# Patient Record
Sex: Female | Born: 1990 | Race: White | Hispanic: No | Smoking: Current every day smoker
Health system: Southern US, Community
[De-identification: ages and names within clinical notes are randomized; demographics above are authoritative.]

## PROBLEM LIST (undated history)

## (undated) ENCOUNTER — Inpatient Hospital Stay (HOSPITAL_COMMUNITY): Payer: Self-pay

## (undated) DIAGNOSIS — F319 Bipolar disorder, unspecified: Secondary | ICD-10-CM

## (undated) DIAGNOSIS — K5900 Constipation, unspecified: Secondary | ICD-10-CM

## (undated) DIAGNOSIS — N814 Uterovaginal prolapse, unspecified: Secondary | ICD-10-CM

## (undated) DIAGNOSIS — G8929 Other chronic pain: Secondary | ICD-10-CM

## (undated) DIAGNOSIS — M549 Dorsalgia, unspecified: Secondary | ICD-10-CM

## (undated) DIAGNOSIS — J189 Pneumonia, unspecified organism: Secondary | ICD-10-CM

## (undated) HISTORY — PX: APPENDECTOMY: SHX54

## (undated) HISTORY — PX: TUBAL LIGATION: SHX77

## (undated) HISTORY — PX: ABDOMINAL HYSTERECTOMY: SHX81

## (undated) HISTORY — PX: WISDOM TOOTH EXTRACTION: SHX21

---
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
## 2009-03-18 ENCOUNTER — Emergency Department (HOSPITAL_COMMUNITY): Admission: EM | Admit: 2009-03-18 | Discharge: 2009-03-18 | Payer: Self-pay | Admitting: Emergency Medicine

## 2009-04-07 ENCOUNTER — Emergency Department (HOSPITAL_COMMUNITY): Admission: EM | Admit: 2009-04-07 | Discharge: 2009-04-07 | Payer: Self-pay | Admitting: Emergency Medicine

## 2009-04-29 ENCOUNTER — Emergency Department: Payer: Self-pay | Admitting: Emergency Medicine

## 2009-08-04 ENCOUNTER — Emergency Department (HOSPITAL_COMMUNITY): Admission: EM | Admit: 2009-08-04 | Discharge: 2009-08-04 | Payer: Self-pay | Admitting: Emergency Medicine

## 2009-08-06 ENCOUNTER — Emergency Department (HOSPITAL_COMMUNITY): Admission: EM | Admit: 2009-08-06 | Discharge: 2009-08-06 | Payer: Self-pay | Admitting: Emergency Medicine

## 2009-08-20 ENCOUNTER — Emergency Department (HOSPITAL_COMMUNITY): Admission: EM | Admit: 2009-08-20 | Discharge: 2009-08-20 | Payer: Self-pay | Admitting: Emergency Medicine

## 2009-08-23 ENCOUNTER — Emergency Department (HOSPITAL_COMMUNITY): Admission: EM | Admit: 2009-08-23 | Discharge: 2009-08-24 | Payer: Self-pay | Admitting: Emergency Medicine

## 2009-08-29 ENCOUNTER — Ambulatory Visit: Payer: Self-pay | Admitting: Physician Assistant

## 2009-08-29 ENCOUNTER — Inpatient Hospital Stay (HOSPITAL_COMMUNITY): Admission: AD | Admit: 2009-08-29 | Discharge: 2009-08-29 | Payer: Self-pay | Admitting: Family Medicine

## 2009-08-29 ENCOUNTER — Encounter: Admission: RE | Admit: 2009-08-29 | Discharge: 2009-09-15 | Payer: Self-pay | Admitting: Orthopedic Surgery

## 2009-09-04 ENCOUNTER — Emergency Department (HOSPITAL_COMMUNITY): Admission: EM | Admit: 2009-09-04 | Discharge: 2009-09-04 | Payer: Self-pay | Admitting: Emergency Medicine

## 2009-11-23 ENCOUNTER — Emergency Department (HOSPITAL_COMMUNITY): Admission: EM | Admit: 2009-11-23 | Discharge: 2009-11-24 | Payer: Self-pay | Admitting: Emergency Medicine

## 2009-11-28 ENCOUNTER — Emergency Department: Payer: Self-pay | Admitting: Emergency Medicine

## 2009-12-10 ENCOUNTER — Emergency Department (HOSPITAL_COMMUNITY): Admission: EM | Admit: 2009-12-10 | Discharge: 2009-12-10 | Payer: Self-pay | Admitting: Family Medicine

## 2009-12-17 ENCOUNTER — Emergency Department (HOSPITAL_COMMUNITY): Admission: EM | Admit: 2009-12-17 | Discharge: 2009-12-17 | Payer: Self-pay | Admitting: Family Medicine

## 2010-01-24 ENCOUNTER — Emergency Department: Payer: Self-pay | Admitting: Emergency Medicine

## 2010-02-06 ENCOUNTER — Emergency Department: Payer: Self-pay | Admitting: Emergency Medicine

## 2010-04-18 ENCOUNTER — Emergency Department (HOSPITAL_COMMUNITY): Admission: EM | Admit: 2010-04-18 | Discharge: 2010-04-18 | Payer: Self-pay | Admitting: Emergency Medicine

## 2010-09-10 ENCOUNTER — Inpatient Hospital Stay (HOSPITAL_COMMUNITY): Payer: Medicaid Other

## 2010-09-10 ENCOUNTER — Inpatient Hospital Stay (HOSPITAL_COMMUNITY)
Admission: AD | Admit: 2010-09-10 | Discharge: 2010-09-10 | Disposition: A | Discharge status: Home / Self Care | Payer: Medicaid Other | Source: Ambulatory Visit | Attending: Obstetrics and Gynecology | Admitting: Obstetrics and Gynecology

## 2010-09-10 DIAGNOSIS — O210 Mild hyperemesis gravidarum: Secondary | ICD-10-CM | POA: Insufficient documentation

## 2010-09-10 DIAGNOSIS — R109 Unspecified abdominal pain: Secondary | ICD-10-CM

## 2010-09-10 LAB — URINALYSIS, ROUTINE W REFLEX MICROSCOPIC
Bilirubin Urine: NEGATIVE
Hgb urine dipstick: NEGATIVE
Protein, ur: NEGATIVE mg/dL
Urobilinogen, UA: 0.2 mg/dL (ref 0.0–1.0)
pH: 6.5 (ref 5.0–8.0)

## 2010-09-10 LAB — CBC
Hemoglobin: 13.3 g/dL (ref 12.0–15.0)
MCHC: 34 g/dL (ref 30.0–36.0)
Platelets: 195 10*3/uL (ref 150–400)
RDW: 12.4 % (ref 11.5–15.5)

## 2010-09-10 LAB — WET PREP, GENITAL
Clue Cells Wet Prep HPF POC: NONE SEEN
Trich, Wet Prep: NONE SEEN

## 2010-09-10 LAB — HCG, QUANTITATIVE, PREGNANCY: hCG, Beta Chain, Quant, S: 68315 m[IU]/mL — ABNORMAL HIGH (ref <5)

## 2010-09-10 LAB — ABO/RH: ABO/RH(D): O POS

## 2010-10-11 LAB — URINALYSIS, ROUTINE W REFLEX MICROSCOPIC
Bilirubin Urine: NEGATIVE
Protein, ur: NEGATIVE mg/dL

## 2010-10-11 LAB — WET PREP, GENITAL: Yeast Wet Prep HPF POC: NONE SEEN

## 2010-10-11 LAB — POCT I-STAT, CHEM 8
Glucose, Bld: 110 mg/dL — ABNORMAL HIGH (ref 70–99)
Sodium: 141 mEq/L (ref 135–145)
TCO2: 26 mmol/L (ref 0–100)

## 2010-10-11 LAB — POCT PREGNANCY, URINE: Preg Test, Ur: NEGATIVE

## 2010-10-11 LAB — GC/CHLAMYDIA PROBE AMP, GENITAL: GC Probe Amp, Genital: NEGATIVE

## 2010-10-14 LAB — COMPREHENSIVE METABOLIC PANEL
CO2: 26 mEq/L (ref 19–32)
Chloride: 106 mEq/L (ref 96–112)
GFR calc Af Amer: >60 mL/min (ref >60)
GFR calc non Af Amer: >60 mL/min (ref >60)
Glucose, Bld: 91 mg/dL (ref 70–99)
Sodium: 139 mEq/L (ref 135–145)
Total Bilirubin: 0.4 mg/dL (ref 0.3–1.2)
Total Protein: 6.1 g/dL (ref 6.0–8.3)

## 2010-10-14 LAB — DIFFERENTIAL
Lymphocytes Relative: 25 % (ref 12–46)
Lymphs Abs: 1.8 10*3/uL (ref 0.7–4.0)
Monocytes Absolute: 0.6 10*3/uL (ref 0.1–1.0)
Neutro Abs: 4.8 10*3/uL (ref 1.7–7.7)
Neutrophils Relative %: 65 % (ref 43–77)

## 2010-10-14 LAB — CBC: Hemoglobin: 12.7 g/dL (ref 12.0–15.0)

## 2010-10-14 LAB — HERPES SIMPLEX VIRUS CULTURE

## 2010-10-14 LAB — LIPASE, BLOOD: Lipase: 25 U/L (ref 11–59)

## 2010-10-14 LAB — D-DIMER, QUANTITATIVE: D-Dimer, Quant: 0.23 ug/mL-FEU (ref 0.00–0.48)

## 2010-10-15 LAB — POCT URINALYSIS DIP (DEVICE)
Bilirubin Urine: NEGATIVE
Glucose, UA: NEGATIVE mg/dL
Hgb urine dipstick: NEGATIVE
Ketones, ur: NEGATIVE mg/dL
Specific Gravity, Urine: 1.025 (ref 1.005–1.030)

## 2010-10-17 LAB — URINALYSIS, ROUTINE W REFLEX MICROSCOPIC
Glucose, UA: NEGATIVE mg/dL
Hgb urine dipstick: NEGATIVE
Ketones, ur: NEGATIVE mg/dL
Protein, ur: NEGATIVE mg/dL
pH: 6.5 (ref 5.0–8.0)

## 2010-10-17 LAB — DIFFERENTIAL
Eosinophils Absolute: 0.4 10*3/uL (ref 0.0–0.7)
Eosinophils Relative: 8 % — ABNORMAL HIGH (ref 0–5)
Lymphs Abs: 2 10*3/uL (ref 0.7–4.0)
Monocytes Relative: 9 % (ref 3–12)

## 2010-10-17 LAB — CBC
HCT: 37.6 % (ref 36.0–46.0)
Hemoglobin: 13 g/dL (ref 12.0–15.0)
MCV: 88.1 fL (ref 78.0–100.0)
RBC: 4.27 MIL/uL (ref 3.87–5.11)
WBC: 5.6 10*3/uL (ref 4.0–10.5)

## 2010-10-17 LAB — PREGNANCY, URINE: Preg Test, Ur: NEGATIVE

## 2010-10-18 LAB — URINE CULTURE: Colony Count: 5000

## 2010-10-18 LAB — URINALYSIS, ROUTINE W REFLEX MICROSCOPIC
Bilirubin Urine: NEGATIVE
Glucose, UA: NEGATIVE mg/dL
Ketones, ur: NEGATIVE mg/dL
Protein, ur: 30 mg/dL — AB

## 2010-10-18 LAB — WET PREP, GENITAL
Trich, Wet Prep: NONE SEEN
Yeast Wet Prep HPF POC: NONE SEEN

## 2010-10-18 LAB — URINE MICROSCOPIC-ADD ON

## 2010-10-18 LAB — GC/CHLAMYDIA PROBE AMP, GENITAL: Chlamydia, DNA Probe: NEGATIVE

## 2010-11-03 LAB — COMPREHENSIVE METABOLIC PANEL
ALT: 40 U/L — ABNORMAL HIGH (ref 0–35)
Alkaline Phosphatase: 72 U/L (ref 39–117)
BUN: 8 mg/dL (ref 6–23)
Chloride: 108 mEq/L (ref 96–112)
Glucose, Bld: 109 mg/dL — ABNORMAL HIGH (ref 70–99)
Potassium: 3.8 mEq/L (ref 3.5–5.1)
Sodium: 139 mEq/L (ref 135–145)
Total Bilirubin: 0.5 mg/dL (ref 0.3–1.2)
Total Protein: 6.5 g/dL (ref 6.0–8.3)

## 2010-11-03 LAB — URINALYSIS, ROUTINE W REFLEX MICROSCOPIC
Bilirubin Urine: NEGATIVE
Bilirubin Urine: NEGATIVE
Glucose, UA: NEGATIVE mg/dL
Ketones, ur: NEGATIVE mg/dL
Nitrite: NEGATIVE
Protein, ur: NEGATIVE mg/dL
Protein, ur: 30 mg/dL — AB
Specific Gravity, Urine: 1.018 (ref 1.005–1.030)
Urobilinogen, UA: 0.2 mg/dL (ref 0.0–1.0)
Urobilinogen, UA: 0.2 mg/dL (ref 0.0–1.0)

## 2010-11-03 LAB — DIFFERENTIAL
Basophils Absolute: 0 10*3/uL (ref 0.0–0.1)
Basophils Relative: 1 % (ref 0–1)
Eosinophils Absolute: 0.4 10*3/uL (ref 0.0–0.7)
Monocytes Absolute: 0.5 10*3/uL (ref 0.1–1.0)
Monocytes Relative: 7 % (ref 3–12)
Neutro Abs: 5 10*3/uL (ref 1.7–7.7)
Neutrophils Relative %: 68 % (ref 43–77)

## 2010-11-03 LAB — CBC
HCT: 37.7 % (ref 36.0–46.0)
Hemoglobin: 12.7 g/dL (ref 12.0–15.0)
RBC: 4.31 MIL/uL (ref 3.87–5.11)
WBC: 7.4 10*3/uL (ref 4.0–10.5)

## 2010-11-03 LAB — URINE MICROSCOPIC-ADD ON

## 2010-11-21 ENCOUNTER — Emergency Department (HOSPITAL_COMMUNITY): Payer: Medicaid Other

## 2010-11-21 ENCOUNTER — Emergency Department (HOSPITAL_COMMUNITY)
Admission: EM | Admit: 2010-11-21 | Discharge: 2010-11-21 | Disposition: A | Discharge status: Home / Self Care | Payer: Medicaid Other | Attending: Emergency Medicine | Admitting: Emergency Medicine

## 2010-11-21 DIAGNOSIS — F319 Bipolar disorder, unspecified: Secondary | ICD-10-CM | POA: Insufficient documentation

## 2010-11-21 DIAGNOSIS — N949 Unspecified condition associated with female genital organs and menstrual cycle: Secondary | ICD-10-CM | POA: Insufficient documentation

## 2010-11-21 DIAGNOSIS — A499 Bacterial infection, unspecified: Secondary | ICD-10-CM | POA: Insufficient documentation

## 2010-11-21 DIAGNOSIS — N760 Acute vaginitis: Secondary | ICD-10-CM | POA: Insufficient documentation

## 2010-11-21 DIAGNOSIS — O2390 Unspecified genitourinary tract infection in pregnancy, unspecified trimester: Secondary | ICD-10-CM | POA: Insufficient documentation

## 2010-11-21 DIAGNOSIS — R109 Unspecified abdominal pain: Secondary | ICD-10-CM | POA: Insufficient documentation

## 2010-11-21 DIAGNOSIS — B9689 Other specified bacterial agents as the cause of diseases classified elsewhere: Secondary | ICD-10-CM | POA: Insufficient documentation

## 2010-11-21 LAB — URINE MICROSCOPIC-ADD ON

## 2010-11-21 LAB — POCT PREGNANCY, URINE: Preg Test, Ur: POSITIVE

## 2010-11-21 LAB — URINALYSIS, ROUTINE W REFLEX MICROSCOPIC
Glucose, UA: NEGATIVE mg/dL
Nitrite: NEGATIVE
Specific Gravity, Urine: 1.02 (ref 1.005–1.030)
pH: 7.5 (ref 5.0–8.0)

## 2010-11-21 LAB — GC/CHLAMYDIA PROBE AMP, GENITAL
Chlamydia, DNA Probe: NEGATIVE
GC Probe Amp, Genital: NEGATIVE

## 2010-11-21 LAB — WET PREP, GENITAL: Trich, Wet Prep: NONE SEEN

## 2010-11-22 LAB — URINE CULTURE: Culture  Setup Time: 201204250939

## 2010-11-27 ENCOUNTER — Inpatient Hospital Stay (HOSPITAL_COMMUNITY)
Admission: AD | Admit: 2010-11-27 | Discharge: 2010-11-27 | Disposition: A | Discharge status: Home / Self Care | Payer: Medicaid Other | Source: Ambulatory Visit | Attending: Obstetrics and Gynecology | Admitting: Obstetrics and Gynecology

## 2010-11-27 ENCOUNTER — Inpatient Hospital Stay (HOSPITAL_COMMUNITY): Payer: Medicaid Other

## 2010-11-27 DIAGNOSIS — R109 Unspecified abdominal pain: Secondary | ICD-10-CM | POA: Insufficient documentation

## 2010-11-27 DIAGNOSIS — O99891 Other specified diseases and conditions complicating pregnancy: Secondary | ICD-10-CM | POA: Insufficient documentation
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
## 2011-05-02 ENCOUNTER — Emergency Department (HOSPITAL_COMMUNITY)
Admission: EM | Admit: 2011-05-02 | Discharge: 2011-05-02 | Disposition: A | Discharge status: Home / Self Care | Payer: Medicaid Other | Attending: Emergency Medicine | Admitting: Emergency Medicine

## 2011-05-02 ENCOUNTER — Emergency Department (HOSPITAL_COMMUNITY): Payer: Medicaid Other

## 2011-05-02 DIAGNOSIS — B9789 Other viral agents as the cause of diseases classified elsewhere: Secondary | ICD-10-CM | POA: Insufficient documentation

## 2011-05-02 DIAGNOSIS — F319 Bipolar disorder, unspecified: Secondary | ICD-10-CM | POA: Insufficient documentation

## 2011-05-02 DIAGNOSIS — IMO0001 Reserved for inherently not codable concepts without codable children: Secondary | ICD-10-CM | POA: Insufficient documentation

## 2011-05-02 DIAGNOSIS — R509 Fever, unspecified: Secondary | ICD-10-CM | POA: Insufficient documentation

## 2011-05-02 LAB — URINALYSIS, ROUTINE W REFLEX MICROSCOPIC
Bilirubin Urine: NEGATIVE
Nitrite: NEGATIVE
Specific Gravity, Urine: 1.014 (ref 1.005–1.030)
pH: 7.5 (ref 5.0–8.0)

## 2011-05-02 LAB — POCT PREGNANCY, URINE: Preg Test, Ur: NEGATIVE

## 2011-05-02 LAB — URINE MICROSCOPIC-ADD ON

## 2011-05-03 ENCOUNTER — Emergency Department (HOSPITAL_COMMUNITY)
Admission: EM | Admit: 2011-05-03 | Discharge: 2011-05-04 | Disposition: A | Discharge status: Home / Self Care | Payer: Medicaid Other | Attending: Emergency Medicine | Admitting: Emergency Medicine

## 2011-05-03 DIAGNOSIS — F319 Bipolar disorder, unspecified: Secondary | ICD-10-CM | POA: Insufficient documentation

## 2011-05-03 DIAGNOSIS — A779 Spotted fever, unspecified: Secondary | ICD-10-CM | POA: Insufficient documentation

## 2011-05-03 DIAGNOSIS — R21 Rash and other nonspecific skin eruption: Secondary | ICD-10-CM | POA: Insufficient documentation

## 2011-09-16 IMAGING — CR DG CHEST 2V
2 series · 2 of 2 positions shown · non-contrast
Comparison: 03/18/2009

CLINICAL DATA: Coughing up blood, rib pain

CHEST - 2 VIEW

[w chest pa]
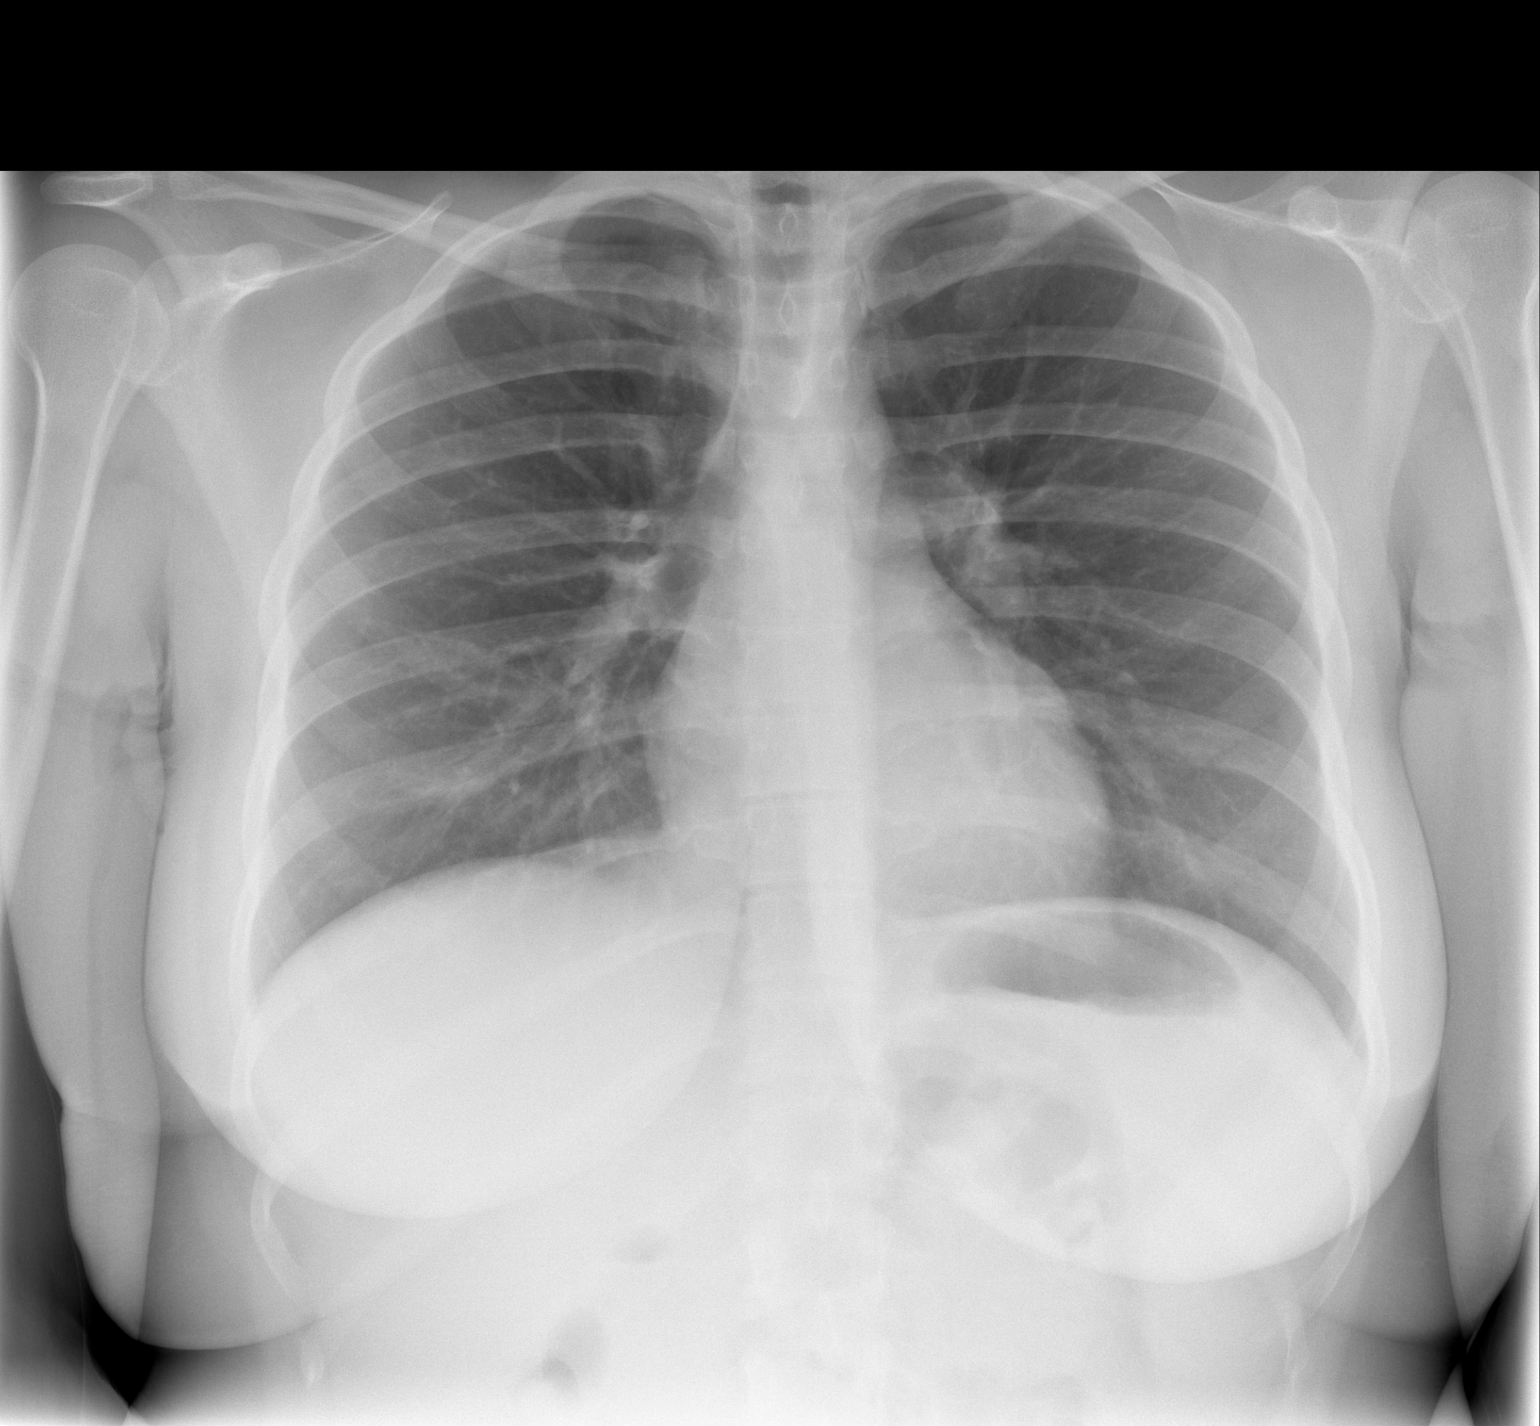

[w chest lat]
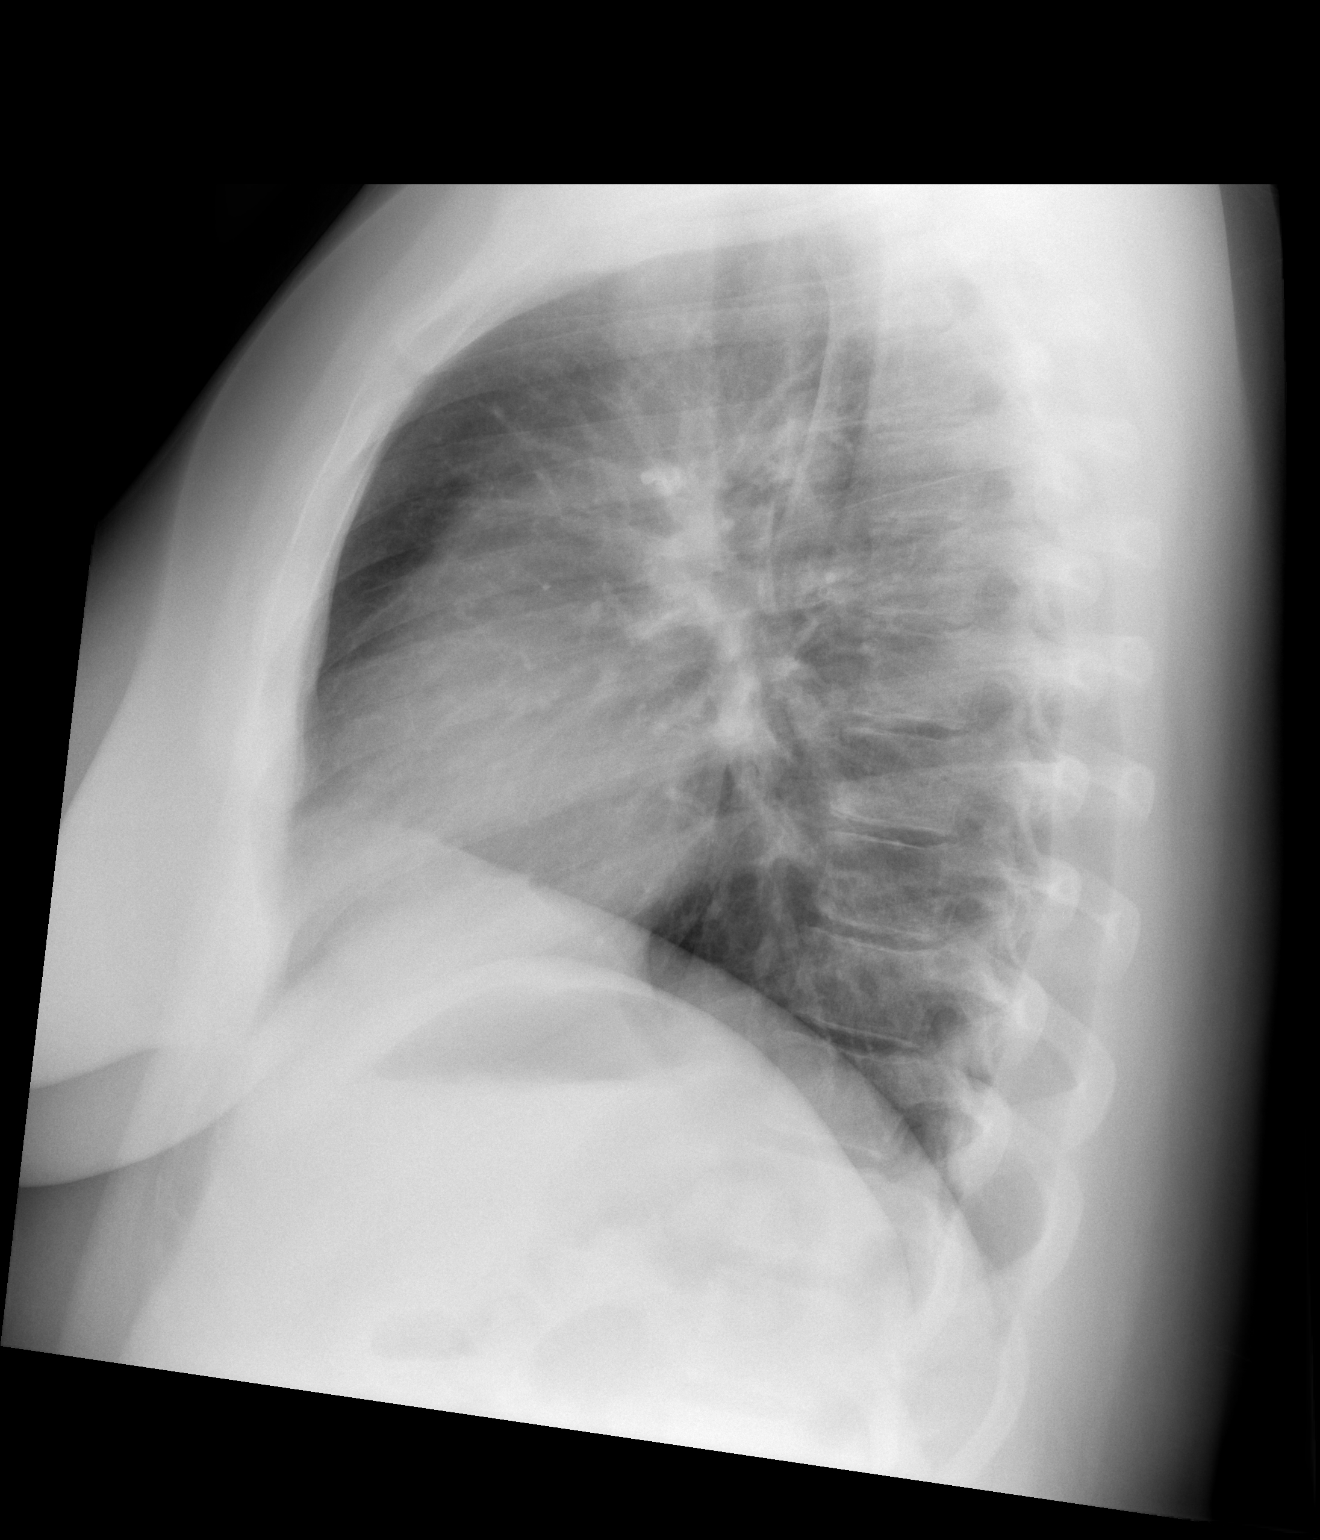

[2 of 2 positions shown; findings below may reference images not displayed]

FINDINGS: The cardiac and mediastinal silhouette appear
unremarkable.  The lung fields are clear.  There is no bony
abnormality.  There is no change from priors.
IMPRESSION: No active cardiopulmonary disease.

## 2012-01-02 ENCOUNTER — Emergency Department (HOSPITAL_COMMUNITY)
Admission: EM | Admit: 2012-01-02 | Discharge: 2012-01-03 | Disposition: A | Discharge status: Home / Self Care | Payer: Medicaid Other | Attending: Emergency Medicine | Admitting: Emergency Medicine

## 2012-01-02 ENCOUNTER — Encounter (HOSPITAL_COMMUNITY): Payer: Self-pay | Admitting: Emergency Medicine

## 2012-01-02 DIAGNOSIS — Y92410 Unspecified street and highway as the place of occurrence of the external cause: Secondary | ICD-10-CM | POA: Insufficient documentation

## 2012-01-02 DIAGNOSIS — M25519 Pain in unspecified shoulder: Secondary | ICD-10-CM | POA: Insufficient documentation

## 2012-01-02 DIAGNOSIS — G8929 Other chronic pain: Secondary | ICD-10-CM | POA: Insufficient documentation

## 2012-01-02 DIAGNOSIS — T148XXA Other injury of unspecified body region, initial encounter: Secondary | ICD-10-CM

## 2012-01-02 DIAGNOSIS — F319 Bipolar disorder, unspecified: Secondary | ICD-10-CM | POA: Insufficient documentation

## 2012-01-02 DIAGNOSIS — M25529 Pain in unspecified elbow: Secondary | ICD-10-CM | POA: Insufficient documentation

## 2012-01-02 DIAGNOSIS — E119 Type 2 diabetes mellitus without complications: Secondary | ICD-10-CM | POA: Insufficient documentation

## 2012-01-02 HISTORY — DX: Dorsalgia, unspecified: M54.9

## 2012-01-02 HISTORY — DX: Other chronic pain: G89.29

## 2012-01-02 HISTORY — DX: Bipolar disorder, unspecified: F31.9
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
## 2012-01-03 ENCOUNTER — Emergency Department (HOSPITAL_COMMUNITY): Payer: No Typology Code available for payment source

## 2012-01-03 MED ORDER — OXYCODONE-ACETAMINOPHEN 5-325 MG PO TABS
1.0000 | ORAL_TABLET | Freq: Once | ORAL | Status: AC
  Administered 2012-01-03: 1 via ORAL
  Filled 2012-01-03: qty 1

## 2012-01-03 MED ORDER — IBUPROFEN 200 MG PO TABS
400.0000 mg | ORAL_TABLET | Freq: Once | ORAL | Status: AC
  Administered 2012-01-03: 400 mg via ORAL
  Filled 2012-01-03: qty 2
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
## 2012-01-03 NOTE — ED Notes (Signed)
Patient transported to X-ray, will adminster meds on return

## 2012-01-03 NOTE — Discharge Instructions (Signed)
Take tylenol or ibuprofen as needed for pain. Muscle Strain A muscle strain (pulled muscle) happens when a muscle is over-stretched. Recovery usually takes 5 to 6 weeks.  HOME CARE   Put ice on the injured area.   Put ice in a plastic bag.   Place a towel between your skin and the bag.   Leave the ice on for 15 to 20 minutes at a time, every hour for the first 2 days.   Do not use the muscle for several days or until your doctor says you can. Do not use the muscle if you have pain.   Wrap the injured area with an elastic bandage for comfort. Do not put it on too tightly.   Only take medicine as told by your doctor.   Warm up before exercise. This helps prevent muscle strains.  GET HELP RIGHT AWAY IF:  There is increased pain or puffiness (swelling) in the affected area. MAKE SURE YOU:   Understand these instructions.   Will watch your condition.   Will get help right away if you are not doing well or get worse.  Document Released: 04/23/2008 Document Revised: 07/04/2011 Document Reviewed: 04/23/2008 [facility] Patient Information 2012 [place], [place].  RESOURCE GUIDE  Chronic Pain Problems: Contact [facility]  [phone] Patients need to be referred by their primary care doctor.  Insufficient Money for Medicine: Contact United Way:  call "211" or [facility] 607-182-4170.  No Primary Care Doctor: - Call Health Connect  [phone] - can help you locate a primary care doctor that  accepts your insurance, provides certain services, etc. - Physician Referral [phone]  Agencies that provide inexpensive medical care: - [facility]  [phone] - [facility]  [phone] - [facility]  [phone] [facility]  [phone] - Planned Parenthood  [phone] [facility]  [phone]  Medicaid-accepting [facility] Providers: - [facility]- [address]  [phone], Mon-Fri 9am-7pm, Sat 9am-1pm - [facility]- [address] [place], Suite [place]  [phone] - [facility]- [address] [place]  [phone] [facility]- [address]  [phone] - Renaye Rakers- [address] [place], [address], [phone]  Only accepts [place] patients after they have their name  applied to their card  Self Pay (no insurance) in [place]: - Sickle Cell Patients: Dr Willey Blade, [facility]  [address] [place], [phone] - [facility]- [address] [place]  [phone]       [facility] [place]- [address]       -     [facility]- see information above (Speak to [employer] if you do not have insurance)       -  [facility]- [address] [place], [phone]       -  [facility]- [address],  [phone]       -  [facility]- [address] [phone]       -  Dr Julio Sicks-  [address], [address], [place], [phone]       -  [facility]- [address] [phone]       -  [facility]- [address],  [phone], also [address], [phone]       -    [facility]- [address] [place], [phone], 1st & 3rd Saturday   every month, 10am-1pm  1) Find a Doctor and Pay Out of Pocket Although you won't have to find out who is covered by your insurance plan, it is a good idea to ask around and get recommendations. You will then need to call the office and see if the doctor you have chosen will accept you as a new patient and what types of options they offer for patients who are self-pay. Some doctors offer discounts or will set up payment plans for their patients who do not have insurance, but you will need to ask so you aren't surprised when you get to your appointment.  2) Contact Your Local Health Department Not all health departments have  doctors that can see patients for sick visits, but many do, so it is worth a call to see if yours does. If you don't know where your local health department is, you can check in your phone book. The CDC also has a tool to help you locate your state's health department, and many state websites also have listings of all of their local health departments.  3) Find a Walk-in Clinic If your illness is not likely to be very severe or complicated, you may want to try a walk in clinic. These are popping up all over the country in pharmacies, drugstores, and shopping centers. They're usually staffed by nurse practitioners or physician assistants that have been trained to treat common illnesses and complaints. They're usually fairly quick and inexpensive. However, if you have serious medical issues or chronic medical problems, these are probably not your best option  STD Testing - [facility] Department of [facility], STD Clinic, [address] [place], phone [phone] or [phone].  Monday - Friday, call for an appointment. [facility] Department of [employer], STD Clinic, [place] [address], [place], phone [phone] or [phone].  Monday - Friday, call for an appointment.  Abuse/Neglect: [facility] Child Abuse Hotline (312)461-5058 [facility] Child Abuse Hotline 435-044-6143 (After Hours)  Emergency Shelter:  [facility] 928-176-7859  Maternity Homes: - [facility] at [facility] [place] [facility] 437-038-9279 - [facility] [phone]  MRSA Hotline #:   914-842-5064  [facility] Resources  [facility] [place]  United Way [facility] Dept. [address]                 [address]         [address] [place] [address]  [place]                                               Cristobal Goldmann Phone:  [phone]                                  Phone:   [phone]  Phone:  [phone]  [facility], [phone] - [phone]       -     [facility] in [place], [address]                                  [phone], [facility] Child Abuse Hotline 830-663-0123 or 223-129-8076 (After Hours)   Behavioral Health Services  Substance Abuse Resources: - Alcohol and Drug Services  2251121377 - [facility] [phone] - [facility] [place] [phone] Floydene Flock [phone] - Residential & Outpatient Substance Abuse Program  [phone]  Psychological Services: [facility]  [phone] [facility] Services  [phone] - [facility], [phone] [place]. [address], [place], ACCESS LINE: 765-041-3383 or 813-675-6155, [email or web address]  Dental Assistance  If unable to pay or uninsured, contact:  [facility] or [facility]. to become qualified for the adult dental clinic.  Patients with Medicaid: [facility] [phone] [address], [phone] [address], [phone]  If unable to pay, or uninsured, contact [facility] [phone]) or [facility] Department [phone] in [place], [phone] in [facility]) to become qualified for the adult dental clinic  Other Low-Cost Community Dental Services: - [facility]- [address] [place], [place], [place], [address], [phone], Ext. [phone], 2nd and 4th Thursday of the month at 6:30am.  10 clients each day by appointment, can sometimes see walk-in patients if someone does not show for an appointment. [facility]- [address] Ether Griffins [place], [place], [address], [phone] - [facility]- [address], [place], [place], [address], [phone] - [facility]- [phone] [facility]  Department- [phone] [facility] Department- [phone]

## 2012-01-08 ENCOUNTER — Emergency Department (HOSPITAL_COMMUNITY)
Admission: EM | Admit: 2012-01-08 | Discharge: 2012-01-08 | Disposition: A | Discharge status: Home / Self Care | Payer: Self-pay | Attending: Emergency Medicine | Admitting: Emergency Medicine

## 2012-01-08 ENCOUNTER — Encounter (HOSPITAL_COMMUNITY): Payer: Self-pay | Admitting: Emergency Medicine

## 2012-01-08 DIAGNOSIS — M549 Dorsalgia, unspecified: Secondary | ICD-10-CM | POA: Insufficient documentation

## 2012-01-08 DIAGNOSIS — F319 Bipolar disorder, unspecified: Secondary | ICD-10-CM | POA: Insufficient documentation

## 2012-01-08 DIAGNOSIS — M79606 Pain in leg, unspecified: Secondary | ICD-10-CM

## 2012-01-08 DIAGNOSIS — M79609 Pain in unspecified limb: Secondary | ICD-10-CM | POA: Insufficient documentation

## 2012-01-08 DIAGNOSIS — F17200 Nicotine dependence, unspecified, uncomplicated: Secondary | ICD-10-CM | POA: Insufficient documentation

## 2012-01-08 DIAGNOSIS — E119 Type 2 diabetes mellitus without complications: Secondary | ICD-10-CM | POA: Insufficient documentation

## 2012-01-08 DIAGNOSIS — G8929 Other chronic pain: Secondary | ICD-10-CM | POA: Insufficient documentation

## 2012-01-08 LAB — POCT I-STAT, CHEM 8
Calcium, Ion: 1.24 mmol/L (ref 1.12–1.32)
Chloride: 103 mEq/L (ref 96–112)
HCT: 43 % (ref 36.0–46.0)
TCO2: 24 mmol/L (ref 0–100)

## 2012-01-08 MED ORDER — HYDROCODONE-ACETAMINOPHEN 5-325 MG PO TABS
1.0000 | ORAL_TABLET | Freq: Once | ORAL | Status: AC
  Administered 2012-01-08: 1 via ORAL
  Filled 2012-01-08: qty 1

## 2012-01-08 MED ORDER — HYDROCODONE-ACETAMINOPHEN 5-325 MG PO TABS: 1.0000 | ORAL_TABLET | Freq: Four times a day (QID) | ORAL | Status: DC | PRN

## 2012-01-08 NOTE — ED Provider Notes (Signed)
History     CSN: [id number]  Arrival date & time 01/08/12  0250   First MD Initiated Contact with Patient 01/08/12 [phone]      Chief Complaint  Patient presents with  . Leg Pain    HPI  History provided by the patient. Patient is a 21 year old female with history of bipolar disorder and chronic back pains who presents with complaints of bilateral lower extremity pains that began last evening. Pain came on acutely and has gradually worsened. Pain is described as an aching throbbing pain that begins above the knees and travels down to the feet. Patient denies any numbness or tingling sensations. She denies any weakness in legs. Patient denies having similar symptoms previously. She denies any aggravating or alleviating factors. Pain is not better at rest and not made worse with any movements or walking. Patient denies any skin changes or rash. Patient denies any fever, chills, sweats.   Past Medical History  Diagnosis Date  . Diabetes mellitus   . Bipolar 1 disorder   . Back pain, chronic     Past Surgical History  Procedure Date  . Appendectomy     No family history on file.  History  Substance Use Topics  . Smoking status: Current Everyday Smoker  . Smokeless tobacco: Not on file  . Alcohol Use: No    OB History    Grav Para Term Preterm Abortions TAB SAB Ect Mult Living                  Review of Systems  Constitutional: Negative for fever and chills.  Gastrointestinal: Negative for nausea, vomiting and abdominal pain.  Musculoskeletal: Negative for back pain.  Neurological: Negative for weakness and numbness.    Allergies  Review of patient's allergies indicates no known allergies.  Home Medications   Current Outpatient Rx  Name Route Sig Dispense Refill  . CITALOPRAM HYDROBROMIDE 40 MG PO TABS Oral Take 40 mg by mouth at bedtime.    . TRAZODONE HCL 100 MG PO TABS Oral Take 50-100 mg by mouth 2 (two) times daily as needed. For insomnia      BP 112/64   Pulse 77  Temp 98.4 F (36.9 C) (Oral)  Resp 17  Ht 5\' 3"  (1.6 m)  Wt 200 lb (90.719 kg)  BMI 35.43 kg/m2  SpO2 98%  LMP 12/13/2011  Physical Exam  Nursing note and vitals reviewed. Constitutional: She is oriented to person, place, and time. She appears well-developed and well-nourished. No distress.  HENT:  Head: Normocephalic.  Cardiovascular: Normal rate and regular rhythm.   Pulmonary/Chest: Effort normal and breath sounds normal. No respiratory distress. She has no wheezes. She has no rales.  Musculoskeletal: Normal range of motion. She exhibits no edema.       Mild tenderness along bilateral lower extremities and dorsal feet. No swelling present. Normal sensations and dorsal pedal pulses bilaterally.  Neurological: She is alert and oriented to person, place, and time.  Skin: Skin is warm and dry.  Psychiatric: She has a normal mood and affect. Her behavior is normal.    ED Course  Procedures  Results for orders placed during the hospital encounter of 01/08/12  POCT I-STAT, CHEM 8      Component Value Range   Sodium 139  135 - 145 mEq/L   Potassium 4.0  3.5 - 5.1 mEq/L   Chloride 103  96 - 112 mEq/L   BUN 11  6 - 23 mg/dL   Creatinine,  Ser 0.90  0.50 - 1.10 mg/dL   Glucose, Bld 161 (*) 70 - 99 mg/dL   Calcium, Ion 0.96  0.45 - 1.32 mmol/L   TCO2 24  0 - 100 mmol/L   Hemoglobin 14.6  12.0 - 15.0 g/dL   HCT 40.9  81.1 - 91.4 %       1. Lower extremity pain       MDM  Patient seen and evaluated. Patient no acute distress.        Angus Seller, [place] 01/09/12 [phone]

## 2012-01-08 NOTE — ED Notes (Signed)
Patient is AOx4 and comfortable with her discharge instructions. 

## 2012-01-08 NOTE — Discharge Instructions (Signed)
You were seen and evaluated for your complaints lower leg pain. Your blood tests do not show any concerns of your electrolytes. At this time your providers feel you may return home and rest or legs. Please followup with the primary care provider for continued evaluation and treatment.   Pain of Unknown Etiology (Pain Without a Known Cause) You have come to your caregiver because of pain. Pain can occur in any part of the body. Often there is not a definite cause. If your laboratory (blood or urine) work was normal and x-rays or other studies were normal, your caregiver may treat you without knowing the cause of the pain. An example of this is the headache. Most headaches are diagnosed by taking a history. This means your caregiver asks you questions about your headaches. Your caregiver determines a treatment based on your answers. Usually testing done for headaches is normal. Often testing is not done unless there is no response to medications. Regardless of where your pain is located today, you can be given medications to make you comfortable. If no physical cause of pain can be found, most cases of pain will gradually leave as suddenly as they came.  If you have a painful condition and no reason can be found for the pain, It is importantthat you follow up with your caregiver. If the pain becomes worse or does not go away, it may be necessary to repeat tests and look further for a possible cause.  Only take over-the-counter or prescription medicines for pain, discomfort, or fever as directed by your caregiver.   For the protection of your privacy, test results can not be given over the phone. Make sure you receive the results of your test. Ask as to how these results are to be obtained if you have not been informed. It is your responsibility to obtain your test results.   You may continue all activities unless the activities cause more pain. When the pain lessens, it is important to gradually resume  normal activities. Resume activities by beginning slowly and gradually increasing the intensity and duration of the activities or exercise. During periods of severe pain, bed-rest may be helpful. Lay or sit in any position that is comfortable.   Ice used for acute (sudden) conditions may be effective. Use a large plastic bag filled with ice and wrapped in a towel. This may provide pain relief.   See your caregiver for continued problems. They can help or refer you for exercises or physical therapy if necessary.  If you were given medications for your condition, do not drive, operate machinery or power tools, or sign legal documents for 24 hours. Do not drink alcohol, take sleeping pills, or take other medications that may interfere with treatment. See your caregiver immediately if you have pain that is becoming worse and not relieved by medications. Document Released: 04/09/2001 Document Revised: 07/04/2011 Document Reviewed: 07/15/2005 [facility] Patient Information 2012 [place], [place].    RESOURCE GUIDE  Chronic Pain Problems: Contact [facility]  [phone] Patients need to be referred by their primary care doctor.  Insufficient Money for Medicine: Contact United Way:  call "211" or [facility] (559)[phone].  No Primary Care Doctor: - Call Health Connect  [phone] - can help you locate a primary care doctor that  accepts your insurance, provides certain services, etc. - Physician Referral [phone]  Agencies that provide inexpensive medical care: - [facility]  [phone] - [facility]  [phone] - [facility]  [phone] - Women's Clinic  [phone] - Planned Parenthood  [phone] - [facility]  [phone]  Medicaid-accepting [facility] Providers: - [facility]- [address]  [phone], Mon-Fri 9am-7pm, Sat 9am-1pm - [facility]- [address] [place], Suite [place]  [phone] - [facility]- [address], Suite [place]  [phone] [facility]- [address]  [phone] - Renaye Rakers- [address] [place], [address], [phone]  Only accepts [place] patients after they have their name  applied to their card  Self Pay (no insurance) in [place]: - Sickle Cell Patients: Dr Willey Blade, [facility]  [address] [place], [phone] - [facility]- [address] [place]  [phone]       [facility] [place]- [address]       -     [facility]- see information above (Speak to [employer] if you do not have insurance)       -  [facility]- [address] [place], [phone]       -  [facility]- [address] [place],  [phone]       -  [facility]- [address], [phone]       -  Dr Julio Sicks-  [address], [place], [phone]       -  [facility]- [address] [phone]       -  [facility]- [address] [phone], also [address] [phone]       -    [facility]- [address] [place], [phone], 1st & 3rd Saturday   every month, 10am-1pm  1) Find a Doctor and Pay Out of Pocket Although you won't have to find out who is covered by your insurance plan, it is a good idea to ask around and get recommendations. You will then need to call the office and see if the doctor you have chosen will accept you as a new patient and what types of options they offer for patients who are self-pay. Some doctors offer discounts or will set up payment plans for their patients who do not have insurance, but you will need to ask so you aren't surprised when you get to your appointment.  2) Contact Your Local Health Department Not all health departments have doctors that can see patients for sick visits, but many do, so it is worth a  call to see if yours does. If you don't know where your local health department is, you can check in your phone book. The CDC also has a tool to help you locate your state's health department, and many state websites also have listings of all of their local health departments.  3) Find a Walk-in Clinic If your illness is not likely to be very severe or complicated, you may want to try a walk in clinic. These are popping up all over the country in pharmacies, drugstores, and shopping centers. They're usually staffed by nurse practitioners or physician assistants that have been trained to treat common illnesses and complaints. They're usually fairly quick and inexpensive. However, if you have serious medical issues or chronic medical problems, these are probably not your best option  STD Testing - [facility] Department of [facility], STD Clinic, [address] [place], phone [phone] or [phone].  Monday - Friday, call for an appointment. [facility] Department of [employer], STD Clinic, [place] [address], [place], phone [phone] or [phone].  Monday - Friday, call for an appointment.  Abuse/Neglect: [facility] Child Abuse Hotline (743) 426-2845 [facility] Child Abuse Hotline 416-836-6212 (After Hours)  Emergency Shelter:  [facility] 6500539188  Maternity Homes: - [facility] [place] [facility] [phone] - [facility] [phone]  MRSA Hotline #:   8301655322  [facility] Resources  [facility] [place]  [employer] [facility] Dept. [address]                 [address]         [address] [place] [address]  Blondell Reveal Phone:  [phone]                                  Phone:  [phone]                   Phone:  [phone]  [facility] Health,  [phone] - [phone]       -     [facility] in [place], [address],                                  412-193-0368, [facility] Child Abuse Hotline 614-186-2972 or 317-870-9975 (After Hours)   Behavioral Health Services  Substance Abuse Resources: - Alcohol and Drug Services  9042385209 - [facility] 701-329-2708 - [facility] [place] [phone] Floydene Flock (606)[phone] - Residential & Outpatient Substance Abuse Program  [phone]  Psychological Services: [facility]  [phone] Services  [phone] - [facility], [phone] [place]. [address], [place], ACCESS LINE: 973-576-0662 or [phone], [email or web address]  Dental Assistance  If unable to pay or uninsured, contact:  [facility] or [facility] to become qualified for the adult dental clinic.  Patients with Medicaid: [facility] [phone] [address], [phone] [address] [phone]  If unable to pay, or uninsured, contact [facility] [phone]) or [facility] Department [phone] in [place], [phone] in [facility]) to become qualified for the adult dental clinic  Other Low-Cost Community Dental Services: - [facility]- [address] [place], [place], [place], [address], [phone], Ext. [phone], 2nd and 4th Thursday of the month at 6:30am.  10 clients each day by appointment, can sometimes see walk-in patients if someone does not show for an appointment. [facility]- [address] Ether Griffins [place], [place], [address], [phone] - [facility]-  [address] [place], [place], [address], [phone] [facility] Health Department- [phone] [facility] Health Department- [phone] [facility] Department- [phone]

## 2012-01-08 NOTE — ED Notes (Signed)
PT. REPORTS BILATERAL ,LOWER LEG PAIN ONSET THIS EVENING , DENIES INJURY , AMBULATORY.

## 2012-01-10 NOTE — ED Provider Notes (Signed)
Medical screening examination/treatment/procedure(s) were performed by non-physician practitioner and as supervising physician I was immediately available for consultation/collaboration.  Olivia Mackie, MD 01/10/12 [phone]

## 2012-01-16 ENCOUNTER — Encounter (HOSPITAL_COMMUNITY): Payer: Self-pay | Admitting: Emergency Medicine

## 2012-01-16 DIAGNOSIS — A499 Bacterial infection, unspecified: Secondary | ICD-10-CM | POA: Insufficient documentation

## 2012-01-16 DIAGNOSIS — N949 Unspecified condition associated with female genital organs and menstrual cycle: Secondary | ICD-10-CM | POA: Insufficient documentation

## 2012-01-16 DIAGNOSIS — N760 Acute vaginitis: Secondary | ICD-10-CM | POA: Insufficient documentation

## 2012-01-16 DIAGNOSIS — F319 Bipolar disorder, unspecified: Secondary | ICD-10-CM | POA: Insufficient documentation

## 2012-01-16 DIAGNOSIS — R109 Unspecified abdominal pain: Secondary | ICD-10-CM | POA: Insufficient documentation

## 2012-01-16 DIAGNOSIS — E119 Type 2 diabetes mellitus without complications: Secondary | ICD-10-CM | POA: Insufficient documentation

## 2012-01-16 DIAGNOSIS — G8929 Other chronic pain: Secondary | ICD-10-CM | POA: Insufficient documentation

## 2012-01-16 DIAGNOSIS — N946 Dysmenorrhea, unspecified: Secondary | ICD-10-CM | POA: Insufficient documentation

## 2012-01-16 DIAGNOSIS — B9689 Other specified bacterial agents as the cause of diseases classified elsewhere: Secondary | ICD-10-CM | POA: Insufficient documentation

## 2012-01-16 DIAGNOSIS — M549 Dorsalgia, unspecified: Secondary | ICD-10-CM | POA: Insufficient documentation

## 2012-01-16 NOTE — ED Notes (Signed)
PT. REPORTS  VAGINAL BLEEDING AND LOW ABDOMINAL CRAMPING ONSET LAST NIGHT , STATES POSITIVE HOME PREGNANCY TEST LAST WEEK , UNSURE OF LMP " SOMETIME IN April" , G4P2. DENIES FEVER , VOMITING OR DIARRHEA.

## 2012-01-17 ENCOUNTER — Emergency Department (HOSPITAL_COMMUNITY)
Admission: EM | Admit: 2012-01-17 | Discharge: 2012-01-17 | Disposition: A | Discharge status: Home / Self Care | Payer: Self-pay | Attending: Emergency Medicine | Admitting: Emergency Medicine

## 2012-01-17 ENCOUNTER — Emergency Department (HOSPITAL_COMMUNITY): Payer: Self-pay

## 2012-01-17 DIAGNOSIS — N946 Dysmenorrhea, unspecified: Secondary | ICD-10-CM

## 2012-01-17 DIAGNOSIS — N760 Acute vaginitis: Secondary | ICD-10-CM

## 2012-01-17 LAB — DIFFERENTIAL
Basophils Absolute: 0.1 10*3/uL (ref 0.0–0.1)
Basophils Relative: 1 % (ref 0–1)
Neutro Abs: 5 10*3/uL (ref 1.7–7.7)
Neutrophils Relative %: 62 % (ref 43–77)

## 2012-01-17 LAB — BASIC METABOLIC PANEL
Chloride: 105 mEq/L (ref 96–112)
Creatinine, Ser: 0.76 mg/dL (ref 0.50–1.10)
GFR calc Af Amer: >90 mL/min (ref >90)
GFR calc non Af Amer: >90 mL/min (ref >90)
Potassium: 3.9 mEq/L (ref 3.5–5.1)

## 2012-01-17 LAB — CBC
MCHC: 34.3 g/dL (ref 30.0–36.0)
Platelets: 235 10*3/uL (ref 150–400)
RDW: 13 % (ref 11.5–15.5)

## 2012-01-17 LAB — URINALYSIS, ROUTINE W REFLEX MICROSCOPIC
Ketones, ur: NEGATIVE mg/dL
Nitrite: NEGATIVE
Protein, ur: NEGATIVE mg/dL
Urobilinogen, UA: 0.2 mg/dL (ref 0.0–1.0)
pH: 6 (ref 5.0–8.0)

## 2012-01-17 LAB — POCT PREGNANCY, URINE: Preg Test, Ur: NEGATIVE

## 2012-01-17 LAB — WET PREP, GENITAL: Yeast Wet Prep HPF POC: NONE SEEN

## 2012-01-17 MED ORDER — NAPROXEN 250 MG PO TABS: 250.0000 mg | ORAL_TABLET | Freq: Two times a day (BID) | ORAL | Status: DC

## 2012-01-17 MED ORDER — KETOROLAC TROMETHAMINE 60 MG/2ML IM SOLN
60.0000 mg | Freq: Once | INTRAMUSCULAR | Status: AC
  Administered 2012-01-17: 60 mg via INTRAMUSCULAR
  Filled 2012-01-17: qty 2

## 2012-01-17 MED ORDER — OXYCODONE-ACETAMINOPHEN 5-325 MG PO TABS
1.0000 | ORAL_TABLET | Freq: Once | ORAL | Status: AC
  Administered 2012-01-17: 1 via ORAL
  Filled 2012-01-17: qty 1

## 2012-01-17 MED ORDER — HYDROCODONE-ACETAMINOPHEN 5-325 MG PO TABS: ORAL_TABLET | ORAL | Status: AC

## 2012-01-17 MED ORDER — METRONIDAZOLE 500 MG PO TABS: 500.0000 mg | ORAL_TABLET | Freq: Two times a day (BID) | ORAL | Status: AC

## 2012-01-17 NOTE — ED Notes (Signed)
Discharge instructions given. Patient voiced understanding.

## 2012-01-17 NOTE — Discharge Instructions (Signed)
RESOURCE GUIDE  Chronic Pain Problems: Contact [facility]  [phone] Patients need to be referred by their primary care doctor.  Insufficient Money for Medicine: Contact United Way:  call "211" or [facility] [phone].  No Primary Care Doctor: - Call [facility]  [phone] - can help you locate a primary care doctor that  accepts your insurance, provides certain services, etc. - Physician Referral Service- 1-800-533-3463  Agencies that provide inexpensive medical care: - [facility]  [phone] - [facility]  [phone] - [facility]  [phone] - [facility]  [phone] - Planned Parenthood  [phone] - [facility]  [phone]  Medicaid-accepting [place] Providers: - [facility]- [address]  [phone], Mon-Fri 9am-7pm, Sat 9am-1pm - [facility]- [address], [address]  [phone] - [facility]- [address], [address]  [phone] - [facility]- [address] [place] [address]  [phone] - Veita Bland- [address], [phone]  Only accepts [place] Access Medicaid patients after they have their name  applied to their card  Self Pay (no insurance) in [place]: - Sickle Cell Patients: Dr Eric Dean, [facility]  [address], [phone] - [facility]- [address]  [phone]       -     [facility] [place]- [address]       -     [facility]- see information above (Speak to Pam H if you do not have insurance)       -  [facility]- [address], [phone]       -  [facility] [place]- [address],  [phone]       -  [facility]- [address] [place] [address], [phone]       -  Dr Osei-Bonsu-  [address], [place], [phone]       -  [facility]- [address], [phone]       -  [facility]- [address] [place] [address], [phone], also [address], [phone]       -    [facility]- [address], [phone], 1st & 3rd Saturday   every month, 10am-1pm  1) Find a Doctor and Pay Out of Pocket Although you won't have to find out who is covered by your insurance plan, it is a good idea to ask around and get recommendations. You will then need to call the office and see if the doctor you have chosen will accept you as a new patient and what types of options they offer for patients who are self-pay. Some doctors offer discounts or will set up payment plans for their patients who do not have insurance, but you will need to ask so you aren't surprised when you get to your appointment.  2) Contact Your Local Health Department Not all health departments have doctors that can see patients for sick visits, but many do, so it is worth a call to see if yours does. If you don't know where your local health department is, you can check in your phone book. The CDC also has a tool to help you locate your state's health department, and many state websites also have   listings of all of their local health departments.  3) Find a Walk-in Clinic If your illness is not likely to be very severe or complicated, you may want to try a walk in clinic. These are popping up all over the country in pharmacies, drugstores, and shopping centers. They're usually staffed by nurse practitioners or physician assistants that have been trained to treat common illnesses and complaints. They're usually fairly quick and inexpensive. However, if you have serious medical issues or chronic medical problems, these are probably not your best option  STD Testing - [place] [facility] [place], [facility], [address], [place], phone [phone] or [phone].  Monday - Friday, call for an appointment. - [place]  [facility] [place], [facility] Clinic, [address], [place], phone [phone] or [phone].  Monday - Friday, call for an appointment.  Abuse/Neglect: - [place] Child Abuse Hotline (336) 641-3795 - [place] Child Abuse Hotline 800-378-5315 (After Hours)  Emergency Shelter:  [facility] (336) 271-5985  Maternity Homes: - [facility] (336) 275-9566 - [facility] (704) 372-4663  MRSA Hotline #:   832-7006  [place] Resources  [facility] [place]  [employer] [place] [facility] [address]                 [address]         [address]  [place]                                               [place]                              [place] Phone:  [phone]                                  Phone:  [phone]                   Phone:  [phone]  [place] [facility], [phone] - [place] Services - [facility]- 1-888-581-9988       -     [facility] in [place], [address],                                  336-349-4454, Insurance  [place] Child Abuse Hotline (336) 342-1394 or (336) 342-3537 (After Hours)   Behavioral Health Services  Substance Abuse Resources: - [facility]  336-882-2125 - [facility] 336-784-9470 - [facility] 336-285-9073 - [facility] 336-845-3988 - Residential & Outpatient Substance Abuse Program  800-659-3381  Psychological Services: -  Health  [phone] - [facility]  [phone] - [place] [facility], [address], [place], ACCESS LINE: 1-800-853-5163 or 336-641-4981, Http://www.[facility].com/services/adult.htm  Dental Assistance  If unable to pay or uninsured, contact:  [facility] or [place] [facility] to become qualified for the adult dental  clinic.  Patients with Medicaid:  [facility] [address], [phone], [phone]  If unable   to pay, or uninsured, contact [facility] [phone]) or [facility] Department [phone] in [place], [phone] in [facility]) to become qualified for the adult dental clinic  Other Low-Cost Community Dental Services: - [facility]- [address] [place], [place], [place], [address], [phone], [phone], 2nd and 4th Thursday of the month at 6:30am.  10 clients each day by appointment, can sometimes see walk-in patients if someone does not show for an appointment. [facility]- [address] [place], [place], [address], [phone] - [facility]- [address], [place], [place], [address], [phone] [facility]- [phone] [facility]- [phone] [facility] [phone]     Take the prescriptions as directed.  Apply moist heat to the area(s) of discomfort, for 15 minutes at a time, several times per day for the next few days.  Do not fall asleep on a heating pack.  Call your regular OB/GYN doctor today to schedule a follow up appointment within the next week.  Return to the Emergency Department immediately if worsening.

## 2012-01-17 NOTE — ED Provider Notes (Signed)
History     CSN: [id number]  Arrival date & time 01/16/12  2349   First MD Initiated Contact with Patient 01/17/12 0208      Chief Complaint  Patient presents with  . Vaginal Bleeding    HPI Pt was seen at 0255.  Per pt, c/o gradual onset and persistence of constant vaginal bleeding for the past 4 days.  Has been associated with low abd/pelvic "cramping" pain.  Pt states she is unsure if these symptoms are her usual menses, is requesting a pregnancy test.  Denies back/flank pain, no N/V/D, no fevers, no dysuria, no vaginal discharge.    Past Medical History  Diagnosis Date  . Diabetes mellitus   . Bipolar 1 disorder   . Back pain, chronic     Past Surgical History  Procedure Date  . Appendectomy     History  Substance Use Topics  . Smoking status: Current Everyday Smoker  . Smokeless tobacco: Not on file  . Alcohol Use: No    Review of Systems ROS: Statement: All systems negative except as marked or noted in the HPI; Constitutional: Negative for fever and chills. ; ; Eyes: Negative for eye pain, redness and discharge. ; ; ENMT: Negative for ear pain, hoarseness, nasal congestion, sinus pressure and sore throat. ; ; Cardiovascular: Negative for chest pain, palpitations, diaphoresis, dyspnea and peripheral edema. ; ; Respiratory: Negative for cough, wheezing and stridor. ; ; Gastrointestinal: Negative for nausea, vomiting, diarrhea, abdominal pain, blood in stool, hematemesis, jaundice and rectal bleeding. . ; ; Genitourinary: Negative for dysuria, flank pain and hematuria.;  GYN:  +menses/vaginal bleeding, pelvic pain.  No vaginal discharge, no vulvar pain.;; Musculoskeletal: Negative for back pain and neck pain. Negative for swelling and trauma.; ; Skin: Negative for pruritus, rash, abrasions, blisters, bruising and skin lesion.; ; Neuro: Negative for headache, lightheadedness and neck stiffness. Negative for weakness, altered level of consciousness , altered mental status,  extremity weakness, paresthesias, involuntary movement, seizure and syncope.     Allergies  Review of patient's allergies indicates no known allergies.  Home Medications   Current Outpatient Rx  Name Route Sig Dispense Refill  . CITALOPRAM HYDROBROMIDE 40 MG PO TABS Oral Take 40 mg by mouth at bedtime.      BP 118/79  Pulse 83  Temp 98.4 F (36.9 C) (Oral)  Resp 18  SpO2 97%  LMP 12/13/2011  Physical Exam 0300: Physical examination:  Nursing notes reviewed; Vital signs and O2 SAT reviewed;  Constitutional: Well developed, Well nourished, Well hydrated, In no acute distress; Head:  Normocephalic, atraumatic; Eyes: EOMI, PERRL, No scleral icterus; ENMT: Mouth and pharynx normal, Mucous membranes moist; Neck: Supple, Full range of motion, No lymphadenopathy; Cardiovascular: Regular rate and rhythm, No murmur, rub, or gallop; Respiratory: Breath sounds clear & equal bilaterally, No rales, rhonchi, wheezes.  Speaking full sentences with ease, Normal respiratory effort/excursion; Chest: Nontender, Movement normal; Abdomen: Soft, Nontender, Nondistended, Normal bowel sounds; Genitourinary: No CVA tenderness; Pelvic exam performed with permission of pt and female ED tech assist during exam.  External genitalia w/o lesions. Vaginal vault without discharge, small amount of dark blood in vault.  Cervix w/o lesions, not friable, GC/chlam and wet prep obtained and sent to lab.  Bimanual exam w/o CMT, uterine or adnexal tenderness.; Extremities: Pulses normal, No tenderness, No edema, No calf edema or asymmetry.; Neuro: AA&Ox3, Major CN grossly intact.  Speech clear. No gross focal motor or sensory deficits in extremities.; Skin: Color normal, Warm, Dry.  ED Course  Procedures   MDM  MDM Reviewed: nursing note and vitals Interpretation: labs and ultrasound   Results for orders placed during the hospital encounter of 01/17/12  URINALYSIS, ROUTINE W REFLEX MICROSCOPIC      Component Value Range    Color, Urine YELLOW  YELLOW   APPearance CLEAR  CLEAR   Specific Gravity, Urine 1.015  1.005 - 1.030   pH 6.0  5.0 - 8.0   Glucose, UA NEGATIVE  NEGATIVE mg/dL   Hgb urine dipstick LARGE (*) NEGATIVE   Bilirubin Urine NEGATIVE  NEGATIVE   Ketones, ur NEGATIVE  NEGATIVE mg/dL   Protein, ur NEGATIVE  NEGATIVE mg/dL   Urobilinogen, UA 0.2  0.0 - 1.0 mg/dL   Nitrite NEGATIVE  NEGATIVE   Leukocytes, UA TRACE (*) NEGATIVE  CBC      Component Value Range   WBC 8.1  4.0 - 10.5 K/uL   RBC 4.63  3.87 - 5.11 MIL/uL   Hemoglobin 13.7  12.0 - 15.0 g/dL   HCT 16.1  09.6 - 04.5 %   MCV 86.4  78.0 - 100.0 fL   MCH 29.6  26.0 - 34.0 pg   MCHC 34.3  30.0 - 36.0 g/dL   RDW 40.9  81.1 - 91.4 %   Platelets 235  150 - 400 K/uL  DIFFERENTIAL      Component Value Range   Neutrophils Relative 62  43 - 77 %   Neutro Abs 5.0  1.7 - 7.7 K/uL   Lymphocytes Relative 26  12 - 46 %   Lymphs Abs 2.1  0.7 - 4.0 K/uL   Monocytes Relative 7  3 - 12 %   Monocytes Absolute 0.6  0.1 - 1.0 K/uL   Eosinophils Relative 4  0 - 5 %   Eosinophils Absolute 0.3  0.0 - 0.7 K/uL   Basophils Relative 1  0 - 1 %   Basophils Absolute 0.1  0.0 - 0.1 K/uL  BASIC METABOLIC PANEL      Component Value Range   Sodium 140  135 - 145 mEq/L   Potassium 3.9  3.5 - 5.1 mEq/L   Chloride 105  96 - 112 mEq/L   CO2 21  19 - 32 mEq/L   Glucose, Bld 96  70 - 99 mg/dL   BUN 10  6 - 23 mg/dL   Creatinine, Ser 7.82  0.50 - 1.10 mg/dL   Calcium 9.2  8.4 - 95.6 mg/dL   GFR calc non Af Amer >90  >90 mL/min   GFR calc Af Amer >90  >90 mL/min  POCT PREGNANCY, URINE      Component Value Range   Preg Test, Ur NEGATIVE  NEGATIVE  URINE MICROSCOPIC-ADD ON      Component Value Range   Squamous Epithelial / LPF RARE  RARE   WBC, UA 0-2  <3 WBC/hpf   RBC / HPF 7-10  <3 RBC/hpf   Bacteria, UA RARE  RARE  WET PREP, GENITAL      Component Value Range   Yeast Wet Prep HPF POC NONE SEEN  NONE SEEN   Trich, Wet Prep NONE SEEN  NONE SEEN    Clue Cells Wet Prep HPF POC FEW (*) NONE SEEN   WBC, Wet Prep HPF POC TOO NUMEROUS TO COUNT (*) NONE SEEN    US Transvaginal Non-ob 01/17/2012  *RADIOLOGY REPORT*  Clinical Data:  Pelvic pain.  TRANSABDOMINAL AND TRANSVAGINAL ULTRASOUND OF PELVIS DOPPLER ULTRASOUND OF OVARIES  Technique:  Both transabdominal and transvaginal ultrasound examinations of the pelvis were performed. Transabdominal technique was performed for global imaging of the pelvis including uterus, ovaries, adnexal regions, and pelvic cul-de-sac.  It was necessary to proceed with endovaginal exam following the transabdominal exam to visualize the uterus and right ovary in greater detail.  Color and duplex Doppler ultrasound was utilized to evaluate blood flow to the ovaries.  Comparison:  None.  Findings:  Uterus:  Normal in size and appearance; measures 7.3 x 4.1 x 4.4 cm.  Endometrium:  Normal in thickness and appearance; measures 0.5 cm in thickness.  Right ovary: Normal appearance/no adnexal mass; measures 2.4 x 2.0 x 2.4 cm.  Left ovary:   Normal appearance/no adnexal mass; measures 3.2 x 2.0 x 1.9 cm.  Pulsed Doppler evaluation demonstrates normal low-resistance arterial and venous waveforms in both ovaries.  No free fluid is seen within the pelvic cul-de-sac.  IMPRESSION: Normal exam.  No evidence of pelvic mass or other significant abnormality.  No sonographic evidence for ovarian torsion.  Original Report Authenticated By: Tonia Ghent, M.D.      5:32 AM:  Pt has been watching TV and playing on her electronic handheld device most of her ED visit.  NAD, resps easy, VSS.  Feels better after meds and wants to go home now.  Will tx for +BV, GC/chlam is pending.  Dx testing d/w pt.  Questions answered.  Verb understanding, agreeable to d/c home with outpt f/u.           Laray Anger, DO 01/18/12 1609

## 2012-01-18 LAB — GC/CHLAMYDIA PROBE AMP, GENITAL: Chlamydia, DNA Probe: NEGATIVE

## 2012-02-18 ENCOUNTER — Encounter (HOSPITAL_COMMUNITY): Payer: Self-pay | Admitting: *Deleted

## 2012-02-18 ENCOUNTER — Emergency Department (HOSPITAL_COMMUNITY)
Admission: EM | Admit: 2012-02-18 | Discharge: 2012-02-18 | Disposition: A | Discharge status: Home / Self Care | Payer: Self-pay | Attending: Emergency Medicine | Admitting: Emergency Medicine

## 2012-02-18 ENCOUNTER — Emergency Department (HOSPITAL_COMMUNITY): Payer: Self-pay

## 2012-02-18 DIAGNOSIS — R05 Cough: Secondary | ICD-10-CM | POA: Insufficient documentation

## 2012-02-18 DIAGNOSIS — R059 Cough, unspecified: Secondary | ICD-10-CM | POA: Insufficient documentation

## 2012-02-18 DIAGNOSIS — M545 Low back pain, unspecified: Secondary | ICD-10-CM | POA: Insufficient documentation

## 2012-02-18 DIAGNOSIS — F17200 Nicotine dependence, unspecified, uncomplicated: Secondary | ICD-10-CM | POA: Insufficient documentation

## 2012-02-18 DIAGNOSIS — Z9089 Acquired absence of other organs: Secondary | ICD-10-CM | POA: Insufficient documentation

## 2012-02-18 DIAGNOSIS — R079 Chest pain, unspecified: Secondary | ICD-10-CM | POA: Insufficient documentation

## 2012-02-18 DIAGNOSIS — E119 Type 2 diabetes mellitus without complications: Secondary | ICD-10-CM | POA: Insufficient documentation

## 2012-02-18 DIAGNOSIS — J189 Pneumonia, unspecified organism: Secondary | ICD-10-CM

## 2012-02-18 DIAGNOSIS — F319 Bipolar disorder, unspecified: Secondary | ICD-10-CM | POA: Insufficient documentation

## 2012-02-18 MED ORDER — AZITHROMYCIN 250 MG PO TABS: 250.0000 mg | ORAL_TABLET | Freq: Every day | ORAL | Status: AC

## 2012-02-18 MED ORDER — HYDROCODONE-HOMATROPINE 5-1.5 MG/5ML PO SYRP: 5.0000 mL | ORAL_SOLUTION | Freq: Four times a day (QID) | ORAL | Status: AC | PRN

## 2012-02-18 NOTE — ED Notes (Signed)
Rt lateral  Rt rib pain for 8 months.  No known injury.  Low grade temp and she has been coughing productive  Browniish.  lmp July 14th

## 2012-02-18 NOTE — ED Provider Notes (Signed)
History     CSN: [id number]  Arrival date & time 02/18/12  0146   First MD Initiated Contact with Patient 02/18/12 [phone]      Chief Complaint  Patient presents with  . rib pain     (Consider location/radiation/quality/duration/timing/severity/associated sxs/prior treatment) HPI Comments: Patient presents emergency department with a chief complaint of cough and back pain.  Patient states that her cough has been present for approximately 8 days and that it has been productive of brownish sputum.  She also reports a sore throat a low-grade fever right rib pain and lower right back pain.  She denies night sweats, chills, shortness of breath, dyspnea on exertion, chest pain, difficulty swallowing, pleurisy, leg swelling, claudication.  The history is provided by the patient.    Past Medical History  Diagnosis Date  . Diabetes mellitus   . Bipolar 1 disorder   . Back pain, chronic     Past Surgical History  Procedure Date  . Appendectomy     No family history on file.  History  Substance Use Topics  . Smoking status: Current Everyday Smoker  . Smokeless tobacco: Not on file  . Alcohol Use: No    OB History    Grav Para Term Preterm Abortions TAB SAB Ect Mult Living                  Review of Systems  All other systems reviewed and are negative.    Allergies  Review of patient's allergies indicates no known allergies.  Home Medications   Current Outpatient Rx  Name Route Sig Dispense Refill  . ACETAMINOPHEN 325 MG PO TABS Oral Take 650 mg by mouth every 6 (six) hours as needed. For pain    . CITALOPRAM HYDROBROMIDE 40 MG PO TABS Oral Take 40 mg by mouth at bedtime.      BP 137/91  Pulse 97  Temp 98.4 F (36.9 C) (Oral)  Resp 16  SpO2 99%  LMP 02/11/2012  Physical Exam  Nursing note and vitals reviewed. Constitutional: She is oriented to person, place, and time. She appears well-developed and well-nourished. No distress.  HENT:  Head: Normocephalic  and atraumatic.       Tonsillar exudate but no swelling.  Uvula midline.  Airway intact  Eyes: Conjunctivae and EOM are normal.  Neck: Normal range of motion.  Cardiovascular:       Regular rate rhythm, no aberrancy and auscultation  Pulmonary/Chest: Effort normal.       Lungs with Rales at right lower lung base, no wheezing.  Chest tenderness present.  Abdominal:       Obese soft nontender to palpation  Musculoskeletal: Normal range of motion.  Neurological: She is alert and oriented to person, place, and time.  Skin: Skin is warm and dry. No rash noted. She is not diaphoretic.  Psychiatric: She has a normal mood and affect. Her behavior is normal.    ED Course  Procedures (including critical care time)  Labs Reviewed - No data to display Dg Chest 2 View  02/18/2012  *RADIOLOGY REPORT*  Clinical Data: Cough and fever.  CHEST - 2 VIEW  Comparison: 02/13/2012.  Findings: The cardiac silhouette, mediastinal and hilar contours are within normal limits and stable.  Ill-defined density in the right lower lobe suspicious for early or developing pneumonia.  The left lung is clear.  IMPRESSION: Suspect early right lower lobe pneumonia.  Original Report Authenticated By: P. Loralie Champagne, M.D.     No  diagnosis found.    MDM  CAP  Patient has been diagnosed with CAP via chest xray. Pt is not ill appearing, immunocompromised, and does not have multiple co morbidities, therefore I feel like the they can be treated as an OP with abx therapy. Pt has been advised to return to the ED if symptoms worsen or they do not improve. Pt verbalizes understanding and is agreeable with plan.          Jaci Carrel, [place] 02/18/12 [phone]

## 2012-02-18 NOTE — ED Notes (Signed)
Rx x 2, d/c home by self.  Voiced understanding to return for worsening of sx and f/u with PCP

## 2012-02-18 NOTE — ED Notes (Signed)
Pt c/o increasing CP and fevers for the past 8 days.  States pain increases with inspiration and is worse on the right than the left.  Also c/o burning sensation in throat.

## 2012-02-18 NOTE — ED Provider Notes (Signed)
Medical screening examination/treatment/procedure(s) were performed by non-physician practitioner and as supervising physician I was immediately available for consultation/collaboration.   Marigny Borre, MD 02/18/12 0709 

## 2012-02-22 DIAGNOSIS — F17200 Nicotine dependence, unspecified, uncomplicated: Secondary | ICD-10-CM | POA: Insufficient documentation

## 2012-02-22 DIAGNOSIS — E119 Type 2 diabetes mellitus without complications: Secondary | ICD-10-CM | POA: Insufficient documentation

## 2012-02-22 DIAGNOSIS — R21 Rash and other nonspecific skin eruption: Secondary | ICD-10-CM | POA: Insufficient documentation

## 2012-02-22 DIAGNOSIS — G8929 Other chronic pain: Secondary | ICD-10-CM | POA: Insufficient documentation

## 2012-02-22 DIAGNOSIS — F319 Bipolar disorder, unspecified: Secondary | ICD-10-CM | POA: Insufficient documentation

## 2012-02-23 ENCOUNTER — Encounter (HOSPITAL_COMMUNITY): Payer: Self-pay | Admitting: Emergency Medicine

## 2012-02-23 ENCOUNTER — Emergency Department (HOSPITAL_COMMUNITY)
Admission: EM | Admit: 2012-02-23 | Discharge: 2012-02-23 | Disposition: A | Discharge status: Home / Self Care | Payer: Self-pay | Attending: Emergency Medicine | Admitting: Emergency Medicine

## 2012-02-23 DIAGNOSIS — L0390 Cellulitis, unspecified: Secondary | ICD-10-CM

## 2012-02-23 MED ORDER — CLINDAMYCIN HCL 300 MG PO CAPS: 300.0000 mg | ORAL_CAPSULE | Freq: Three times a day (TID) | ORAL | Status: DC

## 2012-02-23 NOTE — ED Notes (Signed)
Pt. Verbalized understanding of dx of Bacterial infection. Pt. Verbalized instructions to take full dose of antibiotic. Instructed on signs and symptoms of worsening infection. Verbalized understanding. Ambulatory. Respirations even and unlabored. Vitals stable. A.0. X 4. NAD. Family at bedside.

## 2012-02-23 NOTE — ED Provider Notes (Signed)
History     CSN: [id number]  Arrival date & time 02/22/12  2357  1:51 AM HPI Pt reports a possible insect bite to her right upper back. Reports back is burning. Denies fever or purulent drainage. Did not visualize insect   Patient is a 21 y.o. female presenting with rash.  Rash  This is a new problem. The current episode started 6 to 12 hours ago. The problem has been gradually worsening. The problem is associated with an unknown factor. There has been no fever. The rash is present on the back. The pain is moderate. The pain has been constant since onset. Associated symptoms include itching and pain. Pertinent negatives include no blisters and no weeping. She has tried nothing for the symptoms.    Past Medical History  Diagnosis Date  . Diabetes mellitus   . Bipolar 1 disorder   . Back pain, chronic     Past Surgical History  Procedure Date  . Appendectomy     No family history on file.  History  Substance Use Topics  . Smoking status: Current Everyday Smoker  . Smokeless tobacco: Not on file  . Alcohol Use: No    OB History    Grav Para Term Preterm Abortions TAB SAB Ect Mult Living                  Review of Systems  Constitutional: Negative for fever and chills.  HENT: Negative for neck pain and neck stiffness.   Musculoskeletal: Positive for back pain.  Skin: Positive for itching and rash (insect bite? pruritis).  Neurological: Negative for dizziness and headaches.  All other systems reviewed and are negative.    Allergies  Review of patient's allergies indicates no known allergies.  Home Medications   Current Outpatient Rx  Name Route Sig Dispense Refill  . AZITHROMYCIN 250 MG PO TABS Oral Take 1 tablet (250 mg total) by mouth daily. Take first 2 tablets together, then 1 every day until finished. 6 tablet 0  . CITALOPRAM HYDROBROMIDE 40 MG PO TABS Oral Take 40 mg by mouth at bedtime.    Marland Kitchen HYDROCODONE-HOMATROPINE 5-1.5 MG/5ML PO SYRP Oral Take 5 mLs by  mouth every 6 (six) hours as needed for cough. 120 mL 0    BP 134/77  Pulse 103  Temp 98.3 F (36.8 C) (Oral)  Resp 16  SpO2 98%  LMP 01/13/2012  Physical Exam  Vitals reviewed. Constitutional: She is oriented to person, place, and time. Vital signs are normal. She appears well-developed and well-nourished. No distress.  HENT:  Head: Normocephalic and atraumatic.  Eyes: Pupils are equal, round, and reactive to light.  Neck: Neck supple.  Pulmonary/Chest: Effort normal.  Neurological: She is alert and oriented to person, place, and time.  Skin: Skin is warm and dry. Rash (Patient has an erythematous area on right upper back. Approximately 1cm X 4 cm. Central abrasion. No fluctuance of induration) noted. No erythema. No pallor.  Psychiatric: She has a normal mood and affect. Her behavior is normal.    ED Course  Procedures  MDM    Discharged with ABx and advised to return for worsening symptoms.      Thomasene Lot, PA-C 02/23/12 [phone]

## 2012-02-23 NOTE — ED Notes (Signed)
Patient with insect bite to right upper back.  Patient states that it is burning in the area and painful to palpation.

## 2012-02-23 NOTE — ED Provider Notes (Signed)
Medical screening examination/treatment/procedure(s) were performed by non-physician practitioner and as supervising physician I was immediately available for consultation/collaboration.  Olivia Mackie, MD 02/23/12 [phone]

## 2012-02-23 NOTE — ED Notes (Signed)
Pt. Report irritation on her back x 1 day. Small bite noted on right upper back at bra line. Pt denies itching. Reports pain, described as burning. 5/10. Respirations even and unlabored. Skin warm and dry. Vitals stable. NAD. Family at bedside.

## 2012-02-25 ENCOUNTER — Emergency Department (HOSPITAL_COMMUNITY): Payer: Self-pay

## 2012-02-25 ENCOUNTER — Encounter (HOSPITAL_COMMUNITY): Payer: Self-pay | Admitting: Emergency Medicine

## 2012-02-25 DIAGNOSIS — J189 Pneumonia, unspecified organism: Secondary | ICD-10-CM | POA: Insufficient documentation

## 2012-02-25 DIAGNOSIS — G8929 Other chronic pain: Secondary | ICD-10-CM | POA: Insufficient documentation

## 2012-02-25 DIAGNOSIS — F319 Bipolar disorder, unspecified: Secondary | ICD-10-CM | POA: Insufficient documentation

## 2012-02-25 DIAGNOSIS — F17200 Nicotine dependence, unspecified, uncomplicated: Secondary | ICD-10-CM | POA: Insufficient documentation

## 2012-02-25 DIAGNOSIS — E119 Type 2 diabetes mellitus without complications: Secondary | ICD-10-CM | POA: Insufficient documentation

## 2012-02-25 LAB — CBC WITH DIFFERENTIAL/PLATELET
Basophils Relative: 1 % (ref 0–1)
Eosinophils Absolute: 0.3 10*3/uL (ref 0.0–0.7)
Eosinophils Relative: 5 % (ref 0–5)
Hemoglobin: 13.8 g/dL (ref 12.0–15.0)
MCH: 30.3 pg (ref 26.0–34.0)
MCHC: 34.5 g/dL (ref 30.0–36.0)
MCV: 87.9 fL (ref 78.0–100.0)
Monocytes Relative: 6 % (ref 3–12)
Neutrophils Relative %: 59 % (ref 43–77)

## 2012-02-25 LAB — URINALYSIS, ROUTINE W REFLEX MICROSCOPIC
Bilirubin Urine: NEGATIVE
Ketones, ur: NEGATIVE mg/dL
Nitrite: NEGATIVE
Urobilinogen, UA: 0.2 mg/dL (ref 0.0–1.0)
pH: 6.5 (ref 5.0–8.0)

## 2012-02-25 NOTE — ED Notes (Signed)
Pt st's she was dx with pneumonia 1 week ago has finished her antibiotics but does not feel any better.  St's has continued to have elevated temp.

## 2012-02-26 ENCOUNTER — Emergency Department (HOSPITAL_COMMUNITY)
Admission: EM | Admit: 2012-02-26 | Discharge: 2012-02-26 | Disposition: A | Discharge status: Home / Self Care | Payer: Self-pay | Attending: Emergency Medicine | Admitting: Emergency Medicine

## 2012-02-26 DIAGNOSIS — J189 Pneumonia, unspecified organism: Secondary | ICD-10-CM

## 2012-02-26 HISTORY — DX: Pneumonia, unspecified organism: J18.9

## 2012-02-26 MED ORDER — DEXTROSE 5 % IV SOLN
1.0000 g | Freq: Once | INTRAVENOUS | Status: AC
  Administered 2012-02-26: 1 g via INTRAVENOUS
  Filled 2012-02-26: qty 10

## 2012-02-26 MED ORDER — MOXIFLOXACIN HCL 400 MG PO TABS: 400.0000 mg | ORAL_TABLET | Freq: Every day | ORAL | Status: DC

## 2012-02-26 MED ORDER — MOXIFLOXACIN HCL IN NACL 400 MG/250ML IV SOLN
400.0000 mg | Freq: Once | INTRAVENOUS | Status: AC
  Administered 2012-02-26: 400 mg via INTRAVENOUS
  Filled 2012-02-26: qty 250

## 2012-02-26 MED ORDER — SODIUM CHLORIDE 0.9 % IV SOLN
Freq: Once | INTRAVENOUS | Status: AC
  Administered 2012-02-26: 03:00:00 via INTRAVENOUS

## 2012-02-26 MED ORDER — IBUPROFEN 800 MG PO TABS
800.0000 mg | ORAL_TABLET | Freq: Once | ORAL | Status: AC
  Administered 2012-02-26: 800 mg via ORAL
  Filled 2012-02-26: qty 1

## 2012-02-26 NOTE — ED Provider Notes (Signed)
History     CSN: [id number]  Arrival date & time 02/25/12  2233   First MD Initiated Contact with Patient 02/26/12 [phone]      Chief Complaint  Patient presents with  . Pneumonia    (Consider location/radiation/quality/duration/timing/severity/associated sxs/prior treatment) HPI Comments: Patient was diagnosed with pneumonia on July 23, treated with by mouth azithromycin, which he, states he has completed.  She still having persistent fevers.  Cough, pain, and a burning cold sensation with deep inspiration.  She, states, that when she starts coughing, she can't stop and she loses her breath.  Patient is a 21 y.o. female presenting with pneumonia. The history is provided by the patient.  Pneumonia This is a recurrent problem. The current episode started in the past 7 days. The problem occurs intermittently. Associated symptoms include chills, coughing and a fever. Pertinent negatives include no abdominal pain, nausea, vomiting or weakness. The symptoms are aggravated by exertion.    Past Medical History  Diagnosis Date  . Diabetes mellitus   . Bipolar 1 disorder   . Back pain, chronic   . Pneumonia     Past Surgical History  Procedure Date  . Appendectomy     No family history on file.  History  Substance Use Topics  . Smoking status: Current Everyday Smoker  . Smokeless tobacco: Not on file  . Alcohol Use: No    OB History    Grav Para Term Preterm Abortions TAB SAB Ect Mult Living                  Review of Systems  Constitutional: Positive for fever, chills and appetite change.  Respiratory: Positive for cough and shortness of breath.   Cardiovascular: Negative for leg swelling.  Gastrointestinal: Negative for nausea, vomiting and abdominal pain.  Neurological: Negative for dizziness and weakness.    Allergies  Review of patient's allergies indicates no known allergies.  Home Medications   Current Outpatient Rx  Name Route Sig Dispense Refill  .  CITALOPRAM HYDROBROMIDE 40 MG PO TABS Oral Take 40 mg by mouth at bedtime.    Marland Kitchen HYDROCODONE-HOMATROPINE 5-1.5 MG/5ML PO SYRP Oral Take 5 mLs by mouth every 6 (six) hours as needed for cough. 120 mL 0  . MOXIFLOXACIN HCL 400 MG PO TABS Oral Take 1 tablet (400 mg total) by mouth daily. 6 tablet 0    BP 115/64  Pulse 67  Temp 99.2 F (37.3 C) (Oral)  Resp 18  SpO2 100%  LMP 01/13/2012  Physical Exam  Constitutional: She appears well-developed and well-nourished.  HENT:  Head: Normocephalic.  Neck: Normal range of motion.  Cardiovascular: Normal rate.   Pulmonary/Chest: Effort normal. No respiratory distress. She has no wheezes. She exhibits no tenderness.  Abdominal: Soft.  Musculoskeletal: Normal range of motion.  Neurological: She is alert.  Skin: No rash noted.    ED Course  Procedures (including critical care time)   Labs Reviewed  URINALYSIS, ROUTINE W REFLEX MICROSCOPIC  CBC WITH DIFFERENTIAL  POCT PREGNANCY, URINE   Dg Chest 2 View  02/25/2012  *RADIOLOGY REPORT*  Clinical Data: Diagnosis of pneumonia 1 week ago.  The patient has finished antibiotics but not feeling better.  Fever.  CHEST - 2 VIEW  Comparison: 02/18/2012  Findings: The heart size is normal.  There is persistent minimal density in the medial aspect of the right lower lobe, consistent with infectious infiltrate.  No evidence for pulmonary edema. There are no pleural effusions.  IMPRESSION: Persistent  minimal infiltrate in the right lower lobe.  Original Report Authenticated By: Patterson Hammersmith, M.D.     1. Community acquired pneumonia       MDM   X-ray reveals persistent pneumonia.  Patient remains febrile despite a five-day course of azithromycin and has symptomatic shortness of breath without a decrease in oxygen saturation I've spoken with case management, earache, or who has authorized Avelox, 6 tablets per patient to take home to avoid admission.  Due to patient's financial  constraints       Arman Filter, NP 02/26/12 0540  Arman Filter, NP 02/26/12 [phone]

## 2012-02-26 NOTE — ED Notes (Signed)
Avalox given from pharmacy

## 2012-02-26 NOTE — ED Provider Notes (Signed)
Medical screening examination/treatment/procedure(s) were performed by non-physician practitioner and as supervising physician I was immediately available for consultation/collaboration.  Olivia Mackie, MD 02/26/12 [phone]

## 2012-03-03 ENCOUNTER — Encounter (HOSPITAL_COMMUNITY): Payer: Self-pay | Admitting: Emergency Medicine

## 2012-03-03 ENCOUNTER — Emergency Department (HOSPITAL_COMMUNITY): Payer: Self-pay

## 2012-03-03 ENCOUNTER — Emergency Department (HOSPITAL_COMMUNITY)
Admission: EM | Admit: 2012-03-03 | Discharge: 2012-03-03 | Disposition: A | Discharge status: Home / Self Care | Payer: Self-pay | Attending: Emergency Medicine | Admitting: Emergency Medicine

## 2012-03-03 DIAGNOSIS — B349 Viral infection, unspecified: Secondary | ICD-10-CM

## 2012-03-03 DIAGNOSIS — B9789 Other viral agents as the cause of diseases classified elsewhere: Secondary | ICD-10-CM | POA: Insufficient documentation

## 2012-03-03 DIAGNOSIS — F319 Bipolar disorder, unspecified: Secondary | ICD-10-CM | POA: Insufficient documentation

## 2012-03-03 DIAGNOSIS — E119 Type 2 diabetes mellitus without complications: Secondary | ICD-10-CM | POA: Insufficient documentation

## 2012-03-03 DIAGNOSIS — Z8701 Personal history of pneumonia (recurrent): Secondary | ICD-10-CM | POA: Insufficient documentation

## 2012-03-03 LAB — D-DIMER, QUANTITATIVE: D-Dimer, Quant: <0.22 ug/mL-FEU (ref 0.00–0.48)

## 2012-03-03 LAB — BASIC METABOLIC PANEL
BUN: 13 mg/dL (ref 6–23)
CO2: 26 mEq/L (ref 19–32)
Calcium: 9.3 mg/dL (ref 8.4–10.5)
Creatinine, Ser: 0.81 mg/dL (ref 0.50–1.10)
Glucose, Bld: 98 mg/dL (ref 70–99)

## 2012-03-03 LAB — TROPONIN I: Troponin I: <0.3 ng/mL (ref <0.30)

## 2012-03-03 MED ORDER — IBUPROFEN 400 MG PO TABS: 400.0000 mg | ORAL_TABLET | Freq: Four times a day (QID) | ORAL | Status: AC | PRN

## 2012-03-03 NOTE — ED Notes (Signed)
Pt sts hx of CAP and given 2 rounds af antibiotics with no relief; pt sts took all meds; pt sts pain in right side of back and body aches

## 2012-03-03 NOTE — ED Notes (Signed)
Advised of the wait time 

## 2012-03-04 NOTE — ED Provider Notes (Signed)
History     CSN: [id number]  Arrival date & time 03/03/12  1713   First MD Initiated Contact with Patient 03/03/12 2129      Chief Complaint  Patient presents with  . Pneumonia    (Consider location/radiation/quality/duration/timing/severity/associated sxs/prior treatment) HPI Comments: Pt with no medical hx comes in with cc of chest pain and back pain - both right sided and upper thoraic. The pais in not exertional, pleuritic, but is worse with palpation. Pain is constant. Pt has had a recent CAP, and she has finished the course of AB. She also states that she is having some fevers, chills and malaise. Pt has mild SOB, that is constant. No hx of PE, DVT and no risk factors for the same.   Patient is a 21 y.o. female presenting with pneumonia. The history is provided by the patient and medical records.  Pneumonia Associated symptoms include chest pain and shortness of breath. Pertinent negatives include no abdominal pain and no headaches.    Past Medical History  Diagnosis Date  . Diabetes mellitus   . Bipolar 1 disorder   . Back pain, chronic   . Pneumonia     Past Surgical History  Procedure Date  . Appendectomy     History reviewed. No pertinent family history.  History  Substance Use Topics  . Smoking status: Current Everyday Smoker  . Smokeless tobacco: Not on file  . Alcohol Use: No    OB History    Grav Para Term Preterm Abortions TAB SAB Ect Mult Living                  Review of Systems  Constitutional: Positive for activity change and fatigue.  HENT: Negative for neck pain.   Respiratory: Positive for shortness of breath.   Cardiovascular: Positive for chest pain.  Gastrointestinal: Negative for nausea, vomiting and abdominal pain.  Genitourinary: Negative for dysuria.  Neurological: Negative for headaches.    Allergies  Review of patient's allergies indicates no known allergies.  Home Medications   Current Outpatient Rx  Name Route Sig  Dispense Refill  . ACETAMINOPHEN 500 MG PO TABS Oral Take 500 mg by mouth every 6 (six) hours as needed. For pain    . CITALOPRAM HYDROBROMIDE 40 MG PO TABS Oral Take 40 mg by mouth at bedtime.    . IBUPROFEN 400 MG PO TABS Oral Take 1 tablet (400 mg total) by mouth every 6 (six) hours as needed for pain. 30 tablet 0    BP 119/70  Pulse 61  Temp 98.2 F (36.8 C) (Oral)  Resp 18  SpO2 100%  LMP 02/25/2012  Physical Exam  Constitutional: She is oriented to person, place, and time. She appears well-developed and well-nourished.  HENT:  Head: Normocephalic and atraumatic.  Eyes: EOM are normal. Pupils are equal, round, and reactive to light.  Neck: Neck supple.  Cardiovascular: Normal rate, regular rhythm and normal heart sounds.   No murmur heard. Pulmonary/Chest: Effort normal. No respiratory distress.  Abdominal: Soft. She exhibits no distension. There is no tenderness. There is no rebound and no guarding.  Neurological: She is alert and oriented to person, place, and time.  Skin: Skin is warm and dry.    ED Course  Procedures (including critical care time)   Labs Reviewed  D-DIMER, QUANTITATIVE  BASIC METABOLIC PANEL  TROPONIN I   Dg Chest 2 View  03/03/2012  *RADIOLOGY REPORT*  Clinical Data: Right lower chest pain, shortness of breath, cough  CHEST - 2 VIEW  Comparison:  02/25/2012  Findings:  The heart size and mediastinal contours are within normal limits.  Both lungs are clear.  The visualized skeletal structures are unremarkable.  IMPRESSION: No active cardiopulmonary disease.  Original Report Authenticated By: Judie Petit. Ruel Favors, M.D.     1. Viral syndrome       MDM  Differential diagnosis includes: ACS syndrome CHF exacerbation Valvular disorder Myocarditis Pericarditis Pericardial effusion Pneumonia Pleural effusion Pulmonary edema PE Anemia Musculoskeletal pain  Pt with no cardiac risk factors comes in with right sided chest pain and some SOB. Had a  recent pneumonia. WELLS score is 0. We will get d-dimer, which if negative, patient will be sent home. Likely viral syndrome. Cardiac exam reveals no rubs and the pain is not positional        Derwood Kaplan, MD 03/04/12 [phone]

## 2012-08-12 ENCOUNTER — Emergency Department (HOSPITAL_COMMUNITY)
Admission: EM | Admit: 2012-08-12 | Discharge: 2012-08-13 | Disposition: A | Discharge status: Home / Self Care | Payer: Medicaid Other | Attending: Emergency Medicine | Admitting: Emergency Medicine

## 2012-08-12 ENCOUNTER — Encounter (HOSPITAL_COMMUNITY): Payer: Self-pay | Admitting: *Deleted

## 2012-08-12 DIAGNOSIS — Z79899 Other long term (current) drug therapy: Secondary | ICD-10-CM | POA: Insufficient documentation

## 2012-08-12 DIAGNOSIS — G8929 Other chronic pain: Secondary | ICD-10-CM | POA: Insufficient documentation

## 2012-08-12 DIAGNOSIS — Z8701 Personal history of pneumonia (recurrent): Secondary | ICD-10-CM | POA: Insufficient documentation

## 2012-08-12 DIAGNOSIS — N760 Acute vaginitis: Secondary | ICD-10-CM

## 2012-08-12 DIAGNOSIS — R300 Dysuria: Secondary | ICD-10-CM | POA: Insufficient documentation

## 2012-08-12 DIAGNOSIS — Z8659 Personal history of other mental and behavioral disorders: Secondary | ICD-10-CM | POA: Insufficient documentation

## 2012-08-12 DIAGNOSIS — O2390 Unspecified genitourinary tract infection in pregnancy, unspecified trimester: Secondary | ICD-10-CM | POA: Insufficient documentation

## 2012-08-12 DIAGNOSIS — M549 Dorsalgia, unspecified: Secondary | ICD-10-CM | POA: Insufficient documentation

## 2012-08-12 DIAGNOSIS — Z87891 Personal history of nicotine dependence: Secondary | ICD-10-CM | POA: Insufficient documentation

## 2012-08-12 DIAGNOSIS — Z8632 Personal history of gestational diabetes: Secondary | ICD-10-CM | POA: Insufficient documentation

## 2012-08-12 LAB — URINALYSIS, ROUTINE W REFLEX MICROSCOPIC
Glucose, UA: NEGATIVE mg/dL
Hgb urine dipstick: NEGATIVE
Leukocytes, UA: NEGATIVE
Specific Gravity, Urine: 1.012 (ref 1.005–1.030)
pH: 6.5 (ref 5.0–8.0)

## 2012-08-12 LAB — WET PREP, GENITAL: Yeast Wet Prep HPF POC: NONE SEEN

## 2012-08-12 LAB — POCT PREGNANCY, URINE: Preg Test, Ur: POSITIVE — AB

## 2012-08-12 MED ORDER — ACETAMINOPHEN 500 MG PO TABS
1000.0000 mg | ORAL_TABLET | Freq: Once | ORAL | Status: AC
  Administered 2012-08-12: 1000 mg via ORAL
  Filled 2012-08-12: qty 2

## 2012-08-12 NOTE — ED Notes (Signed)
Patient states she is having left lower quadrant pain x 2 days.  Patient also states that after urinating, she has pain in her bladder.

## 2012-08-12 NOTE — ED Notes (Signed)
Pt c/o pain after urination x 6 weeks.  States she has intermittent pains in her abdomen.  Also states she was on abx for UTI but "doesn't think it cleared up".  Pt is 13 weeks and 6 days pregnant.

## 2012-08-12 NOTE — ED Provider Notes (Signed)
History     CSN: [id number]  Arrival date & time 08/12/12  2201   First MD Initiated Contact with Patient 08/12/12 2259      Chief Complaint  Patient presents with  . Dysuria    (Consider location/radiation/quality/duration/timing/severity/associated sxs/prior treatment) Patient is a 22 y.o. female presenting with dysuria. The history is provided by the patient.  Dysuria  This is a chronic problem. The current episode started more than 1 week ago. The problem occurs every urination. The problem has not changed since onset.The quality of the pain is described as burning. There has been no fever. Associated symptoms include discharge. Pertinent negatives include no frequency and no hematuria. She has tried nothing for the symptoms. Her past medical history does not include catheterization.  States for 6 weeks she has had burning with urination and GYN states she should come in for that and suprapubic pain.    Past Medical History  Diagnosis Date  . Bipolar 1 disorder   . Back pain, chronic   . Pneumonia   . Diabetes mellitus     "with last pregnancy"    Past Surgical History  Procedure Date  . Appendectomy     History reviewed. No pertinent family history.  History  Substance Use Topics  . Smoking status: Former Games developer  . Smokeless tobacco: Not on file  . Alcohol Use: No    OB History    Grav Para Term Preterm Abortions TAB SAB Ect Mult Living   1               Review of Systems  Genitourinary: Positive for dysuria. Negative for frequency and hematuria.  All other systems reviewed and are negative.    Allergies  Review of patient's allergies indicates no known allergies.  Home Medications   Current Outpatient Rx  Name  Route  Sig  Dispense  Refill  . ACETAMINOPHEN 500 MG PO TABS   Oral   Take 500 mg by mouth every 6 (six) hours as needed. For pain         . DOXYLAMINE-PYRIDOXINE 10-10 MG PO TBEC   Oral   Take 1 tablet by mouth 4 (four) times  daily.         Marland Kitchen PRENATAL 19 29-1 MG PO CHEW   Oral   Chew 2 tablets by mouth daily.           BP 135/73  Pulse 88  Temp 98.5 F (36.9 C) (Oral)  Resp 16  SpO2 98%  LMP 02/25/2012  Physical Exam  Constitutional: She is oriented to person, place, and time. She appears well-developed and well-nourished. No distress.  HENT:  Head: Normocephalic and atraumatic.  Mouth/Throat: Oropharynx is clear and moist.  Eyes: Conjunctivae normal are normal. Pupils are equal, round, and reactive to light.  Neck: Normal range of motion. Neck supple.  Cardiovascular: Normal rate and regular rhythm.   Pulmonary/Chest: Effort normal and breath sounds normal. She has no wheezes. She has no rales.  Abdominal: Soft. Bowel sounds are normal. There is no tenderness. There is no rebound and no guarding.  Genitourinary:       Chaperone present os closed no CMT nor adnexal tenderness.  Uterus cw dates  Musculoskeletal: Normal range of motion. She exhibits no edema.  Neurological: She is alert and oriented to person, place, and time.  Skin: Skin is warm and dry.  Psychiatric: She has a normal mood and affect.    ED Course  Procedures (including critical  care time)  Labs Reviewed  POCT PREGNANCY, URINE - Abnormal; Notable for the following:    Preg Test, Ur POSITIVE (*)     All other components within normal limits  URINALYSIS, ROUTINE W REFLEX MICROSCOPIC  WET PREP, GENITAL  GC/CHLAMYDIA PROBE AMP   No results found.   No diagnosis found.    MDM  Suspect round ligament pain.  Will treat for bacterial vaginosis follow up with your obstetrician this week        Tiya Schrupp K Areeba Sulser-Rasch, MD 08/13/12 1610

## 2012-08-13 ENCOUNTER — Emergency Department (HOSPITAL_COMMUNITY): Payer: Medicaid Other

## 2012-08-13 MED ORDER — METRONIDAZOLE 500 MG PO TABS: 500.0000 mg | ORAL_TABLET | Freq: Two times a day (BID) | ORAL | Status: DC

## 2012-08-23 ENCOUNTER — Encounter (HOSPITAL_COMMUNITY): Payer: Self-pay | Admitting: *Deleted

## 2012-08-23 ENCOUNTER — Inpatient Hospital Stay (HOSPITAL_COMMUNITY)
Admission: AD | Admit: 2012-08-23 | Discharge: 2012-08-23 | Disposition: A | Discharge status: Home / Self Care | Payer: Medicaid Other | Source: Ambulatory Visit | Attending: Obstetrics & Gynecology | Admitting: Obstetrics & Gynecology

## 2012-08-23 DIAGNOSIS — O2390 Unspecified genitourinary tract infection in pregnancy, unspecified trimester: Secondary | ICD-10-CM | POA: Insufficient documentation

## 2012-08-23 DIAGNOSIS — B9689 Other specified bacterial agents as the cause of diseases classified elsewhere: Secondary | ICD-10-CM | POA: Insufficient documentation

## 2012-08-23 DIAGNOSIS — O210 Mild hyperemesis gravidarum: Secondary | ICD-10-CM | POA: Insufficient documentation

## 2012-08-23 DIAGNOSIS — O26899 Other specified pregnancy related conditions, unspecified trimester: Secondary | ICD-10-CM

## 2012-08-23 DIAGNOSIS — N760 Acute vaginitis: Secondary | ICD-10-CM | POA: Insufficient documentation

## 2012-08-23 DIAGNOSIS — R109 Unspecified abdominal pain: Secondary | ICD-10-CM

## 2012-08-23 DIAGNOSIS — A499 Bacterial infection, unspecified: Secondary | ICD-10-CM | POA: Insufficient documentation

## 2012-08-23 HISTORY — DX: Constipation, unspecified: K59.00

## 2012-08-23 LAB — URINALYSIS, ROUTINE W REFLEX MICROSCOPIC
Glucose, UA: NEGATIVE mg/dL
Hgb urine dipstick: NEGATIVE
Ketones, ur: NEGATIVE mg/dL
Leukocytes, UA: NEGATIVE
Protein, ur: NEGATIVE mg/dL
pH: 6 (ref 5.0–8.0)

## 2012-08-23 LAB — WET PREP, GENITAL
Clue Cells Wet Prep HPF POC: NONE SEEN
Trich, Wet Prep: NONE SEEN
Yeast Wet Prep HPF POC: NONE SEEN

## 2012-08-23 LAB — CBC WITH DIFFERENTIAL/PLATELET
Eosinophils Relative: 2 % (ref 0–5)
HCT: 31.8 % — ABNORMAL LOW (ref 36.0–46.0)
Hemoglobin: 11 g/dL — ABNORMAL LOW (ref 12.0–15.0)
Lymphocytes Relative: 24 % (ref 12–46)
MCV: 87.6 fL (ref 78.0–100.0)
Monocytes Absolute: 0.5 10*3/uL (ref 0.1–1.0)
Monocytes Relative: 7 % (ref 3–12)
Neutro Abs: 4.5 10*3/uL (ref 1.7–7.7)
RDW: 13.3 % (ref 11.5–15.5)
WBC: 6.8 10*3/uL (ref 4.0–10.5)

## 2012-08-23 NOTE — MAU Note (Addendum)
Patient presents to MAU with c/o L lower abdominal pain radiating to mid-lower abdomen x 2 days. Took 500 mg Tylenol at 1930 for pain; unrelieved. Reports sexual intercourse 3 days ago. Two weeks ago was seen by Dr. Joanna Puff in [place] for BV and are not treating it b/c liver enzymes were elevated. Reports hyperemesis in pregnancy; has prescriptions for Phenergan and Zofran. Has hx of SAB at 8wks last April and is concerned. Denies vaginal bleeding.

## 2012-08-23 NOTE — MAU Note (Signed)
Pt reports "i have been having really bad pains in my left side", reports pain x 2 days. Denies bleeding.

## 2012-08-23 NOTE — MAU Provider Note (Signed)
History     CSN: [id number]  Arrival date and time: 08/23/12 2039   First Provider Initiated Contact with Patient 08/23/12 2138      Chief Complaint  Patient presents with  . Abdominal Pain   HPI Sara Bradshaw is a 22 y.o. female @ [redacted]w[redacted]d gestation who presents to MAU with abdominal pain. The pain started 2 days ago. The pain is located in the left lower abdomen. The pain radiates across the lower abdomen. She rates the pain as 4/10. She describes the pain as stabbing, throbbing pain that is constant. Nothing makes the pain better or worse. Last sexual intercourse 3 days ago without pain. Getting prenatal care with Dr. Shawnie Pons in [facility]. Complete evaluation @ [facility] 08/12/12 including ultrasound and treated for BV and round ligament pain. The history was provided by the patient and her medical record.  OB History    Grav Para Term Preterm Abortions TAB SAB Ect Mult Living   4 2 1 1 1  1   2       Past Medical History  Diagnosis Date  . Bipolar 1 disorder   . Back pain, chronic   . Pneumonia   . Diabetes mellitus     "with last pregnancy"  . Constipation     Past Surgical History  Procedure Date  . Appendectomy     History reviewed. No pertinent family history.  History  Substance Use Topics  . Smoking status: Former Smoker    Quit date: 06/12/2012  . Smokeless tobacco: Never Used  . Alcohol Use: No    Allergies: No Known Allergies  Prescriptions prior to admission  Medication Sig Dispense Refill  . acetaminophen (TYLENOL) 500 MG tablet Take 500 mg by mouth every 6 (six) hours as needed. For pain      . ondansetron (ZOFRAN-ODT) 4 MG disintegrating tablet Take 4 mg by mouth every 8 (eight) hours as needed. nausea      . Prenatal Vit-Fe Fumarate-FA (PRENATAL 19) 29-1 MG CHEW Chew 2 tablets by mouth daily.      . promethazine (PHENERGAN) 25 MG tablet Take 25 mg by mouth every 6 (six) hours as needed. nausea      . ranitidine (ZANTAC) 150 MG capsule Take 150 mg by  mouth daily.      . metroNIDAZOLE (FLAGYL) 500 MG tablet Take 1 tablet (500 mg total) by mouth 2 (two) times daily. One po bid x 7 days  14 tablet  0    Review of Systems  Constitutional: Negative for fever, chills and weight loss.  HENT: Positive for congestion. Negative for ear pain, nosebleeds, sore throat and neck pain.   Eyes: Negative for blurred vision, double vision, photophobia and pain.  Respiratory: Positive for cough. Negative for shortness of breath and wheezing.   Cardiovascular: Negative for chest pain, palpitations and leg swelling.  Gastrointestinal: Positive for abdominal pain and constipation. Negative for heartburn, nausea, vomiting and diarrhea.  Genitourinary: Negative for dysuria, urgency and frequency.  Musculoskeletal: Positive for back pain (chronic). Negative for myalgias.  Skin: Negative for itching and rash.  Neurological: Negative for dizziness, sensory change, speech change, seizures, weakness and headaches.  Endo/Heme/Allergies: Does not bruise/bleed easily.  Psychiatric/Behavioral: Negative for substance abuse. Depression: bipolar. The patient is not nervous/anxious and does not have insomnia.    Physical Exam   Blood pressure 124/68, pulse 106, temperature 98.8 F (37.1 C), temperature source Oral, resp. rate 20, height 5\' 5"  (1.651 m), weight 228 lb (103.42  kg), last menstrual period 02/25/2012, SpO2 100.00%.  Physical Exam  Nursing note and vitals reviewed. Constitutional: She is oriented to person, place, and time. She appears well-developed and well-nourished. No distress.  HENT:  Head: Normocephalic and atraumatic.  Eyes: EOM are normal.  Neck: Neck supple.  Cardiovascular: Normal rate.   Respiratory: Effort normal.  GI: Soft. There is tenderness in the left upper quadrant and left lower quadrant. There is no rigidity, no rebound, no guarding and no CVA tenderness.       Obese, positive FHT's.   Genitourinary:       External genitalia without  lesions. White discharge vaginal vault. Cervix long, closed, mild CMT, left adnexal tenderness. Uterus consistent with dates.  Musculoskeletal: Normal range of motion.  Neurological: She is alert and oriented to person, place, and time.  Skin: Skin is warm and dry.  Psychiatric: She has a normal mood and affect. Her behavior is normal. Judgment and thought content normal.   Results for orders placed during the hospital encounter of 08/23/12 (from the past 24 hour(s))  URINALYSIS, ROUTINE W REFLEX MICROSCOPIC     Status: Abnormal   Collection Time   08/23/12  8:54 PM      Component Value Range   Color, Urine YELLOW  YELLOW   APPearance CLEAR  CLEAR   Specific Gravity, Urine >1.030 (*) 1.005 - 1.030   pH 6.0  5.0 - 8.0   Glucose, UA NEGATIVE  NEGATIVE mg/dL   Hgb urine dipstick NEGATIVE  NEGATIVE   Bilirubin Urine NEGATIVE  NEGATIVE   Ketones, ur NEGATIVE  NEGATIVE mg/dL   Protein, ur NEGATIVE  NEGATIVE mg/dL   Urobilinogen, UA 0.2  0.0 - 1.0 mg/dL   Nitrite NEGATIVE  NEGATIVE   Leukocytes, UA NEGATIVE  NEGATIVE  WET PREP, GENITAL     Status: Abnormal   Collection Time   08/23/12 10:00 PM      Component Value Range   Yeast Wet Prep HPF POC NONE SEEN  NONE SEEN   Trich, Wet Prep NONE SEEN  NONE SEEN   Clue Cells Wet Prep HPF POC NONE SEEN  NONE SEEN   WBC, Wet Prep HPF POC FEW (*) NONE SEEN  CBC WITH DIFFERENTIAL     Status: Abnormal   Collection Time   08/23/12 10:09 PM      Component Value Range   WBC 6.8  4.0 - 10.5 K/uL   RBC 3.63 (*) 3.87 - 5.11 MIL/uL   Hemoglobin 11.0 (*) 12.0 - 15.0 g/dL   HCT 16.1 (*) 09.6 - 04.5 %   MCV 87.6  78.0 - 100.0 fL   MCH 30.3  26.0 - 34.0 pg   MCHC 34.6  30.0 - 36.0 g/dL   RDW 40.9  81.1 - 91.4 %   Platelets 193  150 - 400 K/uL   Neutrophils Relative 67  43 - 77 %   Neutro Abs 4.5  1.7 - 7.7 K/uL   Lymphocytes Relative 24  12 - 46 %   Lymphs Abs 1.6  0.7 - 4.0 K/uL   Monocytes Relative 7  3 - 12 %   Monocytes Absolute 0.5  0.1 - 1.0 K/uL    Eosinophils Relative 2  0 - 5 %   Eosinophils Absolute 0.1  0.0 - 0.7 K/uL   Basophils Relative 0  0 - 1 %   Basophils Absolute 0.0  0.0 - 0.1 K/uL    Assessment: 22 y.o. female @ [redacted]w[redacted]d with abdominal pain  Round Ligament pain  Plan:  Tylenol for discomfort    Pregnancy girdle   Stressed importance of getting care with her doctor in [facility]  Discussed with the patient and all questioned fully answered. Patient voices understanding. Procedures  Seymone Forlenza, RN, FNP, [facility] 08/23/2012, 10:41 PM

## 2012-08-25 NOTE — MAU Provider Note (Signed)
Attestation of Attending Supervision of Advanced Practitioner (CNM/NP): Evaluation and management procedures were performed by the Advanced Practitioner under my supervision and collaboration.  I have reviewed the Advanced Practitioner's note and chart, and I agree with the management and plan.  HARRAWAY-SMITH, Cristine Daw 9:25 AM     

## 2013-02-28 ENCOUNTER — Emergency Department (HOSPITAL_COMMUNITY)
Admission: EM | Admit: 2013-02-28 | Discharge: 2013-02-28 | Disposition: A | Discharge status: Home / Self Care | Payer: Medicaid Other | Attending: Emergency Medicine | Admitting: Emergency Medicine

## 2013-02-28 ENCOUNTER — Encounter (HOSPITAL_COMMUNITY): Payer: Self-pay | Admitting: Emergency Medicine

## 2013-02-28 DIAGNOSIS — R221 Localized swelling, mass and lump, neck: Secondary | ICD-10-CM | POA: Insufficient documentation

## 2013-02-28 DIAGNOSIS — Z8701 Personal history of pneumonia (recurrent): Secondary | ICD-10-CM | POA: Insufficient documentation

## 2013-02-28 DIAGNOSIS — G8929 Other chronic pain: Secondary | ICD-10-CM | POA: Insufficient documentation

## 2013-02-28 DIAGNOSIS — Z8632 Personal history of gestational diabetes: Secondary | ICD-10-CM | POA: Insufficient documentation

## 2013-02-28 DIAGNOSIS — K089 Disorder of teeth and supporting structures, unspecified: Secondary | ICD-10-CM | POA: Insufficient documentation

## 2013-02-28 DIAGNOSIS — F17200 Nicotine dependence, unspecified, uncomplicated: Secondary | ICD-10-CM | POA: Insufficient documentation

## 2013-02-28 DIAGNOSIS — H9209 Otalgia, unspecified ear: Secondary | ICD-10-CM | POA: Insufficient documentation

## 2013-02-28 DIAGNOSIS — Z8719 Personal history of other diseases of the digestive system: Secondary | ICD-10-CM | POA: Insufficient documentation

## 2013-02-28 DIAGNOSIS — Z8659 Personal history of other mental and behavioral disorders: Secondary | ICD-10-CM | POA: Insufficient documentation

## 2013-02-28 DIAGNOSIS — K0889 Other specified disorders of teeth and supporting structures: Secondary | ICD-10-CM

## 2013-02-28 DIAGNOSIS — R220 Localized swelling, mass and lump, head: Secondary | ICD-10-CM | POA: Insufficient documentation

## 2013-02-28 MED ORDER — OXYCODONE-ACETAMINOPHEN 5-325 MG PO TABS
2.0000 | ORAL_TABLET | Freq: Once | ORAL | Status: AC
  Administered 2013-02-28: 2 via ORAL
  Filled 2013-02-28: qty 2

## 2013-02-28 MED ORDER — OXYCODONE-ACETAMINOPHEN 5-325 MG PO TABS: 1.0000 | ORAL_TABLET | ORAL | Status: DC | PRN

## 2013-02-28 MED ORDER — AMOXICILLIN 500 MG PO CAPS: 500.0000 mg | ORAL_CAPSULE | Freq: Three times a day (TID) | ORAL | Status: DC

## 2013-02-28 NOTE — ED Notes (Signed)
Pt c/o left lower toothache x 3 days. Pt took 3 tylenol today last dose @ 1500.

## 2013-02-28 NOTE — ED Provider Notes (Signed)
CSN: [id number]     Arrival date & time 02/28/13  1758 History  This chart was scribed for non-physician practitioner Arthor Captain, PA-C, working with Gilda Crease, by Yevette Edwards, ED Scribe. This patient was seen in room TR07C/TR07C and the patient's care was started at 6:51 PM.   First MD Initiated Contact with Patient 02/28/13 1805     Chief Complaint  Patient presents with  . Dental Pain    left lower    The history is provided by the patient. No language interpreter was used.   HPI Comments:  Sara Bradshaw is a 22 y.o. female who presents to the Emergency Department complaining of constant and gradually-increasing pain to a left lower tooth which began three days ago.  She states the pain radiates down the left jaw, and she reports that she is eating less due to the pain. She reports that palpation worsens the pain. The pt has also experienced swelling to the left side of her jaw and left-sided ear pain. She denies difficulty swallowing, voice changes, or SOB.  The pt has attempted to mitigate her symptoms with tylenol, but with little resolution.The pt has a h/o of dental problems.   Past Medical History  Diagnosis Date  . Bipolar 1 disorder   . Back pain, chronic   . Pneumonia   . Diabetes mellitus     "with last pregnancy"  . Constipation    Past Surgical History  Procedure Laterality Date  . Appendectomy     No family history on file. History  Substance Use Topics  . Smoking status: Current Every Day Smoker  . Smokeless tobacco: Never Used  . Alcohol Use: No   OB History   Grav Para Term Preterm Abortions TAB SAB Ect Mult Living   4 2 1 1 1  1   2      Review of Systems  Constitutional: Positive for appetite change.  HENT: Positive for ear pain, facial swelling and dental problem. Negative for trouble swallowing and voice change.   Respiratory: Negative for shortness of breath.     Allergies  Review of patient's allergies indicates no known  allergies.  Home Medications   Current Outpatient Rx  Name  Route  Sig  Dispense  Refill  . ACETAMINOPHEN PO   Oral   Take 1 tablet by mouth daily as needed (for pain).          Triage Vitals: BP 134/84  Pulse 114  Temp(Src) 98.5 F (36.9 C) (Oral)  Resp 18  Ht 5\' 5"  (1.651 m)  Wt 237 lb (107.502 kg)  BMI 39.44 kg/m2  SpO2 100%  LMP 02/25/2012  Breastfeeding? No  Physical Exam  Nursing note and vitals reviewed. Constitutional: She is oriented to person, place, and time. She appears well-developed and well-nourished. No distress.  HENT:  Head: Normocephalic and atraumatic.  Partially erupting wisdom teeth bilaterally.  Left lower tooth:  swelling of the buccal mucosal and gingiva. It is exquisitely tender to palpation. No drainage or palpable abscess.  Uvula midline. No pharyngeal edema. Airway is patent.  Tender lymphadopathy. Some induration of the masseter.  Eyes: EOM are normal.  Neck: Neck supple. No tracheal deviation present.  Cardiovascular: Normal rate.   Pulmonary/Chest: Effort normal. No respiratory distress.  Musculoskeletal: Normal range of motion.  Neurological: She is alert and oriented to person, place, and time.  Skin: Skin is warm and dry.  Psychiatric: She has a normal mood and affect. Her behavior is  normal.    ED Course   DIAGNOSTIC STUDIES: Oxygen Saturation is 100% on room air, normal by my interpretation.    COORDINATION OF CARE:  6:58 PM- Discussed treatment plan with patient, and the patient agreed to the plan.   Procedures (including critical care time)  Labs Reviewed - No data to display No results found. 1. Pain, dental     MDM  Patient with toothache.  No gross abscess.  Exam unconcerning for Ludwig's angina or spread of infection.  Will treat with penicillin and pain medicine.  Urged patient to follow-up with dentist.    I personally performed the services described in this documentation, which was scribed in my presence.  The recorded information has been reviewed and is accurate.    Arthor Captain, PA-C 02/28/13 1943

## 2013-02-28 NOTE — ED Notes (Signed)
Toothache for 2 days she has had tylenol

## 2013-03-02 NOTE — ED Provider Notes (Signed)
Medical screening examination/treatment/procedure(s) were performed by non-physician practitioner and as supervising physician I was immediately available for consultation/collaboration.    Santiana Glidden J. Ronin Crager, MD 03/02/13 0715 

## 2013-03-26 ENCOUNTER — Emergency Department (HOSPITAL_COMMUNITY)
Admission: EM | Admit: 2013-03-26 | Discharge: 2013-03-26 | Disposition: A | Discharge status: Home / Self Care | Payer: Medicaid Other | Attending: Emergency Medicine | Admitting: Emergency Medicine

## 2013-03-26 ENCOUNTER — Encounter (HOSPITAL_COMMUNITY): Payer: Self-pay | Admitting: Emergency Medicine

## 2013-03-26 DIAGNOSIS — F319 Bipolar disorder, unspecified: Secondary | ICD-10-CM | POA: Insufficient documentation

## 2013-03-26 DIAGNOSIS — M549 Dorsalgia, unspecified: Secondary | ICD-10-CM | POA: Insufficient documentation

## 2013-03-26 DIAGNOSIS — Y93E9 Activity, other interior property and clothing maintenance: Secondary | ICD-10-CM | POA: Insufficient documentation

## 2013-03-26 DIAGNOSIS — G8929 Other chronic pain: Secondary | ICD-10-CM | POA: Insufficient documentation

## 2013-03-26 DIAGNOSIS — X500XXA Overexertion from strenuous movement or load, initial encounter: Secondary | ICD-10-CM | POA: Insufficient documentation

## 2013-03-26 DIAGNOSIS — Y92009 Unspecified place in unspecified non-institutional (private) residence as the place of occurrence of the external cause: Secondary | ICD-10-CM | POA: Insufficient documentation

## 2013-03-26 DIAGNOSIS — S239XXA Sprain of unspecified parts of thorax, initial encounter: Secondary | ICD-10-CM | POA: Insufficient documentation

## 2013-03-26 DIAGNOSIS — S29012A Strain of muscle and tendon of back wall of thorax, initial encounter: Secondary | ICD-10-CM

## 2013-03-26 DIAGNOSIS — F17200 Nicotine dependence, unspecified, uncomplicated: Secondary | ICD-10-CM | POA: Insufficient documentation

## 2013-03-26 MED ORDER — CYCLOBENZAPRINE HCL 10 MG PO TABS
5.0000 mg | ORAL_TABLET | Freq: Once | ORAL | Status: AC
  Administered 2013-03-26: 5 mg via ORAL
  Filled 2013-03-26: qty 1

## 2013-03-26 MED ORDER — CYCLOBENZAPRINE HCL 5 MG PO TABS: 5.0000 mg | ORAL_TABLET | Freq: Once | ORAL | Status: DC

## 2013-03-26 NOTE — ED Notes (Signed)
Pt discharged.Vital signs stable and GCS 15 

## 2013-03-26 NOTE — ED Provider Notes (Signed)
CSN: [id number]     Arrival date & time 03/26/13  0122 History   First MD Initiated Contact with Patient 03/26/13 0134     No chief complaint on file.  (Consider location/radiation/quality/duration/timing/severity/associated sxs/prior Treatment) HPI Comments: Agency to try to move, all shelf by lifting up from the bottom, without been eating properly, and now has bilateral sprain, or mid, and upper back without relief.  The history is provided by the patient.    Past Medical History  Diagnosis Date  . Bipolar 1 disorder   . Back pain, chronic   . Pneumonia   . Diabetes mellitus     "with last pregnancy"  . Constipation    Past Surgical History  Procedure Laterality Date  . Appendectomy     No family history on file. History  Substance Use Topics  . Smoking status: Current Every Day Smoker  . Smokeless tobacco: Never Used  . Alcohol Use: No   OB History   Grav Para Term Preterm Abortions TAB SAB Ect Mult Living   4 2 1 1 1  1   2      Review of Systems  Respiratory: Negative for shortness of breath.   Skin: Negative for rash and wound.  All other systems reviewed and are negative.    Allergies  Review of patient's allergies indicates no known allergies.  Home Medications   Current Outpatient Rx  Name  Route  Sig  Dispense  Refill  . ibuprofen (ADVIL,MOTRIN) 800 MG tablet   Oral   Take 800 mg by mouth every 8 (eight) hours as needed for pain.         Marland Kitchen amoxicillin (AMOXIL) 500 MG capsule   Oral   Take 1 capsule (500 mg total) by mouth 3 (three) times daily.   21 capsule   0   . cyclobenzaprine (FLEXERIL) 5 MG tablet   Oral   Take 1 tablet (5 mg total) by mouth once.   30 tablet   0   . oxyCODONE-acetaminophen (PERCOCET) 5-325 MG per tablet   Oral   Take 1-2 tablets by mouth every 4 (four) hours as needed for pain.   30 tablet   0    BP 146/78  Pulse 111  Temp(Src) 97.1 F (36.2 C) (Oral)  Resp 18  SpO2 100%  LMP 02/25/2012 Physical Exam   Nursing note and vitals reviewed. Constitutional: She is oriented to person, place, and time. She appears well-developed and well-nourished.  HENT:  Head: Normocephalic.  Eyes: Pupils are equal, round, and reactive to light.  Neck: Normal range of motion.  Cardiovascular: Normal rate and regular rhythm.   Pulmonary/Chest: Effort normal and breath sounds normal. No respiratory distress. She has no rales.  Neurological: She is alert and oriented to person, place, and time.  Skin: Skin is warm. No rash noted. No erythema.    ED Course  Procedures (including critical care time) Labs Review Labs Reviewed - No data to display Imaging Review No results found.  MDM   1. Muscle strain of right upper back, initial encounter     I prescribed Flexeril, warm compresses, ibuprofen on a regular basis.  Patient to followup with her primary care physician as needed    Arman Filter, NP 03/26/13 0151

## 2013-03-26 NOTE — ED Notes (Signed)
Pt  Presented to ED with back pain.As per pt she was moving books from her bookshelf and developed sudden pain.

## 2013-03-26 NOTE — ED Provider Notes (Signed)
Medical screening examination/treatment/procedure(s) were performed by non-physician practitioner and as supervising physician I was immediately available for consultation/collaboration.  Sunnie Nielsen, MD 03/26/13 [phone]

## 2013-05-22 ENCOUNTER — Emergency Department (HOSPITAL_COMMUNITY)
Admission: EM | Admit: 2013-05-22 | Discharge: 2013-05-22 | Disposition: A | Discharge status: Home / Self Care | Payer: Medicaid Other | Attending: Emergency Medicine | Admitting: Emergency Medicine

## 2013-05-22 ENCOUNTER — Encounter (HOSPITAL_COMMUNITY): Payer: Self-pay | Admitting: Emergency Medicine

## 2013-05-22 DIAGNOSIS — Z8719 Personal history of other diseases of the digestive system: Secondary | ICD-10-CM | POA: Insufficient documentation

## 2013-05-22 DIAGNOSIS — Z8701 Personal history of pneumonia (recurrent): Secondary | ICD-10-CM | POA: Insufficient documentation

## 2013-05-22 DIAGNOSIS — Z79899 Other long term (current) drug therapy: Secondary | ICD-10-CM | POA: Insufficient documentation

## 2013-05-22 DIAGNOSIS — R000 Tachycardia, unspecified: Secondary | ICD-10-CM | POA: Insufficient documentation

## 2013-05-22 DIAGNOSIS — G8929 Other chronic pain: Secondary | ICD-10-CM | POA: Insufficient documentation

## 2013-05-22 DIAGNOSIS — F319 Bipolar disorder, unspecified: Secondary | ICD-10-CM | POA: Insufficient documentation

## 2013-05-22 DIAGNOSIS — M25569 Pain in unspecified knee: Secondary | ICD-10-CM | POA: Insufficient documentation

## 2013-05-22 DIAGNOSIS — F17200 Nicotine dependence, unspecified, uncomplicated: Secondary | ICD-10-CM | POA: Insufficient documentation

## 2013-05-22 DIAGNOSIS — E119 Type 2 diabetes mellitus without complications: Secondary | ICD-10-CM | POA: Insufficient documentation

## 2013-05-22 MED ORDER — TRAMADOL HCL 50 MG PO TABS: 50.0000 mg | ORAL_TABLET | Freq: Four times a day (QID) | ORAL | Status: DC | PRN

## 2013-05-22 NOTE — ED Notes (Signed)
Pt in c/o right knee pain x2 days, states pain is increased when bending knee and walking, denies injury, history of pain in the past but not like this

## 2013-05-22 NOTE — ED Notes (Signed)
Pt alert, NAD, calm, interactive, pedal pulses palpable, foot warm.

## 2013-05-22 NOTE — ED Provider Notes (Signed)
CSN: [id number]     Arrival date & time 05/22/13  2046 History   First MD Initiated Contact with Patient 05/22/13 2131     Chief Complaint  Patient presents with  . Knee Pain   (Consider location/radiation/quality/duration/timing/severity/associated sxs/prior Treatment) Patient is a 22 y.o. female presenting with knee pain.  Knee Pain  Pt reports long standing history of intermittent bilateral knee pain, states she used to go to PT and see ortho but has not done so in several years. States she has had 3 days of worsening aching pain in the R knee, worse with walking but not associated with falls or injury. She has not taken any medication for same. She also reports chronic tachycardia, typically HR is in the 110-120s but has been as high as 150s. She has never sought medical care for this. No CP, SOB.  Past Medical History  Diagnosis Date  . Bipolar 1 disorder   . Back pain, chronic   . Pneumonia   . Diabetes mellitus     "with last pregnancy"  . Constipation    Past Surgical History  Procedure Laterality Date  . Appendectomy     History reviewed. No pertinent family history. History  Substance Use Topics  . Smoking status: Current Every Day Smoker  . Smokeless tobacco: Never Used  . Alcohol Use: No   OB History   Grav Para Term Preterm Abortions TAB SAB Ect Mult Living   4 2 1 1 1  1   2      Review of Systems All other systems reviewed and are negative except as noted in HPI.   Allergies  Review of patient's allergies indicates no known allergies.  Home Medications   Current Outpatient Rx  Name  Route  Sig  Dispense  Refill  . hydrOXYzine (ATARAX/VISTARIL) 25 MG tablet   Oral   Take 25 mg by mouth every 6 (six) hours as needed for itching.         . sertraline (ZOLOFT) 50 MG tablet   Oral   Take 50 mg by mouth daily.         . traZODone (DESYREL) 50 MG tablet   Oral   Take 50 mg by mouth at bedtime.          BP 150/83  Temp(Src) 98.5 F (36.9 C)  (Oral)  Resp 20  Wt 247 lb (112.038 kg)  BMI 41.1 kg/m2  SpO2 100%  LMP 02/25/2012 Physical Exam  Nursing note and vitals reviewed. Constitutional: She is oriented to person, place, and time. She appears well-developed and well-nourished.  HENT:  Head: Normocephalic and atraumatic.  Eyes: EOM are normal. Pupils are equal, round, and reactive to light.  Neck: Normal range of motion. Neck supple.  Cardiovascular: Normal heart sounds and intact distal pulses.  Tachycardia present.   Pulmonary/Chest: Effort normal and breath sounds normal.  Abdominal: Bowel sounds are normal. She exhibits no distension. There is no tenderness.  Musculoskeletal: Normal range of motion. She exhibits tenderness (tender palpation anterior R knee). She exhibits no edema.  No effusion, no signs of infection  Neurological: She is alert and oriented to person, place, and time. She has normal strength. No cranial nerve deficit or sensory deficit.  Skin: Skin is warm and dry. No rash noted.  Psychiatric: She has a normal mood and affect.    ED Course  Procedures (including critical care time) Labs Review Labs Reviewed - No data to display Imaging Review No results found.  EKG Interpretation   None       MDM   1. Chronic knee pain, right   2. Tachycardia     Chronic tachycardia, no concern for acute process such as ACS or PE, PCP or cardiology followup PRN. Pt's knee exam is unremarkable. Advised knee brace, Tramadol PRN and Ortho followup.     Charles B. Bernette Mayers, MD 05/22/13 2148

## 2013-05-26 ENCOUNTER — Emergency Department (HOSPITAL_COMMUNITY): Payer: Medicaid Other

## 2013-05-26 ENCOUNTER — Emergency Department (HOSPITAL_COMMUNITY)
Admission: EM | Admit: 2013-05-26 | Discharge: 2013-05-26 | Disposition: A | Discharge status: Home / Self Care | Payer: Medicaid Other | Attending: Emergency Medicine | Admitting: Emergency Medicine

## 2013-05-26 ENCOUNTER — Encounter (HOSPITAL_COMMUNITY): Payer: Self-pay | Admitting: Emergency Medicine

## 2013-05-26 DIAGNOSIS — G8929 Other chronic pain: Secondary | ICD-10-CM | POA: Insufficient documentation

## 2013-05-26 DIAGNOSIS — Z8701 Personal history of pneumonia (recurrent): Secondary | ICD-10-CM | POA: Insufficient documentation

## 2013-05-26 DIAGNOSIS — Z79899 Other long term (current) drug therapy: Secondary | ICD-10-CM | POA: Insufficient documentation

## 2013-05-26 DIAGNOSIS — R072 Precordial pain: Secondary | ICD-10-CM | POA: Insufficient documentation

## 2013-05-26 DIAGNOSIS — E119 Type 2 diabetes mellitus without complications: Secondary | ICD-10-CM | POA: Insufficient documentation

## 2013-05-26 DIAGNOSIS — F17200 Nicotine dependence, unspecified, uncomplicated: Secondary | ICD-10-CM | POA: Insufficient documentation

## 2013-05-26 DIAGNOSIS — K219 Gastro-esophageal reflux disease without esophagitis: Secondary | ICD-10-CM | POA: Insufficient documentation

## 2013-05-26 DIAGNOSIS — F319 Bipolar disorder, unspecified: Secondary | ICD-10-CM | POA: Insufficient documentation

## 2013-05-26 DIAGNOSIS — Z3202 Encounter for pregnancy test, result negative: Secondary | ICD-10-CM | POA: Insufficient documentation

## 2013-05-26 DIAGNOSIS — R079 Chest pain, unspecified: Secondary | ICD-10-CM

## 2013-05-26 LAB — BASIC METABOLIC PANEL
CO2: 22 mEq/L (ref 19–32)
Calcium: 9.2 mg/dL (ref 8.4–10.5)
Chloride: 102 mEq/L (ref 96–112)
Creatinine, Ser: 0.67 mg/dL (ref 0.50–1.10)
GFR calc non Af Amer: >90 mL/min (ref >90)
Glucose, Bld: 107 mg/dL — ABNORMAL HIGH (ref 70–99)
Potassium: 4 mEq/L (ref 3.5–5.1)
Sodium: 138 mEq/L (ref 135–145)

## 2013-05-26 LAB — CBC
HCT: 35.9 % — ABNORMAL LOW (ref 36.0–46.0)
Hemoglobin: 11.5 g/dL — ABNORMAL LOW (ref 12.0–15.0)
MCH: 25.8 pg — ABNORMAL LOW (ref 26.0–34.0)
MCHC: 32 g/dL (ref 30.0–36.0)
MCV: 80.5 fL (ref 78.0–100.0)
Platelets: 238 10*3/uL (ref 150–400)
RBC: 4.46 MIL/uL (ref 3.87–5.11)
WBC: 7.5 10*3/uL (ref 4.0–10.5)

## 2013-05-26 LAB — HEPATIC FUNCTION PANEL
AST: 36 U/L (ref 0–37)
Albumin: 4.1 g/dL (ref 3.5–5.2)
Alkaline Phosphatase: 73 U/L (ref 39–117)
Bilirubin, Direct: <0.1 mg/dL (ref 0.0–0.3)
Total Bilirubin: 0.4 mg/dL (ref 0.3–1.2)
Total Protein: 7.3 g/dL (ref 6.0–8.3)

## 2013-05-26 LAB — POCT I-STAT TROPONIN I: Troponin i, poc: 0 ng/mL (ref 0.00–0.08)

## 2013-05-26 MED ORDER — OXYCODONE-ACETAMINOPHEN 5-325 MG PO TABS
1.0000 | ORAL_TABLET | Freq: Once | ORAL | Status: AC
  Administered 2013-05-26: 1 via ORAL
  Filled 2013-05-26: qty 1

## 2013-05-26 MED ORDER — OMEPRAZOLE 20 MG PO CPDR: 20.0000 mg | DELAYED_RELEASE_CAPSULE | Freq: Every day | ORAL | Status: DC

## 2013-05-26 MED ORDER — GI COCKTAIL ~~LOC~~
30.0000 mL | Freq: Once | ORAL | Status: AC
  Administered 2013-05-26: 30 mL via ORAL
  Filled 2013-05-26: qty 30

## 2013-05-26 MED ORDER — IBUPROFEN 200 MG PO TABS
600.0000 mg | ORAL_TABLET | Freq: Once | ORAL | Status: AC
  Administered 2013-05-26: 600 mg via ORAL
  Filled 2013-05-26: qty 3

## 2013-05-26 NOTE — ED Notes (Addendum)
Presents with sternal chest pain with radiation into left arm and left neck began at 11 pm while watching tv on couch associated with nausea. Pain described as burning and numbness. Denies SOB. Father died of MI at 34

## 2013-05-26 NOTE — ED Provider Notes (Signed)
CSN: [id number]     Arrival date & time 05/26/13  0018 History   First MD Initiated Contact with Patient 05/26/13 0202     Chief Complaint  Patient presents with  . Chest Pain    HPI Patient reports substernal burning chest pain with radiation into her left arm and left neck that began at 11:00 while watching TV.  She has some discomfort with deep breathing but denies shortness of breath at this time.  Family history of early heart disease.  No hypertension.  Denies high cholesterol.  Denies diabetes.  She did have gestational diabetes however.  She does continue to smoke cigarettes.  She denies abdominal pain.  No nausea or vomiting.  No diarrhea.  No melena or hematochezia.  Symptoms are mild in severity.   Past Medical History  Diagnosis Date  . Bipolar 1 disorder   . Back pain, chronic   . Pneumonia   . Diabetes mellitus     "with last pregnancy"  . Constipation    Past Surgical History  Procedure Laterality Date  . Appendectomy     History reviewed. No pertinent family history. History  Substance Use Topics  . Smoking status: Current Every Day Smoker  . Smokeless tobacco: Never Used  . Alcohol Use: No   OB History   Grav Para Term Preterm Abortions TAB SAB Ect Mult Living   4 2 1 1 1  1   2      Review of Systems  All other systems reviewed and are negative.    Allergies  Review of patient's allergies indicates no known allergies.  Home Medications   Current Outpatient Rx  Name  Route  Sig  Dispense  Refill  . hydrOXYzine (ATARAX/VISTARIL) 25 MG tablet   Oral   Take 25 mg by mouth every 6 (six) hours as needed for itching.         . sertraline (ZOLOFT) 50 MG tablet   Oral   Take 50 mg by mouth daily.         . traMADol (ULTRAM) 50 MG tablet   Oral   Take 1 tablet (50 mg total) by mouth every 6 (six) hours as needed for pain.   15 tablet   0   . traZODone (DESYREL) 50 MG tablet   Oral   Take 50 mg by mouth at bedtime.         Marland Kitchen omeprazole  (PRILOSEC) 20 MG capsule   Oral   Take 1 capsule (20 mg total) by mouth daily.   30 capsule   0    BP 122/81  Pulse 101  Temp(Src) 98.4 F (36.9 C) (Oral)  Resp 16  Wt 242 lb (109.77 kg)  BMI 40.27 kg/m2  SpO2 97%  LMP 04/28/2013 Physical Exam  Nursing note and vitals reviewed. Constitutional: She is oriented to person, place, and time. She appears well-developed and well-nourished. No distress.  HENT:  Head: Normocephalic and atraumatic.  Eyes: EOM are normal.  Neck: Normal range of motion.  Cardiovascular: Normal rate, regular rhythm and normal heart sounds.   Pulmonary/Chest: Effort normal and breath sounds normal.  Abdominal: Soft. She exhibits no distension. There is no tenderness.  Musculoskeletal: Normal range of motion.  Neurological: She is alert and oriented to person, place, and time.  Skin: Skin is warm and dry.  Psychiatric: She has a normal mood and affect. Judgment normal.    ED Course  Procedures (including critical care time) Labs Review Labs Reviewed  CBC - Abnormal; Notable for the following:    Hemoglobin 11.5 (*)    HCT 35.9 (*)    MCH 25.8 (*)    RDW 16.5 (*)    All other components within normal limits  BASIC METABOLIC PANEL - Abnormal; Notable for the following:    Glucose, Bld 107 (*)    All other components within normal limits  HEPATIC FUNCTION PANEL - Abnormal; Notable for the following:    ALT 78 (*)    All other components within normal limits  LIPASE, BLOOD  D-DIMER, QUANTITATIVE  POCT PREGNANCY, URINE  POCT I-STAT TROPONIN I   Imaging Review Dg Chest 2 View  05/26/2013   CLINICAL DATA:  Chest pain  EXAM: CHEST - 2 VIEW  COMPARISON:  03/03/2012  FINDINGS: The heart size and mediastinal contours are within normal limits. Both lungs are clear. The visualized skeletal structures are unremarkable. No effusion.  IMPRESSION: No acute cardiopulmonary disease.   Electronically Signed   By: Oley Balm M.D.   On: 05/26/2013 00:57  I  personally reviewed the imaging tests through PACS system I reviewed available ER/hospitalization records through the EMR   EKG Interpretation     Ventricular Rate:  97 PR Interval:  170 QRS Duration: 82 QT Interval:  358 QTC Calculation: 454 R Axis:   83 Text Interpretation:  Normal sinus rhythm Normal ECG No previous ECGs available            MDM   1. Chest pain   2. GERD (gastroesophageal reflux disease)    3:19 AM Patient feels much better at this time.  Improvement in her symptoms after GI cocktail.  Labs without significant abnormality.  Doubt ACS.  Doubt pulmonary embolism.  Discharge home in good condition.    Lyanne Co, MD 05/26/13 [phone]

## 2013-06-19 ENCOUNTER — Encounter (HOSPITAL_COMMUNITY): Payer: Self-pay | Admitting: Emergency Medicine

## 2013-06-19 ENCOUNTER — Emergency Department (HOSPITAL_COMMUNITY)
Admission: EM | Admit: 2013-06-19 | Discharge: 2013-06-20 | Discharge status: Against Medical Advice | Payer: Medicaid Other | Attending: Emergency Medicine | Admitting: Emergency Medicine

## 2013-06-19 DIAGNOSIS — R161 Splenomegaly, not elsewhere classified: Secondary | ICD-10-CM | POA: Insufficient documentation

## 2013-06-19 DIAGNOSIS — F17200 Nicotine dependence, unspecified, uncomplicated: Secondary | ICD-10-CM | POA: Insufficient documentation

## 2013-06-19 DIAGNOSIS — R112 Nausea with vomiting, unspecified: Secondary | ICD-10-CM | POA: Insufficient documentation

## 2013-06-19 DIAGNOSIS — E119 Type 2 diabetes mellitus without complications: Secondary | ICD-10-CM | POA: Insufficient documentation

## 2013-06-19 DIAGNOSIS — R109 Unspecified abdominal pain: Secondary | ICD-10-CM | POA: Insufficient documentation

## 2013-06-19 NOTE — ED Notes (Signed)
Pt c/o left sided upper abdominal pain with nausea, vomiting, and diarrhea. CT scan performed last week showed enlarged spleen. Pt states pain worse today. Rated 7 on 0-10 pain scale.

## 2013-06-19 NOTE — ED Notes (Addendum)
Pt also states cold since Tuesday. Was prescribed Zofran for her nausea and states that it is ineffective

## 2013-06-20 LAB — URINALYSIS, ROUTINE W REFLEX MICROSCOPIC
Glucose, UA: NEGATIVE mg/dL
Leukocytes, UA: NEGATIVE
Protein, ur: NEGATIVE mg/dL
Specific Gravity, Urine: 1.027 (ref 1.005–1.030)
Urobilinogen, UA: 0.2 mg/dL (ref 0.0–1.0)

## 2013-06-20 LAB — CBC WITH DIFFERENTIAL/PLATELET
Basophils Absolute: 0 10*3/uL (ref 0.0–0.1)
Basophils Relative: 1 % (ref 0–1)
Eosinophils Absolute: 0.1 10*3/uL (ref 0.0–0.7)
Eosinophils Relative: 4 % (ref 0–5)
HCT: 35.1 % — ABNORMAL LOW (ref 36.0–46.0)
Hemoglobin: 11.5 g/dL — ABNORMAL LOW (ref 12.0–15.0)
Lymphs Abs: 1.1 10*3/uL (ref 0.7–4.0)
MCH: 26.6 pg (ref 26.0–34.0)
MCHC: 32.8 g/dL (ref 30.0–36.0)
MCV: 81.3 fL (ref 78.0–100.0)
Monocytes Absolute: 0.3 10*3/uL (ref 0.1–1.0)
Monocytes Relative: 10 % (ref 3–12)
RDW: 16.4 % — ABNORMAL HIGH (ref 11.5–15.5)
WBC: 3.3 10*3/uL — ABNORMAL LOW (ref 4.0–10.5)

## 2013-06-20 LAB — COMPREHENSIVE METABOLIC PANEL
Albumin: 3.7 g/dL (ref 3.5–5.2)
BUN: 13 mg/dL (ref 6–23)
Calcium: 8.9 mg/dL (ref 8.4–10.5)
Chloride: 100 mEq/L (ref 96–112)
Creatinine, Ser: 0.75 mg/dL (ref 0.50–1.10)
Glucose, Bld: 100 mg/dL — ABNORMAL HIGH (ref 70–99)
Total Bilirubin: 0.2 mg/dL — ABNORMAL LOW (ref 0.3–1.2)
Total Protein: 6.7 g/dL (ref 6.0–8.3)

## 2013-06-20 LAB — POCT PREGNANCY, URINE: Preg Test, Ur: NEGATIVE

## 2013-06-20 NOTE — ED Notes (Signed)
Pt not in waiting room when called.   

## 2013-07-11 DIAGNOSIS — Z885 Allergy status to narcotic agent status: Secondary | ICD-10-CM | POA: Insufficient documentation

## 2013-07-11 DIAGNOSIS — Y92410 Unspecified street and highway as the place of occurrence of the external cause: Secondary | ICD-10-CM | POA: Insufficient documentation

## 2013-07-11 DIAGNOSIS — Z8742 Personal history of other diseases of the female genital tract: Secondary | ICD-10-CM | POA: Insufficient documentation

## 2013-07-11 DIAGNOSIS — W57XXXA Bitten or stung by nonvenomous insect and other nonvenomous arthropods, initial encounter: Secondary | ICD-10-CM | POA: Insufficient documentation

## 2013-07-11 DIAGNOSIS — M545 Low back pain, unspecified: Secondary | ICD-10-CM | POA: Insufficient documentation

## 2013-07-11 DIAGNOSIS — G8929 Other chronic pain: Secondary | ICD-10-CM | POA: Insufficient documentation

## 2013-07-11 DIAGNOSIS — R209 Unspecified disturbances of skin sensation: Secondary | ICD-10-CM | POA: Insufficient documentation

## 2013-07-11 DIAGNOSIS — Z79899 Other long term (current) drug therapy: Secondary | ICD-10-CM | POA: Insufficient documentation

## 2013-07-11 DIAGNOSIS — T148 Other injury of unspecified body region: Secondary | ICD-10-CM | POA: Diagnosis not present

## 2013-07-11 DIAGNOSIS — F17200 Nicotine dependence, unspecified, uncomplicated: Secondary | ICD-10-CM | POA: Insufficient documentation

## 2013-07-11 DIAGNOSIS — F319 Bipolar disorder, unspecified: Secondary | ICD-10-CM | POA: Insufficient documentation

## 2013-07-11 DIAGNOSIS — K5900 Constipation, unspecified: Secondary | ICD-10-CM | POA: Insufficient documentation

## 2013-07-11 DIAGNOSIS — L299 Pruritus, unspecified: Secondary | ICD-10-CM | POA: Insufficient documentation

## 2013-07-11 DIAGNOSIS — R21 Rash and other nonspecific skin eruption: Secondary | ICD-10-CM | POA: Diagnosis not present

## 2013-07-11 DIAGNOSIS — Z8701 Personal history of pneumonia (recurrent): Secondary | ICD-10-CM | POA: Insufficient documentation

## 2013-07-11 DIAGNOSIS — Y939 Activity, unspecified: Secondary | ICD-10-CM | POA: Insufficient documentation

## 2013-07-12 ENCOUNTER — Emergency Department (HOSPITAL_COMMUNITY): Payer: No Typology Code available for payment source

## 2013-07-12 ENCOUNTER — Encounter (HOSPITAL_COMMUNITY): Payer: Self-pay | Admitting: Emergency Medicine

## 2013-07-12 ENCOUNTER — Emergency Department (HOSPITAL_COMMUNITY)
Admission: EM | Admit: 2013-07-12 | Discharge: 2013-07-12 | Disposition: A | Discharge status: Home / Self Care | Payer: No Typology Code available for payment source | Attending: Emergency Medicine | Admitting: Emergency Medicine

## 2013-07-12 DIAGNOSIS — M545 Low back pain: Secondary | ICD-10-CM

## 2013-07-12 DIAGNOSIS — W57XXXA Bitten or stung by nonvenomous insect and other nonvenomous arthropods, initial encounter: Secondary | ICD-10-CM

## 2013-07-12 MED ORDER — HYDROCODONE-ACETAMINOPHEN 5-325 MG PO TABS: 1.0000 | ORAL_TABLET | ORAL | Status: DC | PRN

## 2013-07-12 MED ORDER — DEXAMETHASONE SODIUM PHOSPHATE 10 MG/ML IJ SOLN
10.0000 mg | Freq: Once | INTRAMUSCULAR | Status: AC
  Administered 2013-07-12: 10 mg via INTRAMUSCULAR
  Filled 2013-07-12: qty 1

## 2013-07-12 MED ORDER — CYCLOBENZAPRINE HCL 10 MG PO TABS: 10.0000 mg | ORAL_TABLET | Freq: Three times a day (TID) | ORAL | Status: DC | PRN

## 2013-07-12 MED ORDER — OXYCODONE-ACETAMINOPHEN 5-325 MG PO TABS
2.0000 | ORAL_TABLET | Freq: Once | ORAL | Status: AC
  Administered 2013-07-12: 2 via ORAL
  Filled 2013-07-12: qty 2

## 2013-07-12 MED ORDER — PREDNISONE 10 MG PO TABS: 20.0000 mg | ORAL_TABLET | Freq: Every day | ORAL | Status: DC

## 2013-07-12 MED ORDER — CYCLOBENZAPRINE HCL 10 MG PO TABS
5.0000 mg | ORAL_TABLET | Freq: Once | ORAL | Status: AC
  Administered 2013-07-12: 5 mg via ORAL
  Filled 2013-07-12: qty 1
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
## 2013-07-12 NOTE — ED Provider Notes (Signed)
Medical screening examination/treatment/procedure(s) were performed by non-physician practitioner and as supervising physician I was immediately available for consultation/collaboration.  EKG Interpretation   None        Kinzlie Harney, MD 07/12/13 0709 
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
## 2013-10-24 ENCOUNTER — Encounter (HOSPITAL_COMMUNITY): Payer: Self-pay | Admitting: Emergency Medicine

## 2013-10-24 ENCOUNTER — Emergency Department (HOSPITAL_COMMUNITY)
Admission: EM | Admit: 2013-10-24 | Discharge: 2013-10-24 | Disposition: A | Discharge status: Home / Self Care | Payer: Medicaid Other | Attending: Emergency Medicine | Admitting: Emergency Medicine

## 2013-10-24 DIAGNOSIS — Z3202 Encounter for pregnancy test, result negative: Secondary | ICD-10-CM | POA: Insufficient documentation

## 2013-10-24 DIAGNOSIS — E119 Type 2 diabetes mellitus without complications: Secondary | ICD-10-CM | POA: Insufficient documentation

## 2013-10-24 DIAGNOSIS — IMO0002 Reserved for concepts with insufficient information to code with codable children: Secondary | ICD-10-CM | POA: Insufficient documentation

## 2013-10-24 DIAGNOSIS — F17200 Nicotine dependence, unspecified, uncomplicated: Secondary | ICD-10-CM | POA: Insufficient documentation

## 2013-10-24 DIAGNOSIS — Z8659 Personal history of other mental and behavioral disorders: Secondary | ICD-10-CM | POA: Insufficient documentation

## 2013-10-24 DIAGNOSIS — G8929 Other chronic pain: Secondary | ICD-10-CM | POA: Insufficient documentation

## 2013-10-24 DIAGNOSIS — Z8701 Personal history of pneumonia (recurrent): Secondary | ICD-10-CM | POA: Insufficient documentation

## 2013-10-24 DIAGNOSIS — N72 Inflammatory disease of cervix uteri: Secondary | ICD-10-CM

## 2013-10-24 DIAGNOSIS — Z9089 Acquired absence of other organs: Secondary | ICD-10-CM | POA: Insufficient documentation

## 2013-10-24 LAB — RPR: RPR: NONREACTIVE

## 2013-10-24 LAB — URINALYSIS, ROUTINE W REFLEX MICROSCOPIC
BILIRUBIN URINE: NEGATIVE
Glucose, UA: NEGATIVE mg/dL
Hgb urine dipstick: NEGATIVE
Ketones, ur: NEGATIVE mg/dL
Leukocytes, UA: NEGATIVE
Nitrite: NEGATIVE
Protein, ur: NEGATIVE mg/dL
Specific Gravity, Urine: 1.022 (ref 1.005–1.030)
UROBILINOGEN UA: 0.2 mg/dL (ref 0.0–1.0)
pH: 6 (ref 5.0–8.0)

## 2013-10-24 LAB — CBG MONITORING, ED: GLUCOSE-CAPILLARY: 110 mg/dL — AB (ref 70–99)

## 2013-10-24 LAB — WET PREP, GENITAL
Trich, Wet Prep: NONE SEEN
WBC, Wet Prep HPF POC: NONE SEEN
Yeast Wet Prep HPF POC: NONE SEEN

## 2013-10-24 LAB — POC URINE PREG, ED: Preg Test, Ur: NEGATIVE

## 2013-10-24 LAB — HIV ANTIBODY (ROUTINE TESTING W REFLEX): HIV: NONREACTIVE

## 2013-10-24 MED ORDER — CEFTRIAXONE SODIUM 250 MG IJ SOLR
250.0000 mg | Freq: Once | INTRAMUSCULAR | Status: AC
  Administered 2013-10-24: 250 mg via INTRAMUSCULAR
  Filled 2013-10-24: qty 250

## 2013-10-24 MED ORDER — AZITHROMYCIN 250 MG PO TABS
1000.0000 mg | ORAL_TABLET | Freq: Once | ORAL | Status: AC
  Administered 2013-10-24: 1000 mg via ORAL
  Filled 2013-10-24: qty 4

## 2013-10-24 MED ORDER — IBUPROFEN 800 MG PO TABS
800.0000 mg | ORAL_TABLET | Freq: Once | ORAL | Status: AC
  Administered 2013-10-24: 800 mg via ORAL
  Filled 2013-10-24: qty 1

## 2013-10-24 MED ORDER — LIDOCAINE HCL (PF) 1 % IJ SOLN
INTRAMUSCULAR | Status: AC
  Administered 2013-10-24: 2.5 mL
  Filled 2013-10-24: qty 5

## 2013-10-24 MED ORDER — HYDROCODONE-ACETAMINOPHEN 5-325 MG PO TABS: 1.0000 | ORAL_TABLET | ORAL | Status: DC | PRN

## 2013-10-24 MED ORDER — ONDANSETRON HCL 8 MG PO TABS: 8.0000 mg | ORAL_TABLET | ORAL | Status: DC | PRN

## 2013-10-24 NOTE — ED Notes (Signed)
Dr. Shela CommonsJ at bedside with Pelvic cart.

## 2013-10-24 NOTE — ED Notes (Signed)
Pt advised that her husband would drive her home at discharge.

## 2013-10-24 NOTE — ED Provider Notes (Signed)
CSN: [id number]     Arrival date & time 10/24/13  4098 History   First MD Initiated Contact with Patient 10/24/13 0715     Chief Complaint  Patient presents with  . Abdominal Pain     (Consider location/radiation/quality/duration/timing/severity/associated sxs/prior Treatment) Patient is a 23 y.o. female presenting with abdominal pain.  Abdominal Pain Associated symptoms: nausea, vaginal discharge and vomiting    Complains of low nonradiating abdominal pain onset 1 week ago, progressively worsening. Pain made worse with sexual intercourse. Not made better by anything .  Associated sypmtoms include vaginal discharge.   And nausea,. Pt vomitted one time 3 days ago. She treated self with Aleve and Tylenol, without relief. Pain is mild presently. Last bowel movement yesterday, normal last normal menstrual period beginning of March 2015. No fever. Admits to diminshed apetite No other associated symptoms. Past Medical History  Diagnosis Date  . Bipolar 1 disorder   . Back pain, chronic   . Pneumonia   . Diabetes mellitus     "with last pregnancy"  . Constipation    Past Surgical History  Procedure Laterality Date  . Appendectomy     tubal ligation History reviewed. No pertinent family history. History  Substance Use Topics  . Smoking status: Current Every Day Smoker  . Smokeless tobacco: Never Used  . Alcohol Use: No   no druguse OB History   Grav Para Term Preterm Abortions TAB SAB Ect Mult Living   4 2 1 1 1  1   2      Review of Systems  Constitutional: Negative.   HENT: Negative.   Respiratory: Negative.   Cardiovascular: Negative.   Gastrointestinal: Positive for nausea, vomiting and abdominal pain.  Genitourinary: Positive for vaginal discharge.  Musculoskeletal: Negative.   Skin: Negative.   Neurological: Negative.   Psychiatric/Behavioral: Negative.   All other systems reviewed and are negative.      Allergies  Morphine and related  Home Medications    Current Outpatient Rx  Name  Route  Sig  Dispense  Refill  . acetaminophen (TYLENOL) 325 MG tablet   Oral   Take 650 mg by mouth every 6 (six) hours as needed for mild pain or headache.         . cyclobenzaprine (FLEXERIL) 10 MG tablet   Oral   Take 1 tablet (10 mg total) by mouth 3 (three) times daily as needed for muscle spasms.   30 tablet   0   . diphenhydrAMINE (SOMINEX) 25 MG tablet   Oral   Take 25 mg by mouth daily as needed for allergies or sleep.         Marland Kitchen HYDROcodone-acetaminophen (NORCO/VICODIN) 5-325 MG per tablet   Oral   Take 1-2 tablets by mouth every 4 (four) hours as needed for moderate pain.   20 tablet   0   . hydrocortisone cream 0.5 %   Topical   Apply 1 application topically as needed for itching.         . predniSONE (DELTASONE) 10 MG tablet   Oral   Take 2 tablets (20 mg total) by mouth daily.   10 tablet   0    BP 118/61  Pulse 83  Temp(Src) 98.2 F (36.8 C) (Oral)  Resp 20  Wt 243 lb (110.224 kg)  SpO2 98%  LMP 10/03/2013 Physical Exam  Nursing note and vitals reviewed. Constitutional: She appears well-developed and well-nourished.  HENT:  Head: Normocephalic and atraumatic.  Eyes: Conjunctivae are normal.  Pupils are equal, round, and reactive to light.  Neck: Neck supple. No tracheal deviation present. No thyromegaly present.  Cardiovascular: Normal rate and regular rhythm.   No murmur heard. Pulmonary/Chest: Effort normal and breath sounds normal.  Abdominal: Soft. Bowel sounds are normal. She exhibits no distension and no mass. There is tenderness. There is no rebound and no guarding.  Obese .suprwapubic tenderness  Genitourinary: Vaginal discharge found.  Yellow vaginal discharge. No external lesion. Cervical motion tenderness present on adnexal masses or tenderness  Musculoskeletal: Normal range of motion. She exhibits no edema and no tenderness.  Neurological: She is alert. Coordination normal.  Skin: Skin is warm and  dry. No rash noted.  Psychiatric: She has a normal mood and affect.    ED Course  Procedures (including critical care time) Labs Review Labs Reviewed - No data to display Imaging Review No results found.   EKG Interpretation None     9 a.m. is improved after treatment with ibuprofen. Results for orders placed during the hospital encounter of 10/24/13  WET PREP, GENITAL      Result Value Ref Range   Yeast Wet Prep HPF POC NONE SEEN  NONE SEEN   Trich, Wet Prep NONE SEEN  NONE SEEN   Clue Cells Wet Prep HPF POC RARE (*) NONE SEEN   WBC, Wet Prep HPF POC NONE SEEN  NONE SEEN  URINALYSIS, ROUTINE W REFLEX MICROSCOPIC      Result Value Ref Range   Color, Urine YELLOW  YELLOW   APPearance CLEAR  CLEAR   Specific Gravity, Urine 1.022  1.005 - 1.030   pH 6.0  5.0 - 8.0   Glucose, UA NEGATIVE  NEGATIVE mg/dL   Hgb urine dipstick NEGATIVE  NEGATIVE   Bilirubin Urine NEGATIVE  NEGATIVE   Ketones, ur NEGATIVE  NEGATIVE mg/dL   Protein, ur NEGATIVE  NEGATIVE mg/dL   Urobilinogen, UA 0.2  0.0 - 1.0 mg/dL   Nitrite NEGATIVE  NEGATIVE   Leukocytes, UA NEGATIVE  NEGATIVE  POC URINE PREG, ED      Result Value Ref Range   Preg Test, Ur NEGATIVE  NEGATIVE  CBG MONITORING, ED      Result Value Ref Range   Glucose-Capillary 110 (*) 70 - 99 mg/dL   No results found.  MDM  Patient treated with Rocephin and Zithromax prior to discharge Final diagnoses:  None    planfollowup with her gynecologist within a week. rx norco Diagnosis cervicitis      Doug SouSam Peggy Monk, MD 10/24/13 [phone]

## 2013-10-24 NOTE — Discharge Instructions (Signed)
Cervicitis Take Tylenol or Advil for mild pain or the pain medicine prescribed for bad pain. Call your gynecologist tomorrow to be seen in the office at the next available appointment. Return if you feel worse for any reason Cervicitis is a soreness and puffiness (inflammation) of the cervix.  HOME CARE  Do not have sex (intercourse) until your doctor says it is okay.  Do not have sex until your partner is treated or as told by your doctor.  Take your antibiotic medicine as told. Finish it even if you start to feel better. GET HELP IF:   Yours symptoms that brought you to the doctor come back.  You have a fever. MAKE SURE YOU:   Understand these instructions.  Will watch your condition.  Will get help right away if you are not doing well or get worse. Document Released: 04/23/2008 Document Revised: 03/17/2013 Document Reviewed: 01/06/2013 [facility] Patient Information 2014 [place] PassExitCare, [place]LL[place].

## 2013-10-24 NOTE — ED Notes (Signed)
Reports lower abd pain x 1 week that has gotten progressively worse. Increases during sex. Having vaginal discharge, spotting and n/v. Denies any urinary symptoms.

## 2013-10-25 LAB — GC/CHLAMYDIA PROBE AMP
CT Probe RNA: NEGATIVE
GC Probe RNA: NEGATIVE

## 2013-11-14 ENCOUNTER — Emergency Department (HOSPITAL_COMMUNITY)
Admission: EM | Admit: 2013-11-14 | Discharge: 2013-11-15 | Disposition: A | Discharge status: Home / Self Care | Payer: Medicaid Other | Attending: Emergency Medicine | Admitting: Emergency Medicine

## 2013-11-14 ENCOUNTER — Emergency Department (HOSPITAL_COMMUNITY): Payer: Medicaid Other

## 2013-11-14 ENCOUNTER — Encounter (HOSPITAL_COMMUNITY): Payer: Self-pay | Admitting: Emergency Medicine

## 2013-11-14 DIAGNOSIS — G8929 Other chronic pain: Secondary | ICD-10-CM | POA: Insufficient documentation

## 2013-11-14 DIAGNOSIS — M795 Residual foreign body in soft tissue: Secondary | ICD-10-CM

## 2013-11-14 DIAGNOSIS — Y929 Unspecified place or not applicable: Secondary | ICD-10-CM | POA: Insufficient documentation

## 2013-11-14 DIAGNOSIS — Z8701 Personal history of pneumonia (recurrent): Secondary | ICD-10-CM | POA: Insufficient documentation

## 2013-11-14 DIAGNOSIS — Z8659 Personal history of other mental and behavioral disorders: Secondary | ICD-10-CM | POA: Insufficient documentation

## 2013-11-14 DIAGNOSIS — Z8632 Personal history of gestational diabetes: Secondary | ICD-10-CM | POA: Insufficient documentation

## 2013-11-14 DIAGNOSIS — F17200 Nicotine dependence, unspecified, uncomplicated: Secondary | ICD-10-CM | POA: Insufficient documentation

## 2013-11-14 DIAGNOSIS — Z8719 Personal history of other diseases of the digestive system: Secondary | ICD-10-CM | POA: Insufficient documentation

## 2013-11-14 DIAGNOSIS — S61209A Unspecified open wound of unspecified finger without damage to nail, initial encounter: Secondary | ICD-10-CM | POA: Insufficient documentation

## 2013-11-14 DIAGNOSIS — S91309A Unspecified open wound, unspecified foot, initial encounter: Secondary | ICD-10-CM | POA: Insufficient documentation

## 2013-11-14 DIAGNOSIS — Y9389 Activity, other specified: Secondary | ICD-10-CM | POA: Insufficient documentation

## 2013-11-14 DIAGNOSIS — W268XXA Contact with other sharp object(s), not elsewhere classified, initial encounter: Secondary | ICD-10-CM | POA: Insufficient documentation

## 2013-11-14 NOTE — ED Notes (Signed)
Pt here for questionable glass in foot from stepping on a frame. 2 areas to R foot with cuts noted. Lac also to R thumb

## 2013-11-14 NOTE — ED Notes (Signed)
Pt states she stepped on a piece of glass a week ago. Pt has two small areas on her right foot that she is concerned about having glass still in them. Pt attempted to remove glass at home with pain and a knot still in the bottom of her foot. Pt states she also cut her right thumb and is concerned about the laceration since it has not healed.

## 2013-11-14 NOTE — ED Provider Notes (Signed)
CSN: [id number]     Arrival date & time 11/14/13  2147 History  This chart was scribed for non-physician practitioner working with No att. providers found by Elveria Risingimelie Horne, ED Scribe. This patient was seen in room TR04C/TR04C and the patient's care was started at 10:27 PM.   Chief Complaint  Patient presents with  . Foot Injury  . Foreign Body      The history is provided by the patient. No language interpreter was used.   HPI Comments: Sara Bradshaw is a 23 y.o. female who presents to the Emergency Department with a right foot injury that occurred five days ago. Patient reports stepping on pieces of glass from a shattered picture frame. Patient attempted to remove the glass from her foot but she lacerated her right thumb and pushed the glass she was trying retrieve further into the foot. At a second attempt to remove the glass from her foot, her uncle said too much puss was oozing from the wound to visualize the glass.  Patient is concerned that glass is still present in her foot because she has two knots on the plantar surface of her foot.   Past Medical History  Diagnosis Date  . Bipolar 1 disorder   . Back pain, chronic   . Pneumonia   . Diabetes mellitus     "with last pregnancy"  . Constipation    Past Surgical History  Procedure Laterality Date  . Appendectomy     History reviewed. No pertinent family history. History  Substance Use Topics  . Smoking status: Current Every Day Smoker  . Smokeless tobacco: Never Used  . Alcohol Use: No   OB History   Grav Para Term Preterm Abortions TAB SAB Ect Mult Living   4 2 1 1 1  1   2      Review of Systems  Skin:       Laceration to right thumb. Foreign body present in right foot.       Allergies  Morphine and related  Home Medications   Prior to Admission medications   Medication Sig Start Date End Date Taking? Authorizing Provider  acetaminophen (TYLENOL) 325 MG tablet Take 650 mg by mouth every 6 (six) hours  as needed for mild pain or headache.    Historical Provider, MD  HYDROcodone-acetaminophen (NORCO) 5-325 MG per tablet Take 1-2 tablets by mouth every 4 (four) hours as needed for severe pain. 10/24/13   Doug SouSam Jacubowitz, MD  ibuprofen (ADVIL,MOTRIN) 200 MG tablet Take 400-800 mg by mouth every 8 (eight) hours as needed for moderate pain.    Historical Provider, MD  Naproxen Sodium (ALEVE PO) Take 2 tablets by mouth 2 (two) times daily as needed (for pain).    Historical Provider, MD  ondansetron (ZOFRAN) 8 MG tablet Take 1 tablet (8 mg total) by mouth every 4 (four) hours as needed for nausea. 10/24/13   Doug SouSam Jacubowitz, MD   Triage Vitals: BP 145/92  Pulse 98  Temp(Src) 97.9 F (36.6 C) (Oral)  Resp 18  Ht 5\' 6"  (1.676 m)  Wt 241 lb 6 oz (109.487 kg)  BMI 38.98 kg/m2  SpO2 100%  LMP 11/07/2013 Physical Exam  Nursing note and vitals reviewed. Constitutional: She is oriented to person, place, and time. She appears well-developed and well-nourished. No distress.  HENT:  Head: Normocephalic and atraumatic.  Eyes: EOM are normal.  Neck: Neck supple. No tracheal deviation present.  Cardiovascular: Normal rate.   Pulmonary/Chest: Effort normal. No respiratory  distress.  Musculoskeletal: Normal range of motion.  Neurological: She is alert and oriented to person, place, and time.  Skin: Skin is warm and dry.  Small puncture wound to the lateral right foot and to the bottom of the great toe. No signs of infection or drainage. Tender to palpation to  Psychiatric: She has a normal mood and affect. Her behavior is normal.    ED Course  FOREIGN BODY REMOVAL Date/Time: 11/15/2013 12:21 AM Performed by: Jaynie CrumbleKIRICHENKO, Krish Bailly A Authorized by: Jaynie CrumbleKIRICHENKO, Ahuva Poynor A Consent: Verbal consent obtained. Patient identity confirmed: verbally with patient Body area: skin Anesthesia: local infiltration Local anesthetic: lidocaine 2% without epinephrine Anesthetic total: 3 ml Patient sedated: no Removal  mechanism: scalpel Dressing: antibiotic ointment Depth: subcutaneous Complexity: simple 2 objects recovered. Objects recovered: Tiny pieces of glass   (including critical care time) DIAGNOSTIC STUDIES: Oxygen Saturation is 100% on room air, normal by my interpretation.    COORDINATION OF CARE: 10:34 PM- Discussed treatment plan with patient at bedside and patient agreed to plan.     Labs Review Labs Reviewed - No data to display  Imaging Review No results found.   EKG Interpretation None      MDM   Final diagnoses:  Foreign body (FB) in soft tissue   Patient with glass in skin of the right foot. I will explore both wounds. Removed 2 tiny pieces of glass from the wounds. I am not sure if any more residual glass remains, x-rays obtained and are negative. I have instructed patient to soak her foot at home multiple times. Her tetanus is up-to-date. Ibuprofen for pain. Followup as needed   Filed Vitals:   11/14/13 2155  BP: 145/92  Pulse: 98  Temp: 97.9 F (36.6 C)  TempSrc: Oral  Resp: 18  Height: 5\' 6"  (1.676 m)  Weight: 241 lb 6 oz (109.487 kg)  SpO2: 100%      Trammell Bowden A Tyrek Lawhorn, PA-C 11/15/13 0025

## 2013-11-15 MED ORDER — IBUPROFEN 400 MG PO TABS
600.0000 mg | ORAL_TABLET | Freq: Once | ORAL | Status: AC
  Administered 2013-11-15: 01:00:00 600 mg via ORAL
  Filled 2013-11-15 (×2): qty 1

## 2013-11-15 MED ORDER — IBUPROFEN 600 MG PO TABS: 600.0000 mg | ORAL_TABLET | Freq: Four times a day (QID) | ORAL | Status: DC | PRN

## 2013-11-15 NOTE — Discharge Instructions (Signed)
Soak foot multiple times a day. Watch for signs of infection. Follow up with primary care doctor for recheck.       Puncture Wound A puncture wound is an injury that extends through all layers of the skin and into the tissue beneath the skin (subcutaneous tissue). Puncture wounds become infected easily because germs often enter the body and go beneath the skin during the injury. Having a deep wound with a small entrance point makes it difficult for your caregiver to adequately clean the wound. This is especially true if you have stepped on a nail and it has passed through a dirty shoe or other situations where the wound is obviously contaminated. CAUSES  Many puncture wounds involve glass, nails, splinters, fish hooks, or other objects that enter the skin (foreign bodies). A puncture wound may also be caused by a human bite or animal bite. DIAGNOSIS  A puncture wound is usually diagnosed by your history and a physical exam. You may need to have an X-ray or an ultrasound to check for any foreign bodies still in the wound. TREATMENT   Your caregiver will clean the wound as thoroughly as possible. Depending on the location of the wound, a bandage (dressing) may be applied.  Your caregiver might prescribe antibiotic medicines.  You may need a follow-up visit to check on your wound. Follow all instructions as directed by your caregiver. HOME CARE INSTRUCTIONS   Change your dressing once per day, or as directed by your caregiver. If the dressing sticks, it may be removed by soaking the area in water.  If your caregiver has given you follow-up instructions, it is very important that you return for a follow-up appointment. Not following up as directed could result in a chronic or permanent injury, pain, and disability.  Only take over-the-counter or prescription medicines for pain, discomfort, or fever as directed by your caregiver.  If you are given antibiotics, take them as directed. Finish them  even if you start to feel better. You may need a tetanus shot if:  You cannot remember when you had your last tetanus shot.  You have never had a tetanus shot. If you got a tetanus shot, your arm may swell, get red, and feel warm to the touch. This is common and not a problem. If you need a tetanus shot and you choose not to have one, there is a rare chance of getting tetanus. Sickness from tetanus can be serious. You may need a rabies shot if an animal bite caused your puncture wound. SEEK MEDICAL CARE IF:   You have redness, swelling, or increasing pain in the wound.  You have red streaks going away from the wound.  You notice a bad smell coming from the wound or dressing.  You have yellowish-white fluid (pus) coming from the wound.  You are treated with an antibiotic for infection, but the infection is not getting better.  You notice something in the wound, such as rubber from your shoe, cloth, or another object.  You have a fever.  You have severe pain.  You have difficulty breathing.  You feel dizzy or faint.  You cannot stop vomiting.  You lose feeling, develop numbness, or cannot move a limb below the wound.  Your symptoms worsen. MAKE SURE YOU:  Understand these instructions.  Will watch your condition.  Will get help right away if you are not doing well or get worse. Document Released: 04/24/2005 Document Revised: 10/07/2011 Document Reviewed: 01/01/2011 [employer] Patient Information 2014  [employer]. ° °

## 2013-11-15 NOTE — ED Provider Notes (Signed)
Medical screening examination/treatment/procedure(s) were performed by non-physician practitioner and as supervising physician I was immediately available for consultation/collaboration.   EKG Interpretation None        Linday Rhodes, MD 11/15/13 0532 

## 2014-02-09 ENCOUNTER — Emergency Department (HOSPITAL_COMMUNITY)
Admission: EM | Admit: 2014-02-09 | Discharge: 2014-02-09 | Disposition: A | Discharge status: Home / Self Care | Payer: Medicaid Other | Attending: Emergency Medicine | Admitting: Emergency Medicine

## 2014-02-09 ENCOUNTER — Emergency Department (HOSPITAL_COMMUNITY): Payer: Medicaid Other

## 2014-02-09 ENCOUNTER — Encounter (HOSPITAL_COMMUNITY): Payer: Self-pay | Admitting: Emergency Medicine

## 2014-02-09 DIAGNOSIS — Z8659 Personal history of other mental and behavioral disorders: Secondary | ICD-10-CM | POA: Insufficient documentation

## 2014-02-09 DIAGNOSIS — F17200 Nicotine dependence, unspecified, uncomplicated: Secondary | ICD-10-CM | POA: Insufficient documentation

## 2014-02-09 DIAGNOSIS — R110 Nausea: Secondary | ICD-10-CM | POA: Insufficient documentation

## 2014-02-09 DIAGNOSIS — R102 Pelvic and perineal pain unspecified side: Secondary | ICD-10-CM

## 2014-02-09 DIAGNOSIS — G8929 Other chronic pain: Secondary | ICD-10-CM | POA: Insufficient documentation

## 2014-02-09 DIAGNOSIS — N949 Unspecified condition associated with female genital organs and menstrual cycle: Secondary | ICD-10-CM | POA: Insufficient documentation

## 2014-02-09 DIAGNOSIS — Z8701 Personal history of pneumonia (recurrent): Secondary | ICD-10-CM | POA: Insufficient documentation

## 2014-02-09 DIAGNOSIS — Z9089 Acquired absence of other organs: Secondary | ICD-10-CM | POA: Insufficient documentation

## 2014-02-09 DIAGNOSIS — Z8719 Personal history of other diseases of the digestive system: Secondary | ICD-10-CM | POA: Insufficient documentation

## 2014-02-09 DIAGNOSIS — Z3202 Encounter for pregnancy test, result negative: Secondary | ICD-10-CM | POA: Insufficient documentation

## 2014-02-09 DIAGNOSIS — N939 Abnormal uterine and vaginal bleeding, unspecified: Secondary | ICD-10-CM

## 2014-02-09 DIAGNOSIS — E119 Type 2 diabetes mellitus without complications: Secondary | ICD-10-CM | POA: Insufficient documentation

## 2014-02-09 DIAGNOSIS — Z9851 Tubal ligation status: Secondary | ICD-10-CM | POA: Insufficient documentation

## 2014-02-09 DIAGNOSIS — N898 Other specified noninflammatory disorders of vagina: Secondary | ICD-10-CM | POA: Insufficient documentation

## 2014-02-09 LAB — GC/CHLAMYDIA PROBE AMP
CT Probe RNA: NEGATIVE
GC Probe RNA: NEGATIVE

## 2014-02-09 LAB — URINE MICROSCOPIC-ADD ON

## 2014-02-09 LAB — URINALYSIS, ROUTINE W REFLEX MICROSCOPIC
BILIRUBIN URINE: NEGATIVE
GLUCOSE, UA: NEGATIVE mg/dL
Ketones, ur: NEGATIVE mg/dL
Nitrite: NEGATIVE
Protein, ur: NEGATIVE mg/dL
Specific Gravity, Urine: 1.031 — ABNORMAL HIGH (ref 1.005–1.030)
UROBILINOGEN UA: 1 mg/dL (ref 0.0–1.0)
pH: 6 (ref 5.0–8.0)

## 2014-02-09 LAB — WET PREP, GENITAL
Clue Cells Wet Prep HPF POC: NONE SEEN
TRICH WET PREP: NONE SEEN
Yeast Wet Prep HPF POC: NONE SEEN

## 2014-02-09 LAB — HIV ANTIBODY (ROUTINE TESTING W REFLEX): HIV 1&2 Ab, 4th Generation: NONREACTIVE

## 2014-02-09 LAB — POC URINE PREG, ED: Preg Test, Ur: NEGATIVE

## 2014-02-09 MED ORDER — TRAMADOL HCL 50 MG PO TABS
50.0000 mg | ORAL_TABLET | Freq: Once | ORAL | Status: AC
  Administered 2014-02-09: 50 mg via ORAL
  Filled 2014-02-09: qty 1

## 2014-02-09 MED ORDER — TRAMADOL HCL 50 MG PO TABS: 50.0000 mg | ORAL_TABLET | Freq: Four times a day (QID) | ORAL | Status: DC | PRN

## 2014-02-09 MED ORDER — IBUPROFEN 200 MG PO TABS
600.0000 mg | ORAL_TABLET | Freq: Once | ORAL | Status: AC
Start: 2014-02-09 — End: 2014-02-09
  Administered 2014-02-09: 600 mg via ORAL
  Filled 2014-02-09: qty 3

## 2014-02-09 MED ORDER — IBUPROFEN 600 MG PO TABS: 600.0000 mg | ORAL_TABLET | Freq: Three times a day (TID) | ORAL | Status: DC | PRN

## 2014-02-09 NOTE — Discharge Instructions (Signed)
Take motrin as need for pain. You may also take ultram as need for pain - no driving when taking. Follow up with ob/gyn doctor in the next couple weeks. Return to ER if worse, new symptoms, fevers, worsening or severe pain, other concern.  You were given pain medication in the ER - no driving for the next 4 hours.     Abnormal Uterine Bleeding Abnormal uterine bleeding means bleeding from the vagina that is not your normal menstrual period. This can be:  Bleeding or spotting between periods.  Bleeding after sex (sexual intercourse).  Bleeding that is heavier or more than normal.  Periods that last longer than usual.  Bleeding after menopause. There are many problems that may cause this. Treatment will depend on the cause of the bleeding. Any kind of bleeding that is not normal should be reviewed by your doctor.  HOME CARE Watch your condition for any changes. These actions may lessen any discomfort you are having:  Do not use tampons or douches as told by your doctor.  Change your pads often. You should get regular pelvic exams and Pap tests. Keep all appointments for tests as told by your doctor. GET HELP IF:  You are bleeding for more than 1 week.  You feel dizzy at times. GET HELP RIGHT AWAY IF:   You pass out.  You have to change pads every 15 to 30 minutes.  You have belly pain.  You have a fever.  You become sweaty or weak.  You are passing large blood clots from the vagina.  You feel sick to your stomach (nauseous) and throw up (vomit). MAKE SURE YOU:  Understand these instructions.  Will watch your condition.  Will get help right away if you are not doing well or get worse. Document Released: 05/12/2009 Document Revised: 07/20/2013 Document Reviewed: 02/11/2013 [facility] Patient Information 2015 [place], [place]. This information is not intended to replace advice given to you by your health care provider. Make sure you discuss any questions you have with  your health care provider.    Pelvic Pain Pelvic pain is pain felt below the belly button and between your hips. It can be caused by many different things. It is important to get help right away. This is especially true for severe, sharp, or unusual pain that comes on suddenly.  HOME CARE  Only take medicine as told by your doctor.  Rest as told by your doctor.  Eat a healthy diet, such as fruits, vegetables, and lean meats.  Drink enough fluids to keep your pee (urine) clear or pale yellow, or as told.  Avoid sex (intercourse) if it causes pain.  Apply warm or cold packs to your lower belly (abdomen). Use the type of pack that helps the pain.  Avoid situations that cause you stress.  Keep a journal to track your pain. Write down:  When the pain started.  Where it is located.  If there are things that seem to be related to the pain, such as food or your period.  Follow up with your doctor as told. GET HELP RIGHT AWAY IF:   You have heavy bleeding from the vagina.  You have more pelvic pain.  You feel lightheaded or pass out (faint).  You have chills.  You have pain when you pee or have blood in your pee.  You cannot stop having watery poop (diarrhea).  You cannot stop throwing up (vomiting).  You have a fever or lasting symptoms for more than  3 days.  You have a fever and your symptoms suddenly get worse.  You are being physically or sexually abused.  Your medicine does not help your pain.  You have fluid (discharge) coming from your vagina that is not normal. MAKE SURE YOU:  Understand these instructions.  Will watch your condition.  Will get help if you are not doing well or get worse. Document Released: 01/01/2008 Document Revised: 01/14/2012 Document Reviewed: 11/04/2011 [facility] Patient Information 2015 [place], [place]. This information is not intended to replace advice given to you by your health care provider. Make sure you discuss any questions  you have with your health care provider.

## 2014-02-09 NOTE — ED Notes (Signed)
Informed patient a urine specimen is needed. Pt reports she does not have to urinate. Informed patient to let staff know by using her call bell when she needs to urinate. Pt agreed.

## 2014-02-09 NOTE — ED Provider Notes (Signed)
CSN: [id number]     Arrival date & time 02/09/14  0215 History   First MD Initiated Contact with Patient 02/09/14 0228     Chief Complaint  Patient presents with  . SEXUALLY TRANSMITTED DISEASE     (Consider location/radiation/quality/duration/timing/severity/associated sxs/prior Treatment) HPI Patient has a history of recurrent trichomonal infections and presents with 3 days of lower abdominal cramping, foul-smelling vaginal discharge and vaginal bleeding. She denies any fevers or chills. She's had nausea but no vomiting. Past Medical History  Diagnosis Date  . Bipolar 1 disorder   . Back pain, chronic   . Pneumonia   . Diabetes mellitus     "with last pregnancy"  . Constipation    Past Surgical History  Procedure Laterality Date  . Appendectomy    . Tubal ligation     History reviewed. No pertinent family history. History  Substance Use Topics  . Smoking status: Current Every Day Smoker  . Smokeless tobacco: Never Used  . Alcohol Use: No   OB History   Grav Para Term Preterm Abortions TAB SAB Ect Mult Living   4 2 1 1 1  1   2      Review of Systems  Constitutional: Negative for fever and chills.  Gastrointestinal: Positive for nausea and abdominal pain. Negative for vomiting and diarrhea.  Genitourinary: Positive for vaginal bleeding, vaginal discharge and pelvic pain. Negative for dysuria, frequency, hematuria, flank pain and genital sores.  Musculoskeletal: Negative for back pain, neck pain and neck stiffness.  Skin: Negative for pallor and rash.  Neurological: Negative for dizziness, weakness, light-headedness, numbness and headaches.  All other systems reviewed and are negative.     Allergies  Morphine and related  Home Medications   Prior to Admission medications   Medication Sig Start Date End Date Taking? Authorizing Provider  ibuprofen (ADVIL,MOTRIN) 200 MG tablet Take 400-800 mg by mouth every 8 (eight) hours as needed for moderate pain.   Yes  Historical Provider, MD   BP 123/83  Pulse 86  Temp(Src) 98 F (36.7 C) (Oral)  Resp 19  Ht 5\' 6"  (1.676 m)  Wt 235 lb (106.595 kg)  BMI 37.95 kg/m2  SpO2 99%  LMP 02/09/2014 Physical Exam  Nursing note and vitals reviewed. Constitutional: She is oriented to person, place, and time. She appears well-developed and well-nourished. No distress.  HENT:  Head: Normocephalic and atraumatic.  Mouth/Throat: Oropharynx is clear and moist.  Eyes: EOM are normal. Pupils are equal, round, and reactive to light.  Neck: Normal range of motion. Neck supple.  Cardiovascular: Normal rate and regular rhythm.   Pulmonary/Chest: Effort normal and breath sounds normal. No respiratory distress. She has no wheezes. She has no rales.  Abdominal: Soft. Bowel sounds are normal. She exhibits no distension and no mass. There is tenderness (mild suprapubic tenderness to palpation.). There is no rebound and no guarding.  Genitourinary: Vagina normal. No vaginal discharge found.  Patient with mild cervical motion tenderness and diffuse adnexal and fundal tenderness. No vaginal discharge appreciated.  Musculoskeletal: Normal range of motion. She exhibits no edema and no tenderness.  No CVA tenderness  Neurological: She is alert and oriented to person, place, and time.  Skin: Skin is warm and dry. No rash noted. No erythema.  Psychiatric: She has a normal mood and affect. Her behavior is normal.    ED Course  Procedures (including critical care time) Labs Review Labs Reviewed  GC/CHLAMYDIA PROBE AMP  WET PREP, GENITAL  URINALYSIS, ROUTINE W  REFLEX MICROSCOPIC  HIV ANTIBODY (ROUTINE TESTING)  POC URINE PREG, ED    Imaging Review No results found.   EKG Interpretation None      MDM   Final diagnoses:  None   Patient signed out to oncoming emergency physician pending pelvic ultrasound.    Loren Racer, MD 02/10/14 [phone]

## 2014-02-09 NOTE — ED Notes (Addendum)
Pt presents to ED with lower abd pain, abnormal vaginal bleeding, thick yellow vaginal discharge, and vaginal odor since May of this year. Pt states she was diagnosed in May for Trichomoniasis and has completed several prescriptions of both Flagyl and Bactrim PO  And has received several Rocephin Injection and 4 unknown abx pills with no relief at each ED visit. Pt states she is married and her husband has been treated each time she has been treated.

## 2014-02-09 NOTE — ED Provider Notes (Addendum)
Signed out by Dr Ranae PalmsYelverton that u/s pending, if neg, d/c home.  U/s negative.  Recheck abd soft nt.  Pt afeb.    Pt requests pain med in ed.  States family member is coming to pick her up. Motrin po, ultram po.  Appears stable for d/c.      Suzi RootsKevin E Oaklyn Mans, MD 02/09/14 0830

## 2014-02-09 NOTE — ED Notes (Signed)
Patient prompted for urine again, patient states that she still does not need to urinate at this time despite being here 4 hours and 57 minutes

## 2014-02-09 NOTE — ED Notes (Addendum)
Patient transported to [facility] 

## 2014-02-09 NOTE — ED Notes (Signed)
MD Yelverton at bedside. 

## 2014-02-17 ENCOUNTER — Encounter (HOSPITAL_COMMUNITY): Payer: Self-pay | Admitting: Emergency Medicine

## 2014-02-17 ENCOUNTER — Emergency Department (HOSPITAL_COMMUNITY)
Admission: EM | Admit: 2014-02-17 | Discharge: 2014-02-17 | Discharge status: Against Medical Advice | Payer: Medicaid Other | Attending: Emergency Medicine | Admitting: Emergency Medicine

## 2014-02-17 DIAGNOSIS — R079 Chest pain, unspecified: Secondary | ICD-10-CM | POA: Insufficient documentation

## 2014-02-17 DIAGNOSIS — F17200 Nicotine dependence, unspecified, uncomplicated: Secondary | ICD-10-CM | POA: Insufficient documentation

## 2014-02-17 DIAGNOSIS — R1013 Epigastric pain: Secondary | ICD-10-CM | POA: Insufficient documentation

## 2014-02-17 LAB — I-STAT CHEM 8, ED
BUN: 9 mg/dL (ref 6–23)
CALCIUM ION: 1.13 mmol/L (ref 1.12–1.23)
CREATININE: 0.9 mg/dL (ref 0.50–1.10)
Chloride: 104 mEq/L (ref 96–112)
Glucose, Bld: 103 mg/dL — ABNORMAL HIGH (ref 70–99)
HCT: 39 % (ref 36.0–46.0)
Hemoglobin: 13.3 g/dL (ref 12.0–15.0)
Potassium: 3.5 mEq/L — ABNORMAL LOW (ref 3.7–5.3)
Sodium: 141 mEq/L (ref 137–147)
TCO2: 24 mmol/L (ref 0–100)

## 2014-02-17 LAB — CBC
HEMATOCRIT: 38.4 % (ref 36.0–46.0)
Hemoglobin: 12.6 g/dL (ref 12.0–15.0)
MCH: 27.9 pg (ref 26.0–34.0)
MCHC: 32.8 g/dL (ref 30.0–36.0)
MCV: 85 fL (ref 78.0–100.0)
Platelets: 221 10*3/uL (ref 150–400)
RBC: 4.52 MIL/uL (ref 3.87–5.11)
RDW: 14.2 % (ref 11.5–15.5)
WBC: 6.9 10*3/uL (ref 4.0–10.5)

## 2014-02-17 LAB — I-STAT TROPONIN, ED: Troponin i, poc: 0 ng/mL (ref 0.00–0.08)
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
## 2014-02-17 NOTE — ED Notes (Signed)
Pt left ama, states she feels fine, pt encouraged to stay, pt seen walking with a steady gait

## 2014-02-27 ENCOUNTER — Encounter (HOSPITAL_COMMUNITY): Payer: Self-pay | Admitting: Emergency Medicine

## 2014-02-27 ENCOUNTER — Emergency Department (HOSPITAL_COMMUNITY)
Admission: EM | Admit: 2014-02-27 | Discharge: 2014-02-27 | Disposition: A | Discharge status: Home / Self Care | Payer: Medicaid Other | Attending: Emergency Medicine | Admitting: Emergency Medicine

## 2014-02-27 ENCOUNTER — Emergency Department (HOSPITAL_COMMUNITY): Payer: Medicaid Other

## 2014-02-27 DIAGNOSIS — Y93K1 Activity, walking an animal: Secondary | ICD-10-CM | POA: Insufficient documentation

## 2014-02-27 DIAGNOSIS — S99929A Unspecified injury of unspecified foot, initial encounter: Principal | ICD-10-CM

## 2014-02-27 DIAGNOSIS — X500XXA Overexertion from strenuous movement or load, initial encounter: Secondary | ICD-10-CM | POA: Insufficient documentation

## 2014-02-27 DIAGNOSIS — M25561 Pain in right knee: Secondary | ICD-10-CM

## 2014-02-27 DIAGNOSIS — G8929 Other chronic pain: Secondary | ICD-10-CM | POA: Insufficient documentation

## 2014-02-27 DIAGNOSIS — Z79899 Other long term (current) drug therapy: Secondary | ICD-10-CM | POA: Diagnosis not present

## 2014-02-27 DIAGNOSIS — Y929 Unspecified place or not applicable: Secondary | ICD-10-CM | POA: Insufficient documentation

## 2014-02-27 DIAGNOSIS — Z791 Long term (current) use of non-steroidal anti-inflammatories (NSAID): Secondary | ICD-10-CM | POA: Insufficient documentation

## 2014-02-27 DIAGNOSIS — S99919A Unspecified injury of unspecified ankle, initial encounter: Secondary | ICD-10-CM | POA: Diagnosis not present

## 2014-02-27 DIAGNOSIS — Z8659 Personal history of other mental and behavioral disorders: Secondary | ICD-10-CM | POA: Diagnosis not present

## 2014-02-27 DIAGNOSIS — F17200 Nicotine dependence, unspecified, uncomplicated: Secondary | ICD-10-CM | POA: Diagnosis not present

## 2014-02-27 DIAGNOSIS — S8990XA Unspecified injury of unspecified lower leg, initial encounter: Secondary | ICD-10-CM | POA: Insufficient documentation

## 2014-02-27 DIAGNOSIS — Z8701 Personal history of pneumonia (recurrent): Secondary | ICD-10-CM | POA: Diagnosis not present

## 2014-02-27 MED ORDER — KETOROLAC TROMETHAMINE 60 MG/2ML IM SOLN
60.0000 mg | Freq: Once | INTRAMUSCULAR | Status: AC
  Administered 2014-02-27: 60 mg via INTRAMUSCULAR
  Filled 2014-02-27: qty 2

## 2014-02-27 MED ORDER — IBUPROFEN 800 MG PO TABS: 800.0000 mg | ORAL_TABLET | Freq: Three times a day (TID) | ORAL | Status: DC

## 2014-02-27 NOTE — ED Notes (Signed)
The pt injured her rt knee earlier tonight while walking her dog she twisted it when she fell

## 2014-02-27 NOTE — ED Provider Notes (Signed)
Medical screening examination/treatment/procedure(s) were performed by non-physician practitioner and as supervising physician I was immediately available for consultation/collaboration.   EKG Interpretation None       Rebeckah Masih M Ambrosia Wisnewski, MD 02/27/14 0610 

## 2014-02-27 NOTE — ED Provider Notes (Signed)
CSN: [id number]     Arrival date & time 02/27/14  0516 History   First MD Initiated Contact with Patient 02/27/14 0534     Chief Complaint  Patient presents with  . Knee Injury     (Consider location/radiation/quality/duration/timing/severity/associated sxs/prior Treatment) The history is provided by the patient and medical records. No language interpreter was used.    Sara Bradshaw is a 23 y.o. female  with a hx of chronic back pain, NIDDM (gestational only) presents to the Emergency Department complaining of acute, persistent right knee pain beginning around 11:30PM after twisting it while attempting to keep her dog from running off.  Pt reports no fall, did not strike her knee on anything and has been ambulatory since the incident, but with pain. She took tylenol without releif and presents this morning because it is "hard to sleep with the pain."   Associated symptoms include swelling of the knee.  Nothing makes it better and walking makes it worse.  Pt denies numbness, tingling, weakness, laceration, abrasion.     Past Medical History  Diagnosis Date  . Bipolar 1 disorder   . Back pain, chronic   . Pneumonia   . Diabetes mellitus     "with last pregnancy"  . Constipation    Past Surgical History  Procedure Laterality Date  . Appendectomy    . Tubal ligation     No family history on file. History  Substance Use Topics  . Smoking status: Current Every Day Smoker  . Smokeless tobacco: Never Used  . Alcohol Use: No   OB History   Grav Para Term Preterm Abortions TAB SAB Ect Mult Living   4 2 1 1 1  1   2      Review of Systems  Constitutional: Negative for fever and chills.  Gastrointestinal: Negative for nausea and vomiting.  Musculoskeletal: Positive for arthralgias (right knee) and joint swelling. Negative for back pain, neck pain and neck stiffness.  Skin: Negative for wound.  Neurological: Negative for numbness.  Hematological: Does not bruise/bleed easily.   Psychiatric/Behavioral: The patient is not nervous/anxious.   All other systems reviewed and are negative.     Allergies  Morphine and related  Home Medications   Prior to Admission medications   Medication Sig Start Date End Date Taking? Authorizing Provider  ibuprofen (ADVIL,MOTRIN) 200 MG tablet Take 400-800 mg by mouth every 8 (eight) hours as needed for moderate pain.    Historical Provider, MD  ibuprofen (ADVIL,MOTRIN) 600 MG tablet Take 1 tablet (600 mg total) by mouth every 8 (eight) hours as needed. Take with food. 02/09/14   Suzi Roots, MD  ibuprofen (ADVIL,MOTRIN) 800 MG tablet Take 1 tablet (800 mg total) by mouth 3 (three) times daily. 02/27/14   Ellasyn Swilling, PA-C  traMADol (ULTRAM) 50 MG tablet Take 1 tablet (50 mg total) by mouth every 6 (six) hours as needed. 02/09/14   Suzi Roots, MD   BP 132/76  Pulse 82  Temp(Src) 98.7 F (37.1 C) (Oral)  Resp 20  Ht 5\' 6"  (1.676 m)  Wt 231 lb (104.781 kg)  BMI 37.30 kg/m2  SpO2 99%  LMP 02/09/2014 Physical Exam  Nursing note and vitals reviewed. Constitutional: She appears well-developed and well-nourished. No distress.  HENT:  Head: Normocephalic and atraumatic.  Eyes: Conjunctivae are normal.  Neck: Normal range of motion.  Cardiovascular: Normal rate, regular rhythm, normal heart sounds and intact distal pulses.   No murmur heard. Capillary refill <  3 sec  Pulmonary/Chest: Effort normal and breath sounds normal. No respiratory distress.  Musculoskeletal: She exhibits tenderness. She exhibits no edema.  ROM: Full range of motion of the right knee with moderate pain in the popliteal space No visible swelling or ecchymosis Patellar tendon intact, no foot drop  Neurological: She is alert. Coordination normal.  Sensation intact to dull and sharp throughout Strength 4/5 with resisted flexion and extension of the knee in the right lower extremity secondary pain; 5/5 dorsiflexion and plantar flexion  bilaterally Patient ambulates with antalgic gait  Skin: Skin is warm and dry. She is not diaphoretic.  No tenting of the skin  Psychiatric: She has a normal mood and affect.    ED Course  Procedures (including critical care time) Labs Review Labs Reviewed - No data to display  Imaging Review No results found.   EKG Interpretation None      MDM   Final diagnoses:  Arthralgia of knee, right   Jackquline BerlinKelsea Anne Powell presents with knee pain after twisting her knee.  Pt reports she saw [facility] when she was younger, but has not seen them in a number of years.  Pt neurologically intact and able to weight bear.    Pt given knee sleeve, ice and pain control in the ED.  X-ray pending.  Will dispo accordingly.    Discussed with Arthor CaptainAbigail Harris, PA-C who will follow x-ray and d/c home.    BP 132/76  Pulse 82  Temp(Src) 98.7 F (37.1 C) (Oral)  Resp 20  Ht 5\' 6"  (1.676 m)  Wt 231 lb (104.781 kg)  BMI 37.30 kg/m2  SpO2 99%  LMP 02/09/2014    Dierdre ForthHannah Krisi Azua, PA-C 02/27/14 [id number]

## 2014-02-27 NOTE — ED Provider Notes (Signed)
7:32 AM BP 126/79  Pulse 79  Temp(Src) 98.7 F (37.1 C) (Oral)  Resp 15  Ht 5\' 6"  (1.676 m)  Wt 231 lb (104.781 kg)  BMI 37.30 kg/m2  SpO2 100%  LMP 02/09/2014 Assumed care from PA Muthersbaugh _ knee xr Close f/u with established ortho. Safe for discharge.  I personally reviewed the imaging tests through PACS system.  I reviewed available ER/hospitalization records through the EMR    Lily LakeAbigail Shandy Vi, [place]PA-C 02/27/14 [id number]

## 2014-02-27 NOTE — ED Provider Notes (Signed)
Medical screening examination/treatment/procedure(s) were performed by non-physician practitioner and as supervising physician I was immediately available for consultation/collaboration.   EKG Interpretation None       Aerial Dilley M Gahel Safley, MD 02/27/14 1945 

## 2014-02-27 NOTE — ED Notes (Signed)
Pt c/o of pain in the back of her knee and calf muscle when bending it. Swelling noted to front and back of knee. Painful to touch.

## 2014-02-27 NOTE — Discharge Instructions (Signed)
1. Medications: ibuprofen, usual home medications 2. Treatment: rest, drink plenty of fluids, ice, elevate 3. Follow Up: Please followup with your primary doctor in 3 days or your orthopedist for discussion of your diagnoses and further evaluation after today's visit;     Arthralgia Your caregiver has diagnosed you as suffering from an arthralgia. Arthralgia means there is pain in a joint. This can come from many reasons including:  Bruising the joint which causes soreness (inflammation) in the joint.  Wear and tear on the joints which occur as we grow older (osteoarthritis).  Overusing the joint.  Various forms of arthritis.  Infections of the joint. Regardless of the cause of pain in your joint, most of these different pains respond to anti-inflammatory drugs and rest. The exception to this is when a joint is infected, and these cases are treated with antibiotics, if it is a bacterial infection. HOME CARE INSTRUCTIONS   Rest the injured area for as long as directed by your caregiver. Then slowly start using the joint as directed by your caregiver and as the pain allows. Crutches as directed may be useful if the ankles, knees or hips are involved. If the knee was splinted or casted, continue use and care as directed. If an stretchy or elastic wrapping bandage has been applied today, it should be removed and re-applied every 3 to 4 hours. It should not be applied tightly, but firmly enough to keep swelling down. Watch toes and feet for swelling, bluish discoloration, coldness, numbness or excessive pain. If any of these problems (symptoms) occur, remove the ace bandage and re-apply more loosely. If these symptoms persist, contact your caregiver or return to this location.  For the first 24 hours, keep the injured extremity elevated on pillows while lying down.  Apply ice for 15-20 minutes to the sore joint every couple hours while awake for the first half day. Then 03-04 times per day for  the first 48 hours. Put the ice in a plastic bag and place a towel between the bag of ice and your skin.  Wear any splinting, casting, elastic bandage applications, or slings as instructed.  Only take over-the-counter or prescription medicines for pain, discomfort, or fever as directed by your caregiver. Do not use aspirin immediately after the injury unless instructed by your physician. Aspirin can cause increased bleeding and bruising of the tissues.  If you were given crutches, continue to use them as instructed and do not resume weight bearing on the sore joint until instructed. Persistent pain and inability to use the sore joint as directed for more than 2 to 3 days are warning signs indicating that you should see a caregiver for a follow-up visit as soon as possible. Initially, a hairline fracture (break in bone) may not be evident on X-rays. Persistent pain and swelling indicate that further evaluation, non-weight bearing or use of the joint (use of crutches or slings as instructed), or further X-rays are indicated. X-rays may sometimes not show a small fracture until a week or 10 days later. Make a follow-up appointment with your own caregiver or one to whom we have referred you. A radiologist (specialist in reading X-rays) may read your X-rays. Make sure you know how you are to obtain your X-ray results. Do not assume everything is normal if you do not hear from us. SEEK MEDICAL CARE IF: Bruising, swelling, or pain increases. SEEK IMMEDIATE MEDICAL CARE IF:   Your fingers or toes are numb or blue.  The pain is  not responding to medications and continues to stay the same or get worse.  The pain in your joint becomes severe.  You develop a fever over 102 F (38.9 C).  It becomes impossible to move or use the joint. MAKE SURE YOU:   Understand these instructions.  Will watch your condition.  Will get help right away if you are not doing well or get worse. Document Released:  07/15/2005 Document Revised: 10/07/2011 Document Reviewed: 03/02/2008 [facility] Patient Information 2015 [place], [place]. This information is not intended to replace advice given to you by your health care provider. Make sure you discuss any questions you have with your health care provider.

## 2014-05-30 ENCOUNTER — Encounter (HOSPITAL_COMMUNITY): Payer: Self-pay | Admitting: Emergency Medicine

## 2014-06-06 ENCOUNTER — Inpatient Hospital Stay (HOSPITAL_COMMUNITY)
Admission: AD | Admit: 2014-06-06 | Discharge: 2014-06-06 | Disposition: A | Discharge status: Home / Self Care | Payer: Medicaid Other | Source: Ambulatory Visit | Attending: Family Medicine | Admitting: Family Medicine

## 2014-06-06 ENCOUNTER — Encounter (HOSPITAL_COMMUNITY): Payer: Self-pay | Admitting: *Deleted

## 2014-06-06 DIAGNOSIS — N816 Rectocele: Secondary | ICD-10-CM | POA: Diagnosis not present

## 2014-06-06 DIAGNOSIS — N8110 Cystocele, unspecified: Secondary | ICD-10-CM | POA: Insufficient documentation

## 2014-06-06 LAB — URINALYSIS, ROUTINE W REFLEX MICROSCOPIC
Bilirubin Urine: NEGATIVE
GLUCOSE, UA: NEGATIVE mg/dL
Hgb urine dipstick: NEGATIVE
KETONES UR: NEGATIVE mg/dL
Leukocytes, UA: NEGATIVE
Nitrite: NEGATIVE
PROTEIN: NEGATIVE mg/dL
Specific Gravity, Urine: >1.03 — ABNORMAL HIGH (ref 1.005–1.030)
Urobilinogen, UA: 0.2 mg/dL (ref 0.0–1.0)
pH: 5.5 (ref 5.0–8.0)

## 2014-06-06 LAB — POCT PREGNANCY, URINE: Preg Test, Ur: NEGATIVE

## 2014-06-06 NOTE — MAU Note (Signed)
Pt feeling some lower abdominal pain that started tonight. Pt denies bleeding or discharge and states the pain is ocnstant and is either crampy or sharp pain. Pt also feeling pressure coming out of vagina and brought pictures of what is coming out. ?prolapse

## 2014-06-06 NOTE — MAU Note (Signed)
Thressa ShellerHeather Hogan CNM is inspecting perineum and performing pelvic exam.

## 2014-06-06 NOTE — MAU Note (Signed)
Thressa ShellerHeather Hogan CNM coming to assess pt. Report given.

## 2014-06-06 NOTE — MAU Provider Note (Signed)
History     CSN: [id number]  Arrival date and time: 06/06/14 [id number]   First Provider Initiated Contact with Patient 06/06/14 [id number]      Chief Complaint  Patient presents with  . Abdominal Pain   HPI  Sara Bradshaw is a 23 y.o. [phone] who presents today with abdominal cramping. Also, when she got up to use the bathroom she felt like "somthing was coming out". She denies any VB. She had a tubal after her last delivery.   Past Medical History  Diagnosis Date  . Bipolar 1 disorder   . Back pain, chronic   . Pneumonia   . Constipation     Past Surgical History  Procedure Laterality Date  . Appendectomy    . Tubal ligation      History reviewed. No pertinent family history.  History  Substance Use Topics  . Smoking status: Current Every Day Smoker  . Smokeless tobacco: Never Used  . Alcohol Use: No    Allergies:  Allergies  Allergen Reactions  . Morphine And Related     Hives, and "it was really bad"    Prescriptions prior to admission  Medication Sig Dispense Refill Last Dose  . acetaminophen (TYLENOL) 500 MG tablet Take 1,000 mg by mouth every 8 (eight) hours as needed for moderate pain.   02/26/2014 at Unknown time  . ibuprofen (ADVIL,MOTRIN) 800 MG tablet Take 1 tablet (800 mg total) by mouth 3 (three) times daily. 21 tablet 0     ROS Physical Exam   Blood pressure 118/74, pulse 92, temperature 98.1 F (36.7 C), temperature source Oral, resp. rate 18, height 5\' 6"  (1.676 m), weight 106.051 kg (233 lb 12.8 oz), last menstrual period 04/04/2014.  Physical Exam  Nursing note and vitals reviewed. Constitutional: She is oriented to person, place, and time. She appears well-developed and well-nourished. No distress.  Cardiovascular: Normal rate.   Respiratory: Effort normal.  GI: Soft. There is no tenderness. There is no rebound.  Genitourinary:  cystocele and rectocele: seen with valsalva Uterus: NSSC, no prolapse with valsalva Kegal tone: minimal   Neurological: She is alert and oriented to person, place, and time.  Skin: Skin is warm and dry.  Psychiatric: She has a normal mood and affect.    MAU Course  Procedures  Results for orders placed or performed during the hospital encounter of 06/06/14 (from the past 24 hour(s))  Urinalysis, Routine w reflex microscopic     Status: Abnormal   Collection Time: 06/06/14  6:00 AM  Result Value Ref Range   Color, Urine YELLOW YELLOW   APPearance CLEAR CLEAR   Specific Gravity, Urine >1.030 (H) 1.005 - 1.030   pH 5.5 5.0 - 8.0   Glucose, UA NEGATIVE NEGATIVE mg/dL   Hgb urine dipstick NEGATIVE NEGATIVE   Bilirubin Urine NEGATIVE NEGATIVE   Ketones, ur NEGATIVE NEGATIVE mg/dL   Protein, ur NEGATIVE NEGATIVE mg/dL   Urobilinogen, UA 0.2 0.0 - 1.0 mg/dL   Nitrite NEGATIVE NEGATIVE   Leukocytes, UA NEGATIVE NEGATIVE  Pregnancy, urine POC     Status: None   Collection Time: 06/06/14  6:03 AM  Result Value Ref Range   Preg Test, Ur NEGATIVE NEGATIVE     Assessment and Plan   1. Cystocele with rectocele    Comfort measures Kegal exercises  Follow-up Information    Follow up with Alm BustardRN,HENRY H, MD.   Specialty:  Specialist   Why:  PLEASE CALL FOR REFERAL FOR PELVIC FLOOR PT  Contact information:   [address] [place] [id number] [phone]        Tawnya CrookHogan, Frankee Gritz Donovan 06/06/2014, 6:51 AM

## 2014-06-06 NOTE — Discharge Instructions (Signed)
Anterior prolapse (cystocele)  By [facility] Staff Anterior prolapse, also known as a cystocele (SIS-toe-seel), occurs when the supportive tissue between a woman's bladder and vaginal wall weakens and stretches, allowing the bladder to bulge into the vagina. Anterior prolapse is also called a prolapsed bladder.  Straining the muscles that support your pelvic organs may lead to anterior prolapse. Such straining occurs during vaginal childbirth or with chronic constipation, violent coughing or heavy lifting. Anterior prolapse also tends to cause problems after menopause, when estrogen levels decrease.  For a mild or moderate anterior prolapse, nonsurgical treatment is often effective. In more severe cases, surgery may be necessary to keep the vagina and other pelvic organs in their proper positions.  In mild cases of anterior prolapse, you may not notice any signs or symptoms. When signs and symptoms occur, they may include:  A feeling of fullness or pressure in your pelvis and vagina Increased discomfort when you strain, cough, bear down or lift A feeling that you haven't completely emptied your bladder after urinating Repeated bladder infections Pain or urinary leakage during sexual intercourse In severe cases, a bulge of tissue that protrudes through your vaginal opening and may feel like sitting on an egg Signs and symptoms often are especially noticeable after standing for long periods of time and may go away when you lie down.  When to see a doctor  A severely prolapsed bladder can be uncomfortable. It can make emptying your bladder difficult and may lead to bladder infections. Make an appointment with your doctor if you have any signs or symptoms that bother you.  Your pelvic floor consists of muscles, ligaments and connective tissues that support your bladder and other pelvic organs. The connections between your pelvic floor muscles and ligaments can weaken over time, as a result of  trauma from childbirth or chronic straining of pelvic floor muscles. When this happens, your bladder can slip down lower than normal and bulge into your vagina (anterior prolapse).  Possible causes of anterior prolapse include:  Pregnancy and vaginal childbirth Being overweight or obese Repeated heavy lifting Straining with bowel movements A chronic cough or bronchitis These factors may increase your risk of anterior prolapse:  Childbirth. Women who have vaginally delivered one or more children have a higher risk of anterior prolapse. Aging. Your risk of anterior prolapse increases as you age. This is especially true after menopause, when your body's production of estrogen -- which helps keep the pelvic floor strong -- decreases. Hysterectomy. Having your uterus removed may contribute to weakness in your pelvic floor support. Genetics. Some women are born with weaker connective tissues, making them more susceptible to anterior prolapse. Obesity. Women who are overweight or obese are at higher risk of anterior prolapse. Make an appointment with your family doctor or gynecologist if you have signs or symptoms of anterior prolapse that bother you or interfere with your normal activities.  Here's some information to help you prepare for your appointment and know what to expect from your doctor.  What you can do  Write down any symptoms you've had, and for how long. Make note of key medical information, including other conditions for which you're being treated and the names of medications, vitamins or supplements you regularly take. Bring a friend or relative along, if possible. Having someone else there may help you remember important information or provide details on something that you missed during the appointment. Write down questions to ask your doctor, listing the most important ones first in  case time runs short. For anterior prolapse, some basic questions to ask your doctor  include:  What is the most likely cause of my symptoms? Are there any other possible causes? Do I need any tests to confirm the diagnosis? What treatment approach do you recommend? If the first treatment doesn't work, what will you recommend next? Am I at risk of complications from this condition? What's the likelihood that the anterior prolapse will recur after treatment? Should I follow any activity restrictions? What can I do at home to ease my symptoms? Should I see a specialist? Besides the questions you prepare in advance, don't hesitate to ask other questions during your appointment if you need clarification.  What to expect from your doctor  During your appointment, your doctor may ask a number of questions, such as:  When did you first notice your symptoms? Do you have urine leakage (urinary incontinence)? Do you have frequent bladder infections? Do you have pain or leak urine during intercourse? Do you have a chronic or severe cough? Do you experience constipation and straining during bowel movements? What, if anything, seems to improve your symptoms? What, if anything, seems to worsen your symptoms? Does your mother or a sister have any pelvic floor problems? Have you delivered a baby vaginally? How many times? Do you wish to have children in the future? What else concerns you? Diagnosis of anterior prolapse may involve:  A pelvic exam. You may be examined while lying down and while standing up. During the exam, your doctor looks for a tissue bulge into your vagina that indicates pelvic organ prolapse. You'll likely be asked to bear down as if during a bowel movement to see how much that affects the degree of prolapse. To check the strength of your pelvic floor muscles, you'll be asked to contract them, as if you're trying to stop the stream of urine. Filling out a questionnaire. You may fill out a form that helps your doctor assess the degree of your prolapse and how much  it affects your quality of life. Information gathered also helps guide treatment decisions. Bladder and urine tests. If you have significant prolapse, you might be tested to see how well and completely your bladder empties. Your doctor might also run a test on a urine sample to look for signs of a bladder infection, if it seems that you're retaining more urine in your bladder than is normal after urinating. Treatment depends on how severe your anterior prolapse is and whether you have any related conditions, such as a uterus that slips into the vaginal canal (uterine prolapse).  Mild cases -- those with few or no obvious symptoms -- typically don't require treatment. You could opt for a wait-and-see approach, with occasional visits to your doctor to see if your prolapse is worsening, along with self-care measures, such as exercises that strengthen your pelvic floor muscles.  If self-care measures aren't effective, anterior prolapse treatment might involve:  A supportive device (pessary). A vaginal pessary is a plastic or rubber ring inserted into your vagina to support the bladder. Your doctor or other care provider fits you for the device and shows you how to clean and reinsert it on your own. Many women use pessaries as a temporary alternative to surgery, and some use them when surgery is too risky. Estrogen therapy. Your doctor may recommend using estrogen -- usually a vaginal cream, pill or ring -- especially if you've already experienced menopause. This is because estrogen, which helps keep pelvic  muscles strong, decreases after menopause. When surgery is necessary  If you have noticeable, uncomfortable symptoms, anterior prolapse may require surgery.  How it's done. Often, the surgery is performed vaginally and involves lifting the prolapsed bladder back into place, removing extra tissue, and tightening the muscles and ligaments of the pelvic floor. Your doctor may use a special type of tissue  graft to reinforce vaginal tissues and increase support if your vaginal tissues seem very thin. If you have a prolapsed uterus. For anterior prolapse associated with a prolapsed uterus, your doctor may recommend removing the uterus (hysterectomy) in addition to repairing the damaged pelvic floor muscles, ligaments and other tissues. If you're thinking about becoming pregnant, your doctor may recommend that you delay surgery until after you're done having children. Using a pessary may help relieve your symptoms in the meantime. The benefits of surgery can last for many years, but there's some risk of recurrence -- which may mean another surgery at some point.  Dealing with incontinence  If your anterior prolapse is accompanied by stress incontinence -- involuntary loss of urine during strenuous activity -- your doctor may recommend one of a number of procedures to support the urethra (urethral suspension) and ease your incontinence symptoms.  Kegel exercises strengthen your pelvic floor muscles, which support the uterus, bladder and bowel. A strengthened pelvic floor provides better support for your pelvic organs and relief from symptoms associated with anterior prolapse.  To perform Kegel exercises, follow these steps:  Tighten (contract) your pelvic floor muscles -- the muscles you use to stop urinating. Hold the contraction for five seconds, then relax for five seconds. (If this is too difficult, start by holding for two seconds and relaxing for three seconds.) Work up to holding the contraction for 10 seconds at a time. Do three sets of 10 repetitions of the exercises each day. Ask your health care provider for feedback on whether you're using the right muscles. Kegel exercises may be most successful when they're taught by a physical therapist and reinforced with biofeedback. Biofeedback involves using monitoring devices that help ensure you're tightening the proper muscles with optimal intensity  and length of time.  Once you've learned the proper method, you can do Kegel exercises discreetly just about anytime, whether you're sitting at your desk or relaxing on the couch.  To reduce your risk of developing anterior prolapse, try these self-care measures:  Perform Kegel exercises on a regular basis. These exercises can strengthen your pelvic floor muscles, and this is especially important after you have a baby. Treat and prevent constipation. High-fiber foods can help. Avoid heavy lifting, and lift correctly. When lifting, use your legs instead of your waist or back. Control coughing. Get treatment for a chronic cough or bronchitis, and don't smoke. Avoid weight gain. Talk to your doctor to determine your ideal weight and get advice on weight-loss strategies, if you need them.

## 2014-06-30 ENCOUNTER — Emergency Department (HOSPITAL_COMMUNITY)
Admission: EM | Admit: 2014-06-30 | Discharge: 2014-07-01 | Disposition: A | Discharge status: Home / Self Care | Payer: Medicaid Other | Attending: Emergency Medicine | Admitting: Emergency Medicine

## 2014-06-30 DIAGNOSIS — R102 Pelvic and perineal pain: Secondary | ICD-10-CM | POA: Insufficient documentation

## 2014-06-30 DIAGNOSIS — Z791 Long term (current) use of non-steroidal anti-inflammatories (NSAID): Secondary | ICD-10-CM | POA: Insufficient documentation

## 2014-06-30 DIAGNOSIS — R509 Fever, unspecified: Secondary | ICD-10-CM | POA: Diagnosis present

## 2014-06-30 DIAGNOSIS — N3090 Cystitis, unspecified without hematuria: Secondary | ICD-10-CM

## 2014-06-30 DIAGNOSIS — Z720 Tobacco use: Secondary | ICD-10-CM | POA: Insufficient documentation

## 2014-06-30 DIAGNOSIS — Z3202 Encounter for pregnancy test, result negative: Secondary | ICD-10-CM | POA: Diagnosis not present

## 2014-06-30 DIAGNOSIS — Z8659 Personal history of other mental and behavioral disorders: Secondary | ICD-10-CM | POA: Diagnosis not present

## 2014-06-30 DIAGNOSIS — Z8719 Personal history of other diseases of the digestive system: Secondary | ICD-10-CM | POA: Insufficient documentation

## 2014-06-30 DIAGNOSIS — R21 Rash and other nonspecific skin eruption: Secondary | ICD-10-CM | POA: Insufficient documentation

## 2014-06-30 DIAGNOSIS — Z8701 Personal history of pneumonia (recurrent): Secondary | ICD-10-CM | POA: Diagnosis not present

## 2014-06-30 DIAGNOSIS — G8929 Other chronic pain: Secondary | ICD-10-CM | POA: Insufficient documentation

## 2014-06-30 NOTE — ED Provider Notes (Signed)
CSN: [id number]     Arrival date & time 06/30/14  2308 History  This chart was scribed for non-physician practitioner, Jaynie Crumbleatyana Brightyn Mozer, PA-C, working with Olivia Mackielga M Otter, MD, by Bronson CurbJacqueline Melvin, ED Scribe. This patient was seen in room TR07C/TR07C and the patient's care was started at 11:53 PM.   Chief Complaint  Patient presents with  . Urinary Tract Infection   The history is provided by the patient. No language interpreter was used.    HPI Comments: Sara Bradshaw is a 23 y.o. female who presents to the Emergency Department complaining of UTI with onset PTA. Patient states she has a prolapsed uterus which causes her to have multiple UTIs. She reports whenever she is treated with ABX, she develops an allergic reactions. She states she is currently allergic to azithromycin and ciprofloxacin. She notes pain when laying flat. No current vaginal discharge, subjective low grade fever, redness to the face, dysuria, and urinary frequency. She states she has a pelvic exam 3 days ago, which revealed unremarkable findings other than her prolapsed uterus. She reports normal BMs. Patient denies vaginal bleeding. Taking tylenol with is helping     Past Medical History  Diagnosis Date  . Bipolar 1 disorder   . Back pain, chronic   . Pneumonia   . Constipation    Past Surgical History  Procedure Laterality Date  . Appendectomy    . Tubal ligation     No family history on file. History  Substance Use Topics  . Smoking status: Current Every Day Smoker  . Smokeless tobacco: Never Used  . Alcohol Use: No   OB History    Gravida Para Term Preterm AB TAB SAB Ectopic Multiple Living   4 3 2 1 1  1   3      Review of Systems  Constitutional: Positive for fever (subjective).  Genitourinary: Positive for dysuria, frequency and vaginal pain. Negative for vaginal bleeding, vaginal discharge and pelvic pain.  Skin: Positive for color change and rash.  All other systems reviewed and are  negative.     Allergies  Azithromycin; Ciprofloxacin; and Morphine and related  Home Medications   Prior to Admission medications   Medication Sig Start Date End Date Taking? Authorizing Provider  acetaminophen (TYLENOL) 500 MG tablet Take 1,000 mg by mouth every 8 (eight) hours as needed for moderate pain.    Historical Provider, MD  ibuprofen (ADVIL,MOTRIN) 800 MG tablet Take 1 tablet (800 mg total) by mouth 3 (three) times daily. 02/27/14   Hannah Muthersbaugh, PA-C   Triage Vitals: BP 134/76 mmHg  Pulse 89  Temp(Src) 98.1 F (36.7 C) (Oral)  Resp 20  SpO2 100%  LMP 04/04/2014 (Approximate)  Physical Exam  Constitutional: She is oriented to person, place, and time. She appears well-developed and well-nourished. No distress.  HENT:  Head: Normocephalic and atraumatic.  Eyes: Conjunctivae and EOM are normal.  Neck: Neck supple. No tracheal deviation present.  Cardiovascular: Normal rate, regular rhythm and normal heart sounds.   Pulmonary/Chest: Effort normal and breath sounds normal. No respiratory distress. She has no wheezes. She has no rales.  Abdominal: Soft. There is tenderness.  Suprapubic tenderness. No CVA tenderness  Musculoskeletal: Normal range of motion.  Neurological: She is alert and oriented to person, place, and time.  Skin: Skin is warm and dry.  Psychiatric: She has a normal mood and affect. Her behavior is normal.  Nursing note and vitals reviewed.   ED Course  Procedures (including critical care time)  DIAGNOSTIC STUDIES: Oxygen Saturation is 100% on room air, normal by my interpretation.    COORDINATION OF CARE: At 2357 Discussed treatment plan with patient which includes UA. Patient agrees.    Labs Review Labs Reviewed  URINALYSIS, ROUTINE W REFLEX MICROSCOPIC - Abnormal; Notable for the following:    APPearance CLOUDY (*)    Leukocytes, UA SMALL (*)    All other components within normal limits  URINE MICROSCOPIC-ADD ON - Abnormal; Notable  for the following:    Squamous Epithelial / LPF FEW (*)    All other components within normal limits  URINE CULTURE  PREGNANCY, URINE    Imaging Review No results found.   EKG Interpretation None      MDM   Final diagnoses:  Cystitis     patient with dysuria, urinary frequency, urgency. History of the same. States she has prolapsed uterus which is pressing on her bladder causing her to have frequent UTIs. Will check urinalysis. Patient states she just had a pelvic exam 3 days ago, states she was checked for STDs and was all negative. She wishes not to have another pelvic exam today.  12:41 AM Patient is UA showing small leukocyte, white blood cells, rare bacteria. Urine is cloudy. Doubt UTI, will send cultures. Patient's last antibiotic, stating that the Lodine helps. Will start on Keflex. Also will try some Ditropan to see if her symptoms would improve. Also recommending follow-up with OB/GYN questioning possibly uterus irritating her bladder causing her symptoms. Patient agreed with the plan.  Filed Vitals:   06/30/14 2324  BP: 134/76  Pulse: 89  Temp: 98.1 F (36.7 C)  TempSrc: Oral  Resp: 20  SpO2: 100%    I personally performed the services described in this documentation, which was scribed in my presence. The recorded information has been reviewed and is accurate.    Lottie Musselatyana A Kamuela Magos, PA-C 07/01/14 0103  Olivia Mackielga M Otter, MD 07/01/14 [phone]

## 2014-06-30 NOTE — ED Notes (Signed)
Presents with back pain and pain with urination. "I have a prolapsed uterus which is causing me to have UTIs and I have been placed on antibiotics numerous times, but now I have been developing allergies to the antibioitcs. When this UTI started one week ago I went to the doctor ans she told me that we were going to try and let my body fight it off, since I am allergic to most all antibiotics, but I feel worse from one ago"

## 2014-07-01 LAB — URINALYSIS, ROUTINE W REFLEX MICROSCOPIC
BILIRUBIN URINE: NEGATIVE
GLUCOSE, UA: NEGATIVE mg/dL
Hgb urine dipstick: NEGATIVE
Ketones, ur: NEGATIVE mg/dL
Nitrite: NEGATIVE
PH: 7 (ref 5.0–8.0)
Protein, ur: NEGATIVE mg/dL
SPECIFIC GRAVITY, URINE: 1.022 (ref 1.005–1.030)
Urobilinogen, UA: 1 mg/dL (ref 0.0–1.0)

## 2014-07-01 LAB — URINE MICROSCOPIC-ADD ON

## 2014-07-01 LAB — PREGNANCY, URINE: PREG TEST UR: NEGATIVE

## 2014-07-01 MED ORDER — CEPHALEXIN 500 MG PO CAPS: 500.0000 mg | ORAL_CAPSULE | Freq: Four times a day (QID) | ORAL | Status: DC

## 2014-07-01 MED ORDER — OXYBUTYNIN CHLORIDE 5 MG PO TABS: 5.0000 mg | ORAL_TABLET | Freq: Three times a day (TID) | ORAL | Status: DC | PRN

## 2014-07-01 MED ORDER — CEPHALEXIN 250 MG PO CAPS
500.0000 mg | ORAL_CAPSULE | Freq: Once | ORAL | Status: AC
  Administered 2014-07-01: 500 mg via ORAL

## 2014-07-01 NOTE — Discharge Instructions (Signed)
Take Keflex as prescribed until all gone for the infection. Take oxybutynin as prescribed for bladder spasms. Follow-up with your doctor. Return if symptoms are worsening or develop high fever, nausea, vomiting, increased pain.  Interstitial Cystitis Interstitial cystitis (IC) is a condition that results in discomfort or pain in the bladder and the surrounding pelvic region. The symptoms can be different from case to case and even in the same individual. People may experience:  Mild discomfort.  Pressure.  Tenderness.  Intense pain in the bladder and pelvic area. CAUSES  Because IC varies so much in symptoms and severity, people studying this disease believe it is not one but several diseases. Some caregivers use the term painful bladder syndrome (PBS) to describe cases with painful urinary symptoms. This may not meet the strictest definition of IC. The term IC / PBS includes all cases of urinary pain that cannot be connected to other causes, such as infection or urinary stones.  SYMPTOMS  Symptoms may include:  An urgent need to urinate.  A frequent need to urinate.  A combination of these symptoms. Pain may change in intensity as the bladder fills with urine or as it empties. Women's symptoms often get worse during menstruation. They may sometimes experience pain with vaginal intercourse. Some of the symptoms of IC / PBS seem like those of bacterial infection. Tests do not show infection. IC / PBS is far more common in women than in men.  DIAGNOSIS  The diagnosis of IC / PBS is based on:  Presence of pain related to the bladder, usually along with problems of frequency and urgency.  Not finding other diseases that could cause the symptoms.  Diagnostic tests that help rule out other diseases include:  Urinalysis.  Urine culture.  Cystoscopy.  Biopsy of the bladder wall.  Distension of the bladder under anesthesia.  Urine cytology.  Laboratory examination of prostate  secretions. A biopsy is a tissue sample that can be looked at under a microscope. Samples of the bladder and urethra may be removed during a cystoscopy. A biopsy helps rule out bladder cancer. TREATMENT  Scientists have not yet found a cure for IC / PBS. Patients with IC / PBS do not get better with antibiotic therapy. Caregivers cannot predict who will respond best to which treatment. Symptoms may disappear without explanation. Disappearing symptoms may coincide with an event such as a change in diet or treatment. Even when symptoms disappear, they may return after days, weeks, months, or years.  Because the causes of IC / PBS are unknown, current treatments are aimed at relieving symptoms. Many people are helped by one or a combination of the treatments. As researchers learn more about IC / PBS, the list of potential treatments will change. Patients should discuss their options with a caregiver. SURGERY  Surgery should be considered only if all available treatments have failed and the pain is disabling. Many approaches and techniques are used. Each approach has its own advantages and complications. Advantages and complications should be discussed with a urologist. Your caregiver may recommend consulting another urologist for a second opinion. Most caregivers are reluctant to operate because the outcome is unpredictable. Some people still have symptoms after surgery.  People considering surgery should discuss the potential risks and benefits, side effects, and long- and short-term complications with their family, as well as with people who have already had the procedure. Surgery requires anesthesia, hospitalization, and in some cases weeks or months of recovery. As the complexity of the procedure  increases, so do the chances for complications and for failure. HOME CARE INSTRUCTIONS   All drugs, even those sold over the counter, have side effects. Patients should always consult a caregiver before using  any drug for an extended amount of time. Only take over-the-counter or prescription medicines for pain, discomfort, or fever as directed by your caregiver.  Many patients feel that smoking makes their symptoms worse. How the by-products of tobacco that are excreted in the urine affect IC / PBS is unknown. Smoking is the major known cause of bladder cancer. One of the best things smokers can do for their bladder and their overall health is to quit.  Many patients feel that gentle stretching exercises help relieve IC / PBS symptoms.  Methods vary, but basically patients decide to empty their bladder at designated times and use relaxation techniques and distractions to keep to the schedule. Gradually, patients try to lengthen the time between scheduled voids. A diary in which to record voiding times is usually helpful in keeping track of progress. MAKE SURE YOU:   Understand these instructions.  Will watch your condition.  Will get help right away if you are not doing well or get worse. Document Released: 03/15/2004 Document Revised: 10/07/2011 Document Reviewed: 05/30/2008 [facility] [employer] Patient Information 2015 [employer] [place]. This information is not intended to replace advice given to you by your health care provider. Make sure you discuss any questions you have with your health care provider.

## 2014-07-02 LAB — URINE CULTURE

## 2014-07-15 ENCOUNTER — Encounter (HOSPITAL_COMMUNITY): Payer: Self-pay | Admitting: Emergency Medicine

## 2014-07-15 ENCOUNTER — Emergency Department (HOSPITAL_COMMUNITY): Payer: Medicaid Other

## 2014-07-15 ENCOUNTER — Emergency Department (HOSPITAL_COMMUNITY)
Admission: EM | Admit: 2014-07-15 | Discharge: 2014-07-16 | Discharge status: Against Medical Advice | Payer: Medicaid Other | Attending: Emergency Medicine | Admitting: Emergency Medicine

## 2014-07-15 DIAGNOSIS — R05 Cough: Secondary | ICD-10-CM

## 2014-07-15 DIAGNOSIS — Z8742 Personal history of other diseases of the female genital tract: Secondary | ICD-10-CM | POA: Insufficient documentation

## 2014-07-15 DIAGNOSIS — Z8659 Personal history of other mental and behavioral disorders: Secondary | ICD-10-CM | POA: Insufficient documentation

## 2014-07-15 DIAGNOSIS — Z8701 Personal history of pneumonia (recurrent): Secondary | ICD-10-CM | POA: Diagnosis not present

## 2014-07-15 DIAGNOSIS — Z720 Tobacco use: Secondary | ICD-10-CM | POA: Diagnosis not present

## 2014-07-15 DIAGNOSIS — R059 Cough, unspecified: Secondary | ICD-10-CM

## 2014-07-15 DIAGNOSIS — G8929 Other chronic pain: Secondary | ICD-10-CM | POA: Diagnosis not present

## 2014-07-15 DIAGNOSIS — R0602 Shortness of breath: Secondary | ICD-10-CM | POA: Insufficient documentation

## 2014-07-15 DIAGNOSIS — R079 Chest pain, unspecified: Secondary | ICD-10-CM | POA: Insufficient documentation

## 2014-07-15 DIAGNOSIS — Z8719 Personal history of other diseases of the digestive system: Secondary | ICD-10-CM | POA: Diagnosis not present

## 2014-07-15 HISTORY — DX: Uterovaginal prolapse, unspecified: N81.4

## 2014-07-15 MED ORDER — IBUPROFEN 800 MG PO TABS
800.0000 mg | ORAL_TABLET | Freq: Once | ORAL | Status: AC
  Administered 2014-07-16: 800 mg via ORAL
  Filled 2014-07-15: qty 1

## 2014-07-15 MED ORDER — PSEUDOEPHEDRINE HCL 60 MG PO TABS
60.0000 mg | ORAL_TABLET | Freq: Once | ORAL | Status: AC
  Administered 2014-07-16: 60 mg via ORAL
  Filled 2014-07-15: qty 1

## 2014-07-15 NOTE — ED Notes (Signed)
Pt. reports persistent productive cough with exertional dyspnea onset yesterday , denies fever or chills.

## 2014-07-15 NOTE — ED Notes (Signed)
Dr. Oni at bedside. 

## 2014-07-15 NOTE — ED Notes (Signed)
Pt c/o productive cough and sputum x 4 days. Hx of pneumonia. Reports coughing up "small beads of mucus." Denies fever or chills. Mild crackles noted to RLL

## 2014-07-15 NOTE — ED Provider Notes (Signed)
CSN: [id number]     Arrival date & time 07/15/14  2224 History  This chart was scribed for Tomasita CrumbleAdeleke Ameenah Prosser, MD by Karle PlumberJennifer Tensley, ED Scribe. This patient was seen in room B17C/B17C and the patient's care was started at 11:42 PM.  Chief Complaint  Patient presents with  . Cough  . Shortness of Breath   The history is provided by the patient. No language interpreter was used.    HPI Comments:  Jackquline BerlinKelsea Anne Powell is a 23 y.o. female who presents to the Emergency Department complaining of generalized body aches, chest pressure and cough that began yesterday. Pt reports the cough is productive of "bead-shaped" mucous and wakes her from her sleep. She reports sick contacts stating her children are sick with nasal congestion. She denies modifying factors. Has not taken anything to treat her symptoms. Denies recent surgery, h/o DVT, asthma, COPD or recent travel. Denies fever, chills, nausea, vomiting, diarrhea or diaphoresis. PMH of bipolar disorder and chronic back pain. Pt did not receive a flu vaccination. Endorses smoking.  Past Medical History  Diagnosis Date  . Bipolar 1 disorder   . Back pain, chronic   . Pneumonia   . Constipation   . Prolapsed uterus    Past Surgical History  Procedure Laterality Date  . Appendectomy    . Tubal ligation     No family history on file. History  Substance Use Topics  . Smoking status: Current Every Day Smoker  . Smokeless tobacco: Never Used  . Alcohol Use: No   OB History    Gravida Para Term Preterm AB TAB SAB Ectopic Multiple Living   4 3 2 1 1  1   3      Review of Systems  Constitutional: Negative for fever, chills and diaphoresis.  Respiratory: Positive for cough and shortness of breath.   Cardiovascular: Positive for chest pain.  Gastrointestinal: Negative for nausea, vomiting and diarrhea.  All other systems reviewed and are negative.   Allergies  Azithromycin; Ciprofloxacin; Morphine and related; and Prednisone  Home Medications    Prior to Admission medications   Medication Sig Start Date End Date Taking? Authorizing Provider  diphenhydrAMINE (BENADRYL) 25 mg capsule Take 25 mg by mouth every 6 (six) hours as needed for allergies.   Yes Historical Provider, MD  acetaminophen (TYLENOL) 500 MG tablet Take 1,000 mg by mouth every 8 (eight) hours as needed for moderate pain.    Historical Provider, MD  cephALEXin (KEFLEX) 500 MG capsule Take 1 capsule (500 mg total) by mouth 4 (four) times daily. Patient not taking: Reported on 07/15/2014 07/01/14   Tatyana A Kirichenko, PA-C  ibuprofen (ADVIL,MOTRIN) 800 MG tablet Take 1 tablet (800 mg total) by mouth 3 (three) times daily. Patient not taking: Reported on 07/15/2014 02/27/14   Dahlia ClientHannah Muthersbaugh, PA-C  oxybutynin (DITROPAN) 5 MG tablet Take 1 tablet (5 mg total) by mouth every 8 (eight) hours as needed for bladder spasms. Patient not taking: Reported on 07/15/2014 07/01/14   Lemont Fillersatyana A Kirichenko, PA-C   Triage Vitals: BP 126/76 mmHg  Pulse 100  Temp(Src) 98.2 F (36.8 C) (Oral)  Resp 23  Ht 5\' 6"  (1.676 m)  Wt 230 lb (104.327 kg)  BMI 37.14 kg/m2  SpO2 99%  LMP 07/10/2014 Physical Exam  Constitutional: She is oriented to person, place, and time. She appears well-developed and well-nourished. No distress.  HENT:  Head: Normocephalic and atraumatic.  Eyes: Conjunctivae and EOM are normal. Pupils are equal, round, and reactive to  light. No scleral icterus.  Neck: Normal range of motion. Neck supple. No JVD present. No tracheal deviation present. No thyromegaly present.  Cardiovascular: Normal rate, regular rhythm and normal heart sounds.  Exam reveals no gallop and no friction rub.   No murmur heard. Pulmonary/Chest: Effort normal and breath sounds normal. No respiratory distress. She has no wheezes. She exhibits no tenderness.  Abdominal: Soft. Bowel sounds are normal. She exhibits no distension and no mass. There is no tenderness. There is no rebound and no  guarding.  Musculoskeletal: Normal range of motion. She exhibits no edema or tenderness.  Lymphadenopathy:    She has no cervical adenopathy.  Neurological: She is alert and oriented to person, place, and time.  Skin: Skin is warm and dry. No rash noted. No erythema. No pallor.  Nursing note and vitals reviewed.   ED Course  Procedures (including critical care time) DIAGNOSTIC STUDIES: Oxygen Saturation is 99% on RA, normal by my interpretation.   COORDINATION OF CARE: 11:47 PM- Will prescribe pseudoephedrine and Ibuprofen. Pt verbalizes understanding and agrees to plan.  Medications - No data to display  Labs Review Labs Reviewed - No data to display  Imaging Review Dg Chest 2 View  07/15/2014   CLINICAL DATA:  Cough with chest pain and pressure beginning earlier today.  EXAM: CHEST  2 VIEW  COMPARISON:  07/01/2014.  FINDINGS: The heart size and mediastinal contours are within normal limits. Both lungs are clear. The visualized skeletal structures are unremarkable.  IMPRESSION: No active cardiopulmonary disease.  Stable exam.   Electronically Signed   By: Davonna BellingJohn  Curnes M.D.   On: 07/15/2014 23:22     EKG Interpretation None      MDM   Final diagnoses:  Cough    Patient presents emergency department out of concern for cough and body aches. Does are consistent with a viral syndrome. X-rays negative for pneumonia. She does have sick contacts and her children who have rhinorrhea. She was given Motrin and Sudafed in the emergency department.  Patient eloped from the ED prior to completing treatment or receveing DC instructions.  I personally performed the services described in this documentation, which was scribed in my presence. The recorded information has been reviewed and is accurate.    Tomasita CrumbleAdeleke Milaina Sher, MD 07/16/14 [phone]

## 2014-07-16 NOTE — ED Notes (Signed)
Unable to locate patient at this time. Pt not in room

## 2014-07-24 ENCOUNTER — Emergency Department (HOSPITAL_COMMUNITY)
Admission: EM | Admit: 2014-07-24 | Discharge: 2014-07-24 | Disposition: A | Discharge status: Home / Self Care | Payer: Medicaid Other | Attending: Emergency Medicine | Admitting: Emergency Medicine

## 2014-07-24 ENCOUNTER — Encounter (HOSPITAL_COMMUNITY): Payer: Self-pay | Admitting: Emergency Medicine

## 2014-07-24 DIAGNOSIS — Z792 Long term (current) use of antibiotics: Secondary | ICD-10-CM | POA: Insufficient documentation

## 2014-07-24 DIAGNOSIS — Z8701 Personal history of pneumonia (recurrent): Secondary | ICD-10-CM | POA: Diagnosis not present

## 2014-07-24 DIAGNOSIS — Z791 Long term (current) use of non-steroidal anti-inflammatories (NSAID): Secondary | ICD-10-CM | POA: Diagnosis not present

## 2014-07-24 DIAGNOSIS — E669 Obesity, unspecified: Secondary | ICD-10-CM | POA: Insufficient documentation

## 2014-07-24 DIAGNOSIS — Z720 Tobacco use: Secondary | ICD-10-CM | POA: Diagnosis not present

## 2014-07-24 DIAGNOSIS — G8929 Other chronic pain: Secondary | ICD-10-CM | POA: Insufficient documentation

## 2014-07-24 DIAGNOSIS — A5901 Trichomonal vulvovaginitis: Secondary | ICD-10-CM | POA: Insufficient documentation

## 2014-07-24 DIAGNOSIS — N814 Uterovaginal prolapse, unspecified: Secondary | ICD-10-CM | POA: Diagnosis not present

## 2014-07-24 DIAGNOSIS — Z8719 Personal history of other diseases of the digestive system: Secondary | ICD-10-CM | POA: Diagnosis not present

## 2014-07-24 DIAGNOSIS — A599 Trichomoniasis, unspecified: Secondary | ICD-10-CM

## 2014-07-24 DIAGNOSIS — Z3202 Encounter for pregnancy test, result negative: Secondary | ICD-10-CM | POA: Diagnosis not present

## 2014-07-24 DIAGNOSIS — N898 Other specified noninflammatory disorders of vagina: Secondary | ICD-10-CM | POA: Diagnosis present

## 2014-07-24 DIAGNOSIS — Z8659 Personal history of other mental and behavioral disorders: Secondary | ICD-10-CM | POA: Diagnosis not present

## 2014-07-24 DIAGNOSIS — Z8742 Personal history of other diseases of the female genital tract: Secondary | ICD-10-CM

## 2014-07-24 LAB — URINALYSIS, ROUTINE W REFLEX MICROSCOPIC
Bilirubin Urine: NEGATIVE
Glucose, UA: NEGATIVE mg/dL
HGB URINE DIPSTICK: NEGATIVE
KETONES UR: NEGATIVE mg/dL
Nitrite: NEGATIVE
PH: 5.5 (ref 5.0–8.0)
PROTEIN: NEGATIVE mg/dL
Specific Gravity, Urine: 1.023 (ref 1.005–1.030)
Urobilinogen, UA: 0.2 mg/dL (ref 0.0–1.0)

## 2014-07-24 LAB — WET PREP, GENITAL
CLUE CELLS WET PREP: NONE SEEN
Yeast Wet Prep HPF POC: NONE SEEN

## 2014-07-24 LAB — URINE MICROSCOPIC-ADD ON

## 2014-07-24 LAB — PREGNANCY, URINE: Preg Test, Ur: NEGATIVE

## 2014-07-24 MED ORDER — HYDROCODONE-ACETAMINOPHEN 5-325 MG PO TABS: 1.0000 | ORAL_TABLET | Freq: Four times a day (QID) | ORAL | Status: DC | PRN

## 2014-07-24 MED ORDER — METRONIDAZOLE 500 MG PO TABS: 500.0000 mg | ORAL_TABLET | Freq: Two times a day (BID) | ORAL | Status: DC

## 2014-07-24 MED ORDER — OXYCODONE-ACETAMINOPHEN 5-325 MG PO TABS
1.0000 | ORAL_TABLET | Freq: Once | ORAL | Status: AC
  Administered 2014-07-24: 1 via ORAL
  Filled 2014-07-24: qty 1

## 2014-07-24 NOTE — ED Notes (Signed)
Pelvic Cart at bedside 

## 2014-07-24 NOTE — Discharge Instructions (Signed)
Call your gynecologist for further evaluation of your history of uterine prolapse and treatment. Call for a follow up appointment with a Family or Primary Care Provider.  Return if Symptoms worsen.   Take medication as prescribed.   You have been treated in the emergency department for an infection, possibly sexually transmitted. Results of your gonorrhea and chlamydia tests are pending and you will be notified if they are positive. It is very important to practice safe sex and use condoms when sexually active. If your results are positive you need to notify all sexual partners so they can be treated as well. The website https://[email or web address]://www.dontspreadit.com/ can be used to send anonymous text messages or emails to alert sexual contacts. Follow up with your doctor, or OBGYN in regards to today's visit.    Gonorrhea and Chlamydia SYMPTOMS  In females, symptoms may go unnoticed. Symptoms that are more noticeable can include:  Belly (abdominal) pain.  Painful intercourse.  Watery mucous-like discharge from the vagina.  Miscarriage.  Discomfort when urinating.  Inflammation of the rectum.  Abnormal gray-green frothy vaginal discharge  Vaginal itching and irritatio  Itching and irritation of the area outside the vagina.   Painful urination.  Bleeding after sexual intercourse.  In males, symptoms include:  Burning with urination.  Pain in the testicles.  Watery mucous-like discharge from the penis.  It can cause longstanding (chronic) pelvic pain after frequent infections.  TREATMENT  PID can cause women to not be able to have children (sterile) if left untreated or if half-treated.  It is important to finish ALL medications given to you.  This is a sexually transmitted infection. So you are also at risk for other sexually transmitted diseases, including HIV (AIDS), it is recommended that you get tested. HOME CARE INSTRUCTIONS  Warning: This infection is contagious. Do not have sex until treatment is  completed. Follow up at your caregiver's office or the clinic to which you were referred. If your diagnosis (learning what is wrong) is confirmed by culture or some other method, your recent sexual contacts need treatment. Even if they are symptom free or have a negative culture or evaluation, they should be treated.  PREVENTION  Women should use sanitary pads instead of tampons for vaginal discharge.  Wipe front to back after using the toilet and avoid douching.   Practice safe sex, use condoms, have only one sex partner and be sure your sex partner is not having sex with others.  Ask your caregiver to test you for chlamydia at your regular checkups or sooner if you are having symptoms.  Ask for further information if you are pregnant.  SEEK IMMEDIATE MEDICAL CARE IF:  You develop an oral temperature above 102 F (38.9 C), not controlled by medications or lasting more than 2 days.  You develop an increase in pain.  You develop any type of abnormal discharge.  You develop vaginal bleeding and it is not time for your period.  You develop painful intercourse.   Bacterial Vaginosis  Bacterial vaginosis (BV) is a vaginal infection where the normal balance of bacteria in the vagina is disrupted. This is not a sexually transmitted disease and your sexual partners do NOT need to be treated. CAUSES  The cause of BV is not fully understood. BV develops when there is an increase or imbalance of harmful bacteria.  Some activities or behaviors can upset the normal balance of bacteria in the vagina and put women at increased risk including:  Having a new sex  partner or multiple sex partners.  Douching.  Using an intrauterine device (IUD) for contraception.  It is not clear what role sexual activity plays in the development of BV. However, women that have never had sexual intercourse are rarely infected with BV.  Women do not get BV from toilet seats, bedding, swimming pools or from touching objects around  them.   SYMPTOMS  Grey vaginal discharge.  A fish-like odor with discharge, especially after sexual intercourse.  Itching or burning of the vagina and vulva.  Burning or pain with urination.  Some women have no signs or symptoms at all.   TREATMENT  Sometimes BV will clear up without treatment.  BV may be treated with antibiotics.  BV can recur after treatment. If this happens, a second round of antibiotics will often be prescribed.  HOME CARE INSTRUCTIONS  Finish all medication as directed by your caregiver.  Do not have sex until treatment is completed.  Do NOT drink any alcoholic beverages while being treated  with Metronidazole (Flagyl). This will cause a severe reaction inducing vomiting.  RESOURCE GUIDE  Dental Problems  Patients with Medicaid: [facility]                     [facility] [phone]                                           [phone] Phone:  [phone]                                                  Phone:  [phone]  If unable to pay or uninsured, contact:  [facility] or [facility] to become qualified for the adult dental clinic.  Chronic Pain Problems Contact [facility]  [phone] Patients need to be referred by their primary care doctor.  Insufficient Money for Medicine Contact [employer]:  call "211" or [facility] [phone].  No Primary Care Doctor Call [facility]  [phone] Other agencies that provide inexpensive medical care    [facility]  [phone]    [facility]  [phone]    [facility]  [phone]    [facility]  [phone]    [facility]  [phone]    [facility]  [phone]  Psychological Services [facility]  [phone] [facility]  [phone] [facility]   [phone] (emergency services [phone])  Substance Abuse Resources [facility]   [phone] [facility] [phone] [facility] [phone] [facility] [phone] Residential & Outpatient Substance Abuse Program  [phone]  Abuse/Neglect [facility] Hotline [phone](336) [phone] [facility] Hotline 707 [phone] (After Hours)  Emergency Shelter [facility] [phone]) [phone]  [facility] [phone](336) [phone] [facility] [phone](704) [phone]  MRSA Hotline #:   747 418 2909409-007-2847    [facility]  [facility]     [employer]                          [facility]  [address] [place]                       [address]      [address]  [place]                                                [place]                            [place] Phone:  [phone]                                   Phone:  [phone]                 Phone:  [phone]  [facility] Phone:  [phone]  [facility] Child [facility] Hotline [phone](336) 6805300619 [phone](336) [phone] (After Hours)

## 2014-07-24 NOTE — ED Provider Notes (Signed)
CSN: [id number]     Arrival date & time 07/24/14  0301 History   First MD Initiated Contact with Patient 07/24/14 0601     Chief Complaint  Patient presents with  . Vaginal Discharge  . Vaginal Prolapse     (Consider location/radiation/quality/duration/timing/severity/associated sxs/prior Treatment) HPI Comments: The patient is a 23 year old female presents emergency room chief complaint of persistent uterine prolapse, abnormal vaginal discharge. Patient reports abnormal discharge for approximately one day. She reports "keeping" infection, despite multiple treatments of for Trichomonas. She denies new sexual partners, stating that her husband has been treated for Trichomonas as well.  She reports a follow-up appointment with a gynecologist in approximately in January. She reports several months of uterine prolapse. Symptoms worsen when sitting on the toilet, able to self reduce.  The history is provided by the patient. No language interpreter was used.    Past Medical History  Diagnosis Date  . Bipolar 1 disorder   . Back pain, chronic   . Pneumonia   . Constipation   . Prolapsed uterus    Past Surgical History  Procedure Laterality Date  . Appendectomy    . Tubal ligation     No family history on file. History  Substance Use Topics  . Smoking status: Current Every Day Smoker  . Smokeless tobacco: Never Used  . Alcohol Use: No   OB History    Gravida Para Term Preterm AB TAB SAB Ectopic Multiple Living   4 3 2 1 1  1   3      Review of Systems  Constitutional: Negative for fever and chills.  Genitourinary: Positive for vaginal discharge and pelvic pain. Negative for dysuria, hematuria, vaginal bleeding and genital sores.      Allergies  Azithromycin; Ciprofloxacin; Morphine and related; and Prednisone  Home Medications   Prior to Admission medications   Medication Sig Start Date End Date Taking? Authorizing Provider  acetaminophen (TYLENOL) 500 MG tablet Take  1,000 mg by mouth every 8 (eight) hours as needed for moderate pain.    Historical Provider, MD  cephALEXin (KEFLEX) 500 MG capsule Take 1 capsule (500 mg total) by mouth 4 (four) times daily. Patient not taking: Reported on 07/15/2014 07/01/14   Tatyana A Kirichenko, PA-C  diphenhydrAMINE (BENADRYL) 25 mg capsule Take 25 mg by mouth every 6 (six) hours as needed for allergies.    Historical Provider, MD  ibuprofen (ADVIL,MOTRIN) 800 MG tablet Take 1 tablet (800 mg total) by mouth 3 (three) times daily. Patient not taking: Reported on 07/15/2014 02/27/14   Dahlia ClientHannah Muthersbaugh, PA-C  oxybutynin (DITROPAN) 5 MG tablet Take 1 tablet (5 mg total) by mouth every 8 (eight) hours as needed for bladder spasms. Patient not taking: Reported on 07/15/2014 07/01/14   Tatyana A Kirichenko, PA-C   BP 116/62 mmHg  Pulse 83  Temp(Src) 98.1 F (36.7 C) (Oral)  Resp 14  SpO2 98%  LMP 07/10/2014 Physical Exam  Constitutional: She is oriented to person, place, and time. She appears well-developed and well-nourished. No distress.  Obese female  HENT:  Head: Normocephalic and atraumatic.  Neck: Neck supple.  Pulmonary/Chest: Effort normal. No respiratory distress.  Abdominal: Soft. There is tenderness in the suprapubic area. There is no rigidity and no guarding.  Mild suprapubic tenderness with palpation.  Genitourinary: Cervix exhibits motion tenderness. Right adnexum displays tenderness. Left adnexum displays tenderness.  Mild amount of yellow discharge in posterior vaginal vault. Chaperone present.   Neurological: She is alert and oriented to person,  place, and time.  Skin: Skin is warm and dry. She is not diaphoretic.  Psychiatric: She has a normal mood and affect. Her behavior is normal.  Nursing note and vitals reviewed.   ED Course  Procedures (including critical care time) Labs Review Labs Reviewed  WET PREP, GENITAL - Abnormal; Notable for the following:    Trich, Wet Prep FEW (*)    WBC, Wet  Prep HPF POC FEW (*)    All other components within normal limits  URINALYSIS, ROUTINE W REFLEX MICROSCOPIC - Abnormal; Notable for the following:    Leukocytes, UA SMALL (*)    All other components within normal limits  URINE MICROSCOPIC-ADD ON - Abnormal; Notable for the following:    Squamous Epithelial / LPF FEW (*)    Bacteria, UA MANY (*)    All other components within normal limits  GC/CHLAMYDIA PROBE AMP  PREGNANCY, URINE    Imaging Review No results found.   EKG Interpretation None      MDM   Final diagnoses:  Trichomonosis  History of uterine prolapse   Patient with history of uterine prolapse presents with abnormal discharge and recurrent prolapse. On exam patient is uterus is in normal position. Patient has mild CVA tenderness, positive Trichomonas. Discussed possible treatment for other STDs or PID, patient declines agrees to be treated for Trichomonas. And follow-up with her gynecologist who already evaluated her for similar complaints.     Mellody DrownLauren Ashely Goosby, PA-C 07/24/14 1601  Tilden FossaElizabeth Rees, MD 07/24/14 2249

## 2014-07-24 NOTE — ED Notes (Signed)
Pt. reports white vaginal discharge and prolapsed uterus onset last night , denies fall or injury.

## 2014-07-24 NOTE — ED Notes (Signed)
Reviewed pt discharge instructions and rx

## 2014-07-25 LAB — GC/CHLAMYDIA PROBE AMP
CT PROBE, AMP APTIMA: NEGATIVE
GC PROBE AMP APTIMA: NEGATIVE

## 2014-08-13 ENCOUNTER — Encounter (HOSPITAL_COMMUNITY): Payer: Self-pay | Admitting: Emergency Medicine

## 2014-08-13 ENCOUNTER — Emergency Department (HOSPITAL_COMMUNITY): Payer: Medicaid Other

## 2014-08-13 ENCOUNTER — Emergency Department (HOSPITAL_COMMUNITY)
Admission: EM | Admit: 2014-08-13 | Discharge: 2014-08-13 | Disposition: A | Discharge status: Home / Self Care | Payer: Medicaid Other | Attending: Emergency Medicine | Admitting: Emergency Medicine

## 2014-08-13 DIAGNOSIS — Z8701 Personal history of pneumonia (recurrent): Secondary | ICD-10-CM | POA: Insufficient documentation

## 2014-08-13 DIAGNOSIS — Z8742 Personal history of other diseases of the female genital tract: Secondary | ICD-10-CM | POA: Insufficient documentation

## 2014-08-13 DIAGNOSIS — W172XXA Fall into hole, initial encounter: Secondary | ICD-10-CM | POA: Diagnosis not present

## 2014-08-13 DIAGNOSIS — Y9389 Activity, other specified: Secondary | ICD-10-CM | POA: Diagnosis not present

## 2014-08-13 DIAGNOSIS — S99921A Unspecified injury of right foot, initial encounter: Secondary | ICD-10-CM | POA: Diagnosis present

## 2014-08-13 DIAGNOSIS — S93401A Sprain of unspecified ligament of right ankle, initial encounter: Secondary | ICD-10-CM | POA: Diagnosis not present

## 2014-08-13 DIAGNOSIS — Z8719 Personal history of other diseases of the digestive system: Secondary | ICD-10-CM | POA: Diagnosis not present

## 2014-08-13 DIAGNOSIS — Z720 Tobacco use: Secondary | ICD-10-CM | POA: Insufficient documentation

## 2014-08-13 DIAGNOSIS — X58XXXA Exposure to other specified factors, initial encounter: Secondary | ICD-10-CM | POA: Diagnosis not present

## 2014-08-13 DIAGNOSIS — Y998 Other external cause status: Secondary | ICD-10-CM | POA: Insufficient documentation

## 2014-08-13 DIAGNOSIS — Z8659 Personal history of other mental and behavioral disorders: Secondary | ICD-10-CM | POA: Diagnosis not present

## 2014-08-13 DIAGNOSIS — Y9289 Other specified places as the place of occurrence of the external cause: Secondary | ICD-10-CM | POA: Diagnosis not present

## 2014-08-13 DIAGNOSIS — G8929 Other chronic pain: Secondary | ICD-10-CM | POA: Diagnosis not present

## 2014-08-13 DIAGNOSIS — Z792 Long term (current) use of antibiotics: Secondary | ICD-10-CM | POA: Insufficient documentation

## 2014-08-13 MED ORDER — IBUPROFEN 600 MG PO TABS: 600.0000 mg | ORAL_TABLET | Freq: Four times a day (QID) | ORAL | Status: DC | PRN

## 2014-08-13 NOTE — ED Notes (Signed)
Pt reports while at the playground with her kids, pt stepped off a curb and her right foot twisted. Pt c/o pain to side of right foot.

## 2014-08-13 NOTE — Discharge Instructions (Signed)
Please read and follow all provided instructions.  Your diagnoses today include:  1. Ankle sprain, right, initial encounter     Tests performed today include:  An x-ray of your ankle - does NOT show any broken bones  Vital signs. See below for your results today.   Medications prescribed:   Ibuprofen (Motrin, Advil) - anti-inflammatory pain medication  Do not exceed 600mg  ibuprofen every 6 hours, take with food  You have been prescribed an anti-inflammatory medication or NSAID. Take with food. Take smallest effective dose for the shortest duration needed for your pain. Stop taking if you experience stomach pain or vomiting.   Take any prescribed medications only as directed.  Home care instructions:   Follow any educational materials contained in this packet  Follow R.I.C.E. Protocol:  R - rest your injury   I  - use ice on injury without applying directly to skin  C - compress injury with bandage or splint  E - elevate the injury as much as possible  Follow-up instructions: Please follow-up with your primary care provider or the provided orthopedic (bone specialist) if you continue to have significant pain or trouble walking in 1 week. In this case you may have a severe sprain that requires further care.   Return instructions:   Please return if your toes are numb or tingling, appear gray or blue, or you have severe pain (also elevate leg and loosen splint or wrap)  Please return to the Emergency Department if you experience worsening symptoms.   Please return if you have any other emergent concerns.  Additional Information:  Your vital signs today were: BP 122/92 mmHg   Pulse 86   Temp(Src) 98.4 F (36.9 C) (Oral)   Resp 16   Ht 5\' 6"  (1.676 m)   Wt 227 lb (102.967 kg)   BMI 36.66 kg/m2   SpO2 98%   LMP 07/10/2014 If your blood pressure (BP) was elevated above 135/85 this visit, please have this repeated by your doctor within one month. -------------- Your  caregiver has diagnosed you as suffering from an ankle sprain. Ankle sprain occurs when the ligaments that hold the ankle joint together are stretched or torn. It may take 4 to 6 weeks to heal.  For Activity: If prescribed crutches, use crutches with non-weight bearing for the first few days. Then, you may walk on your ankle as the pain allows, or as instructed. Start gradually with weight bearing on the affected ankle. Once you can walk pain free, then try jogging. When you can run forwards, then you can try moving side-to-side. If you cannot walk without crutches in one week, you need a re-check. --------------

## 2014-08-13 NOTE — ED Provider Notes (Signed)
CSN: [id number]     Arrival date & time 08/13/14  1709 History  This chart was scribed for non-physician practitioner, Rhea Bleacher working with Ethelda Chick, MD by Angelene Giovanni, ED Scribe. The patient was seen in room TR06C/TR06C and the patient's care was started at 6:24 PM    Chief Complaint  Patient presents with  . Foot Injury   The history is provided by the patient. No language interpreter was used.   HPI Comments: Sara Bradshaw is a 24 y.o. female who presents to the Emergency Department status post foot injury that occurred today PTA while playing with her kids on at the playground. She states that she stepped on a ground that had a hole in it which caused her to twist her foot and her body and fall. After the fall, she had difficulty walking to her car but was able to limp back. She reports associated right foot pain and difficulty walking. She reports that she put an ice pack on her foot and took Ibuprofen with slight relief.  Past Medical History  Diagnosis Date  . Bipolar 1 disorder   . Back pain, chronic   . Pneumonia   . Constipation   . Prolapsed uterus    Past Surgical History  Procedure Laterality Date  . Appendectomy    . Tubal ligation     No family history on file. History  Substance Use Topics  . Smoking status: Current Every Day Smoker  . Smokeless tobacco: Never Used  . Alcohol Use: No   OB History    Gravida Para Term Preterm AB TAB SAB Ectopic Multiple Living   Review of Systems  Constitutional: Positive for activity change. Negative for fever.  Musculoskeletal: Positive for arthralgias (right foot) and gait problem. Negative for back pain, joint swelling and neck pain.  Skin: Negative for wound.  Neurological: Negative for weakness and numbness.      Allergies  Azithromycin; Ciprofloxacin; Morphine and related; and Prednisone  Home Medications   Prior to Admission medications   Medication Sig Start Date End  Date Taking? Authorizing Provider  acetaminophen (TYLENOL) 500 MG tablet Take 1,000 mg by mouth every 8 (eight) hours as needed for moderate pain.    Historical Provider, MD  cephALEXin (KEFLEX) 500 MG capsule Take 1 capsule (500 mg total) by mouth 4 (four) times daily. Patient not taking: Reported on 07/15/2014 07/01/14   Tatyana A Kirichenko, PA-C  diphenhydrAMINE (BENADRYL) 25 mg capsule Take 25 mg by mouth every 6 (six) hours as needed for allergies.    Historical Provider, MD  HYDROcodone-acetaminophen (NORCO/VICODIN) 5-325 MG per tablet Take 1 tablet by mouth every 6 (six) hours as needed for moderate pain or severe pain. 07/24/14   Mellody Drown, PA-C  ibuprofen (ADVIL,MOTRIN) 800 MG tablet Take 1 tablet (800 mg total) by mouth 3 (three) times daily. Patient not taking: Reported on 07/15/2014 02/27/14   Dahlia Client Muthersbaugh, PA-C  metroNIDAZOLE (FLAGYL) 500 MG tablet Take 1 tablet (500 mg total) by mouth 2 (two) times daily. 07/24/14   Mellody Drown, PA-C  oxybutynin (DITROPAN) 5 MG tablet Take 1 tablet (5 mg total) by mouth every 8 (eight) hours as needed for bladder spasms. Patient not taking: Reported on 07/15/2014 07/01/14   Tatyana A Kirichenko, PA-C   BP 122/92 mmHg  Pulse 86  Temp(Src) 98.4 F (36.9 C) (Oral)  Resp 16  Ht  (1.676  m)  Wt 227 lb (102.967 kg)  BMI 36.66 kg/m2  SpO2 98%  LMP 07/10/2014 Physical Exam  Constitutional: She is oriented to person, place, and time. She appears well-developed and well-nourished. No distress.  HENT:  Head: Normocephalic and atraumatic.  Eyes: Conjunctivae and EOM are normal.  Neck: Normal range of motion. Neck supple. No tracheal deviation present.  Cardiovascular: Normal rate.   Pulses:      Dorsalis pedis pulses are 2+ on the right side, and 2+ on the left side.       Posterior tibial pulses are 2+ on the right side, and 2+ on the left side.  Pulmonary/Chest: Effort normal. No respiratory distress.  Musculoskeletal: Normal range  of motion. She exhibits edema and tenderness.  Patient complains of pain with palpation of the lateral right forefoot. There is slight swelling to this area. She denies pain with palpation over the fibular head of the affected side. She denies pain in the hip of the affected side.  Neurological: She is alert and oriented to person, place, and time.  Distal motor, sensation, and vascular intact.   Skin: Skin is warm and dry.  Psychiatric: She has a normal mood and affect. Her behavior is normal.  Nursing note and vitals reviewed.   ED Course  Procedures (including critical care time) DIAGNOSTIC STUDIES: Oxygen Saturation is 98% on RA, normal by my interpretation.    COORDINATION OF CARE: 6:29 PM- Pt advised of plan for treatment and pt agrees.    Labs Review Labs Reviewed - No data to display  Imaging Review Dg Ankle Complete Right  08/13/2014   CLINICAL DATA:  Initial encounter for stepped off curb playground. Twisted foot. Lateral pain.  EXAM: RIGHT ANKLE - COMPLETE 3+ VIEW  COMPARISON:  Foot films 10/1913  FINDINGS: No acute fracture or dislocation. Base of fifth metatarsal and talar dome intact.  IMPRESSION: No acute osseous abnormality.   Electronically Signed   By: Jeronimo GreavesKyle  Talbot M.D.   On: 08/13/2014 18:18   Dg Foot Complete Right  08/13/2014   CLINICAL DATA:  Twisting injury to the right foot when she stepped off of a curb earlier today, lateral pain. Initial encounter.  EXAM: RIGHT FOOT COMPLETE - 3+ VIEW  COMPARISON:  11/14/2013.  FINDINGS: No evidence of acute fracture or dislocation. Joint spaces well preserved. Well-preserved bone mineral density. Accessory ossicle or small sesamoid bone laterally at the IP joint of the great toe as noted previously. No significant intrinsic osseous abnormalities.  IMPRESSION: Normal examination.   Electronically Signed   By: Hulan Saashomas  Lawrence M.D.   On: 08/13/2014 18:20     EKG Interpretation None       6:32 PM Patient seen and examined.  Patient informed of x-ray results.  Vital signs reviewed and are as follows: BP 122/92 mmHg  Pulse 86  Temp(Src) 98.4 F (36.9 C) (Oral)  Resp 16  Ht 5\' 6"  (1.676 m)  Wt 227 lb (102.967 kg)  BMI 36.66 kg/m2  SpO2 98%  LMP 07/10/2014  Patient provided with ASO and crutches.  Patient was counseled on RICE protocol and told to rest injury, use ice for no longer than 15 minutes every hour, compress the area, and elevate above the level of their heart as much as possible to reduce swelling. Questions answered. Patient verbalized understanding.    Counseled on NSAID use. Counseled to follow up with orthopedist or PCP in one week if she still has continued pain or trouble walking.  MDM   Final diagnoses:  Ankle sprain, right, initial encounter   Patient with ankle/foot injury. Suspect sprain. X-rays are negative. Foot and ankle are neurovascularly intact. Conservative management with follow-up as described above if no improvement in one week.  I personally performed the services described in this documentation, which was scribed in my presence. The recorded information has been reviewed and is accurate.    [name], PA-C 08/13/14 1835  Ethelda Chick, MD 08/13/14 [phone]

## 2014-10-03 ENCOUNTER — Encounter (HOSPITAL_COMMUNITY): Payer: Self-pay | Admitting: Emergency Medicine

## 2014-10-03 ENCOUNTER — Emergency Department (HOSPITAL_COMMUNITY)
Admission: EM | Admit: 2014-10-03 | Discharge: 2014-10-03 | Disposition: A | Discharge status: Home / Self Care | Payer: Medicaid Other | Attending: Emergency Medicine | Admitting: Emergency Medicine

## 2014-10-03 ENCOUNTER — Emergency Department (HOSPITAL_COMMUNITY): Payer: Medicaid Other

## 2014-10-03 DIAGNOSIS — Z8659 Personal history of other mental and behavioral disorders: Secondary | ICD-10-CM | POA: Diagnosis not present

## 2014-10-03 DIAGNOSIS — Y9389 Activity, other specified: Secondary | ICD-10-CM | POA: Insufficient documentation

## 2014-10-03 DIAGNOSIS — Z8742 Personal history of other diseases of the female genital tract: Secondary | ICD-10-CM | POA: Insufficient documentation

## 2014-10-03 DIAGNOSIS — Z792 Long term (current) use of antibiotics: Secondary | ICD-10-CM | POA: Insufficient documentation

## 2014-10-03 DIAGNOSIS — S8991XA Unspecified injury of right lower leg, initial encounter: Secondary | ICD-10-CM | POA: Diagnosis present

## 2014-10-03 DIAGNOSIS — Y998 Other external cause status: Secondary | ICD-10-CM | POA: Diagnosis not present

## 2014-10-03 DIAGNOSIS — Z8701 Personal history of pneumonia (recurrent): Secondary | ICD-10-CM | POA: Diagnosis not present

## 2014-10-03 DIAGNOSIS — Z8719 Personal history of other diseases of the digestive system: Secondary | ICD-10-CM | POA: Diagnosis not present

## 2014-10-03 DIAGNOSIS — Y92019 Unspecified place in single-family (private) house as the place of occurrence of the external cause: Secondary | ICD-10-CM | POA: Diagnosis not present

## 2014-10-03 DIAGNOSIS — Z720 Tobacco use: Secondary | ICD-10-CM | POA: Insufficient documentation

## 2014-10-03 DIAGNOSIS — G8929 Other chronic pain: Secondary | ICD-10-CM | POA: Diagnosis not present

## 2014-10-03 DIAGNOSIS — W1839XA Other fall on same level, initial encounter: Secondary | ICD-10-CM | POA: Insufficient documentation

## 2014-10-03 NOTE — ED Notes (Signed)
Pt c/o right knee pain after falling onto knee yesterday; abrasion noted

## 2014-10-03 NOTE — ED Provider Notes (Signed)
CSN: [id number]     Arrival date & time 10/03/14  1449 History  This chart was scribed for non-physician practitioner, Kelle DartingJeffrey Todd Saverio Kader, PA-C, working with Eber HongBrian Miller MD by Charline BillsEssence Howell, ED Scribe. This patient was seen in room TR04C/TR04C and the patient's care was started at 3:26 PM.   Chief Complaint  Patient presents with  . Knee Pain   The history is provided by the patient. No language interpreter was used.   HPI Comments: Sara BerlinKelsea Anne Bradshaw is a 24 y.o. female, with a h/o chronic back pain and bipolar disorder, who presents to the Emergency Department complaining of R knee pain onset last night. Pt states that she was moving items from her house when her R ankle rolled and she landed on her R knee. Pt describes the quality of pain as a burning sensation at rest and a shooting sensation with ambulating. Pt denies numbness/tingling in lower extremities. She has been seen in the past by ortho for knee pain. Denies pain to the ankle or hip. She has not tried home therapy.   Past Medical History  Diagnosis Date  . Bipolar 1 disorder   . Back pain, chronic   . Pneumonia   . Constipation   . Prolapsed uterus    Past Surgical History  Procedure Laterality Date  . Appendectomy    . Tubal ligation     History reviewed. No pertinent family history. History  Substance Use Topics  . Smoking status: Current Every Day Smoker  . Smokeless tobacco: Never Used  . Alcohol Use: No   OB History    Gravida Para Term Preterm AB TAB SAB Ectopic Multiple Living   4 3 2 1 1  1   3      Review of Systems  Musculoskeletal: Positive for arthralgias.  All other systems reviewed and are negative.  Allergies  Azithromycin; Ciprofloxacin; Morphine and related; and Prednisone  Home Medications   Prior to Admission medications   Medication Sig Start Date End Date Taking? Authorizing Provider  acetaminophen (TYLENOL) 500 MG tablet Take 1,000 mg by mouth every 8 (eight) hours as needed for  moderate pain.    Historical Provider, MD  cephALEXin (KEFLEX) 500 MG capsule Take 1 capsule (500 mg total) by mouth 4 (four) times daily. Patient not taking: Reported on 07/15/2014 07/01/14   [name], PA-C  diphenhydrAMINE (BENADRYL) 25 mg capsule Take 25 mg by mouth every 6 (six) hours as needed for allergies.    Historical Provider, MD  HYDROcodone-acetaminophen (NORCO/VICODIN) 5-325 MG per tablet Take 1 tablet by mouth every 6 (six) hours as needed for moderate pain or severe pain. 07/24/14   Mellody DrownLauren Parker, PA-C  ibuprofen (ADVIL,MOTRIN) 600 MG tablet Take 1 tablet (600 mg total) by mouth every 6 (six) hours as needed. 08/13/14   Renne CriglerJoshua Geiple, PA-C  metroNIDAZOLE (FLAGYL) 500 MG tablet Take 1 tablet (500 mg total) by mouth 2 (two) times daily. 07/24/14   Mellody DrownLauren Parker, PA-C  oxybutynin (DITROPAN) 5 MG tablet Take 1 tablet (5 mg total) by mouth every 8 (eight) hours as needed for bladder spasms. Patient not taking: Reported on 07/15/2014 07/01/14   [name], PA-C   BP 124/82 mmHg  Pulse 107  Temp(Src) 98.6 F (37 C)  Resp 16  SpO2 99% Physical Exam  Constitutional: She is oriented to person, place, and time. She appears well-developed and well-nourished. No distress.  HENT:  Head: Normocephalic and atraumatic.  Eyes: Conjunctivae and EOM are normal. Pupils are  equal, round, and reactive to light. Right eye exhibits no discharge. Left eye exhibits no discharge. No scleral icterus.  Neck: Normal range of motion. Neck supple. No JVD present. No tracheal deviation present.  Cardiovascular: Normal rate and intact distal pulses.   Pulmonary/Chest: Effort normal. No stridor. No respiratory distress.  Musculoskeletal: Normal range of motion.   No pain in R thigh. Pain to palpation of the patellar. Pain to the tibial plateau. Full passive extension with pain to the anterior knee. Reduced active flexion due to pain. Inconclusive Lachman's due to pt guarding and due to pain.  Negative valgus/varus laxity.   Neurological: She is alert and oriented to person, place, and time. Coordination normal.  Skin: Skin is warm and dry.  Psychiatric: She has a normal mood and affect. Her behavior is normal. Judgment and thought content normal.  Nursing note and vitals reviewed.  ED Course  Procedures (including critical care time) DIAGNOSTIC STUDIES: Oxygen Saturation is 99% on RA, normal by my interpretation.    COORDINATION OF CARE: 3:32 PM-Discussed treatment plan which includes XR with pt at bedside and pt agreed to plan.   Labs Review Labs Reviewed - No data to display  Imaging Review Dg Knee Complete 4 Views Right  10/03/2014   CLINICAL DATA:  Anterior and lateral right knee pain post fall 2 days ago  EXAM: RIGHT KNEE - COMPLETE 4+ VIEW  COMPARISON:  02/27/2014  FINDINGS: There is no evidence of fracture, dislocation, or joint effusion. There is no evidence of arthropathy or other focal bone abnormality. Soft tissues are unremarkable.  IMPRESSION: Negative.   Electronically Signed   By: Natasha Mead M.D.   On: 10/03/2014 16:06     EKG Interpretation None      MDM   Final diagnoses:  None   Imaging: FINDINGS: There is no evidence of fracture, dislocation, or joint effusion. There is no evidence of arthropathy or other focal bone abnormality. Soft tissues are unremarkable.  Assessment: Knee pain secondary to fall unlikely boney, soft tissue, or ligamentous in nature. Pts physical exam was difficult due to guarding. Pt was given instructions for treatment plan but appeared agitated actively using her phone during conversation. She was instructed to use ice, elevation, and ibuprofen as needed for knee pain. Keep abrasion clean and dry. She was instructed to follow-up with PCP or orthopedist if pain continues or she experiences instability of the knee. Pt left without receiving discharge instruction papers.     I personally performed the services described in this  documentation, which was scribed in my presence. The recorded information has been reviewed and is accurate.    Eyvonne Mechanic, PA-C 10/03/14 1629  Eber Hong, MD 10/03/14 2035

## 2015-04-13 ENCOUNTER — Emergency Department (HOSPITAL_COMMUNITY): Payer: Medicaid Other

## 2015-04-13 ENCOUNTER — Emergency Department (HOSPITAL_COMMUNITY)
Admission: EM | Admit: 2015-04-13 | Discharge: 2015-04-13 | Disposition: A | Discharge status: Home / Self Care | Payer: Medicaid Other | Attending: Emergency Medicine | Admitting: Emergency Medicine

## 2015-04-13 ENCOUNTER — Encounter (HOSPITAL_COMMUNITY): Payer: Self-pay | Admitting: *Deleted

## 2015-04-13 DIAGNOSIS — Z8701 Personal history of pneumonia (recurrent): Secondary | ICD-10-CM | POA: Diagnosis not present

## 2015-04-13 DIAGNOSIS — Z8659 Personal history of other mental and behavioral disorders: Secondary | ICD-10-CM | POA: Insufficient documentation

## 2015-04-13 DIAGNOSIS — Z7982 Long term (current) use of aspirin: Secondary | ICD-10-CM | POA: Insufficient documentation

## 2015-04-13 DIAGNOSIS — Z79899 Other long term (current) drug therapy: Secondary | ICD-10-CM | POA: Insufficient documentation

## 2015-04-13 DIAGNOSIS — N739 Female pelvic inflammatory disease, unspecified: Secondary | ICD-10-CM | POA: Insufficient documentation

## 2015-04-13 DIAGNOSIS — R1030 Lower abdominal pain, unspecified: Secondary | ICD-10-CM | POA: Diagnosis present

## 2015-04-13 DIAGNOSIS — Z720 Tobacco use: Secondary | ICD-10-CM | POA: Insufficient documentation

## 2015-04-13 DIAGNOSIS — N730 Acute parametritis and pelvic cellulitis: Secondary | ICD-10-CM

## 2015-04-13 DIAGNOSIS — A5901 Trichomonal vulvovaginitis: Secondary | ICD-10-CM | POA: Diagnosis not present

## 2015-04-13 DIAGNOSIS — R102 Pelvic and perineal pain: Secondary | ICD-10-CM

## 2015-04-13 DIAGNOSIS — A599 Trichomoniasis, unspecified: Secondary | ICD-10-CM

## 2015-04-13 LAB — CBC
HEMATOCRIT: 40.1 % (ref 36.0–46.0)
HEMOGLOBIN: 13.3 g/dL (ref 12.0–15.0)
MCH: 30.1 pg (ref 26.0–34.0)
MCHC: 33.2 g/dL (ref 30.0–36.0)
MCV: 90.7 fL (ref 78.0–100.0)
PLATELETS: 203 10*3/uL (ref 150–400)
RBC: 4.42 MIL/uL (ref 3.87–5.11)
RDW: 13.7 % (ref 11.5–15.5)
WBC: 8.1 10*3/uL (ref 4.0–10.5)

## 2015-04-13 LAB — GC/CHLAMYDIA PROBE AMP (~~LOC~~) NOT AT ARMC
Chlamydia: NEGATIVE
Neisseria Gonorrhea: NEGATIVE

## 2015-04-13 LAB — URINE MICROSCOPIC-ADD ON

## 2015-04-13 LAB — COMPREHENSIVE METABOLIC PANEL
ALBUMIN: 3.7 g/dL (ref 3.5–5.0)
ALK PHOS: 55 U/L (ref 38–126)
ALT: 21 U/L (ref 14–54)
AST: 18 U/L (ref 15–41)
Anion gap: 9 (ref 5–15)
BUN: 8 mg/dL (ref 6–20)
CALCIUM: 9.3 mg/dL (ref 8.9–10.3)
CO2: 28 mmol/L (ref 22–32)
Chloride: 101 mmol/L (ref 101–111)
Creatinine, Ser: 0.95 mg/dL (ref 0.44–1.00)
GFR calc Af Amer: >60 mL/min (ref >60)
GFR calc non Af Amer: >60 mL/min (ref >60)
GLUCOSE: 99 mg/dL (ref 65–99)
POTASSIUM: 3.9 mmol/L (ref 3.5–5.1)
SODIUM: 138 mmol/L (ref 135–145)
Total Bilirubin: 0.4 mg/dL (ref 0.3–1.2)
Total Protein: 6.5 g/dL (ref 6.5–8.1)

## 2015-04-13 LAB — WET PREP, GENITAL: Yeast Wet Prep HPF POC: NONE SEEN

## 2015-04-13 LAB — URINALYSIS, ROUTINE W REFLEX MICROSCOPIC
Bilirubin Urine: NEGATIVE
Glucose, UA: NEGATIVE mg/dL
Hgb urine dipstick: NEGATIVE
KETONES UR: NEGATIVE mg/dL
NITRITE: NEGATIVE
PROTEIN: NEGATIVE mg/dL
Specific Gravity, Urine: 1.027 (ref 1.005–1.030)
Urobilinogen, UA: 0.2 mg/dL (ref 0.0–1.0)
pH: 5.5 (ref 5.0–8.0)

## 2015-04-13 MED ORDER — IBUPROFEN 600 MG PO TABS: 600.0000 mg | ORAL_TABLET | Freq: Four times a day (QID) | ORAL | Status: DC | PRN

## 2015-04-13 MED ORDER — LIDOCAINE HCL (PF) 1 % IJ SOLN
5.0000 mL | Freq: Once | INTRAMUSCULAR | Status: AC
  Administered 2015-04-13: 5 mL
  Filled 2015-04-13: qty 5

## 2015-04-13 MED ORDER — METRONIDAZOLE 500 MG PO TABS: 500.0000 mg | ORAL_TABLET | Freq: Two times a day (BID) | ORAL | Status: DC

## 2015-04-13 MED ORDER — ONDANSETRON 8 MG PO TBDP: 8.0000 mg | ORAL_TABLET | Freq: Three times a day (TID) | ORAL | Status: DC | PRN

## 2015-04-13 MED ORDER — DOXYCYCLINE HYCLATE 100 MG PO CAPS: 100.0000 mg | ORAL_CAPSULE | Freq: Two times a day (BID) | ORAL | Status: DC

## 2015-04-13 MED ORDER — CEFTRIAXONE SODIUM 250 MG IJ SOLR
250.0000 mg | Freq: Once | INTRAMUSCULAR | Status: AC
  Administered 2015-04-13: 250 mg via INTRAMUSCULAR
  Filled 2015-04-13: qty 250

## 2015-04-13 MED ORDER — OXYCODONE-ACETAMINOPHEN 5-325 MG PO TABS
ORAL_TABLET | ORAL | Status: AC
  Filled 2015-04-13: qty 1

## 2015-04-13 MED ORDER — OXYCODONE-ACETAMINOPHEN 5-325 MG PO TABS
1.0000 | ORAL_TABLET | Freq: Once | ORAL | Status: AC
  Administered 2015-04-13: 1 via ORAL

## 2015-04-13 NOTE — ED Notes (Signed)
Pelvic cart set up at bedside  

## 2015-04-13 NOTE — ED Notes (Signed)
Patient presents with c/o lower abd pain.  Denies urinary symptoms and no vag discharge

## 2015-04-13 NOTE — ED Notes (Signed)
EDP at bedside  

## 2015-04-13 NOTE — ED Notes (Signed)
Patient voided and stated now she has a burning sensation and her right lower abd is hurting.

## 2015-04-13 NOTE — ED Provider Notes (Signed)
CSN: [id number]     Arrival date & time 04/13/15  0207 History  This chart was scribed for Derwood Kaplan, MD by Doreatha Martin, ED Scribe. This patient was seen in room A01C/A01C and the patient's care was started at 3:53 AM.     Chief Complaint  Patient presents with  . Abdominal Pain   The history is provided by the patient. No language interpreter was used.    HPI Comments: Sara Bradshaw is a 24 y.o. female with hx of prolapsed bladder, s/p hysterectomy who presents to the Emergency Department complaining of moderate lower abdominal pain onset last night at 2300 and worsened this morning at 2430 with associated dysuria, left lower back pain. Pt notes that abdominal pain is exacerbated by urination. Hx of appendectomy. She denies vaginal discharge, vaginal bleeding.   Past Medical History  Diagnosis Date  . Bipolar 1 disorder   . Back pain, chronic   . Pneumonia   . Constipation   . Prolapsed uterus    Past Surgical History  Procedure Laterality Date  . Appendectomy    . Tubal ligation     No family history on file. Social History  Substance Use Topics  . Smoking status: Current Every Day Smoker  . Smokeless tobacco: Never Used  . Alcohol Use: No   OB History    Gravida Para Term Preterm AB TAB SAB Ectopic Multiple Living   4 3 2 1 1  1   3      Review of Systems  Gastrointestinal: Positive for abdominal pain.  Genitourinary: Positive for dysuria. Negative for vaginal bleeding and vaginal discharge.  Musculoskeletal: Positive for back pain.  A complete 10 system review of systems was obtained and all systems are negative except as noted in the HPI and PMH.   Allergies  Azithromycin; Ciprofloxacin; Morphine and related; and Prednisone  Home Medications   Prior to Admission medications   Medication Sig Start Date End Date Taking? Authorizing Provider  aspirin 81 MG chewable tablet Chew 81 mg by mouth daily.   Yes Historical Provider, MD  ferrous sulfate 325 (65 FE)  MG tablet Take 325 mg by mouth daily with breakfast.   Yes Historical Provider, MD  Multiple Vitamin (MULTIVITAMIN WITH MINERALS) TABS tablet Take 1 tablet by mouth daily.   Yes Historical Provider, MD  doxycycline (VIBRAMYCIN) 100 MG capsule Take 1 capsule (100 mg total) by mouth 2 (two) times daily. 04/13/15   Derwood Kaplan, MD  HYDROcodone-acetaminophen (NORCO/VICODIN) 5-325 MG per tablet Take 1 tablet by mouth every 6 (six) hours as needed for moderate pain or severe pain. Patient not taking: Reported on 04/13/2015 07/24/14   Mellody Drown, PA-C  ibuprofen (ADVIL,MOTRIN) 600 MG tablet Take 1 tablet (600 mg total) by mouth every 6 (six) hours as needed. 04/13/15   Derwood Kaplan, MD  metroNIDAZOLE (FLAGYL) 500 MG tablet Take 1 tablet (500 mg total) by mouth 2 (two) times daily. 04/13/15   Derwood Kaplan, MD  ondansetron (ZOFRAN ODT) 8 MG disintegrating tablet Take 1 tablet (8 mg total) by mouth every 8 (eight) hours as needed for nausea. 04/13/15   Derwood Kaplan, MD  oxybutynin (DITROPAN) 5 MG tablet Take 1 tablet (5 mg total) by mouth every 8 (eight) hours as needed for bladder spasms. Patient not taking: Reported on 07/15/2014 07/01/14   [name], PA-C   BP 98/62 mmHg  Pulse 70  Temp(Src) 98.5 F (36.9 C) (Oral)  Resp 18  SpO2 97%  LMP 07/10/2014 Physical  Exam  Constitutional: She is oriented to person, place, and time. She appears well-developed and well-nourished.  HENT:  Head: Normocephalic and atraumatic.  Eyes: Conjunctivae and EOM are normal. Pupils are equal, round, and reactive to light.  Neck: Normal range of motion. Neck supple.  Cardiovascular: Normal rate, regular rhythm, normal heart sounds and intact distal pulses.   No murmur heard. Pulmonary/Chest: Effort normal and breath sounds normal. No respiratory distress. She has no wheezes.  Lungs CTA bilaterally.   Abdominal: Soft. Bowel sounds are normal. She exhibits no distension. There is tenderness. There is no  rebound and no guarding.  + bowel sounds. Pt has lower quadrant tenderness, worse in suprapubic region. Positive for left flank tenderness.   Genitourinary: Vagina normal and uterus normal.  External exam - normal, no lesions Speculum exam: Pt has some white discharge, no blood Bimanual exam: Patient has no CMT. No uterus.  Musculoskeletal: Normal range of motion.  Neurological: She is alert and oriented to person, place, and time.  Skin: Skin is warm and dry.  Psychiatric: She has a normal mood and affect. Her behavior is normal.  Nursing note and vitals reviewed.  ED Course  Procedures (including critical care time) DIAGNOSTIC STUDIES: Oxygen Saturation is 98% on RA, normal by my interpretation.    COORDINATION OF CARE: 3:56 AM Discussed treatment plan with pt at bedside and pt agreed to plan.   Labs Review Labs Reviewed  WET PREP, GENITAL - Abnormal; Notable for the following:    Trich, Wet Prep FEW (*)    Clue Cells Wet Prep HPF POC MANY (*)    WBC, Wet Prep HPF POC FEW (*)    All other components within normal limits  URINALYSIS, ROUTINE W REFLEX MICROSCOPIC (NOT AT [facility]) - Abnormal; Notable for the following:    APPearance CLOUDY (*)    Leukocytes, UA MODERATE (*)    All other components within normal limits  URINE MICROSCOPIC-ADD ON - Abnormal; Notable for the following:    Squamous Epithelial / LPF FEW (*)    All other components within normal limits  COMPREHENSIVE METABOLIC PANEL  CBC  GC/CHLAMYDIA PROBE AMP ([place]) NOT AT [facility]    Imaging Review US Transvaginal Non-ob  04/13/2015   CLINICAL DATA:  Pelvic pain for 10 hours, history of hysterectomy, concern for torsion  EXAM: TRANSABDOMINAL AND TRANSVAGINAL ULTRASOUND OF PELVIS  DOPPLER ULTRASOUND OF OVARIES  TECHNIQUE: Both transabdominal and transvaginal ultrasound examinations of the pelvis were performed. Transabdominal technique was performed for global imaging of the pelvis including uterus, ovaries,  adnexal regions, and pelvic cul-de-sac.  It was necessary to proceed with endovaginal exam following the transabdominal exam to visualize the ovaries. Color and duplex Doppler ultrasound was utilized to evaluate blood flow to the ovaries.  COMPARISON:  None.  FINDINGS: Uterus  Surgically absent  Right ovary  Measurements: 33 x 18 x 19 mm. Normal appearance/no adnexal mass.  Left ovary  Measurements: 37 x 25 x 28 mm. Normal appearance/no adnexal mass.  Pulsed Doppler evaluation of both ovaries demonstrates normal low-resistance arterial and venous waveforms.  Other findings  No free fluid.  IMPRESSION: Normal pelvic ultrasound in patient who status post hysterectomy. No evidence of torsion.   Electronically Signed   By: Esperanza Heir M.D.   On: 04/13/2015 07:35   US Pelvis Complete  04/13/2015   CLINICAL DATA:  Pelvic pain for 10 hours, history of hysterectomy, concern for torsion  EXAM: TRANSABDOMINAL AND TRANSVAGINAL ULTRASOUND OF PELVIS  DOPPLER ULTRASOUND OF OVARIES  TECHNIQUE: Both transabdominal and transvaginal ultrasound examinations of the pelvis were performed. Transabdominal technique was performed for global imaging of the pelvis including uterus, ovaries, adnexal regions, and pelvic cul-de-sac.  It was necessary to proceed with endovaginal exam following the transabdominal exam to visualize the ovaries. Color and duplex Doppler ultrasound was utilized to evaluate blood flow to the ovaries.  COMPARISON:  None.  FINDINGS: Uterus  Surgically absent  Right ovary  Measurements: 33 x 18 x 19 mm. Normal appearance/no adnexal mass.  Left ovary  Measurements: 37 x 25 x 28 mm. Normal appearance/no adnexal mass.  Pulsed Doppler evaluation of both ovaries demonstrates normal low-resistance arterial and venous waveforms.  Other findings  No free fluid.  IMPRESSION: Normal pelvic ultrasound in patient who status post hysterectomy. No evidence of torsion.   Electronically Signed   By: Esperanza Heir M.D.   On:  04/13/2015 07:35   [place] Art/ven Flow Abd Pelv Doppler  04/13/2015   CLINICAL DATA:  Pelvic pain for 10 hours, history of hysterectomy, concern for torsion  EXAM: TRANSABDOMINAL AND TRANSVAGINAL ULTRASOUND OF PELVIS  DOPPLER ULTRASOUND OF OVARIES  TECHNIQUE: Both transabdominal and transvaginal ultrasound examinations of the pelvis were performed. Transabdominal technique was performed for global imaging of the pelvis including uterus, ovaries, adnexal regions, and pelvic cul-de-sac.  It was necessary to proceed with endovaginal exam following the transabdominal exam to visualize the ovaries. Color and duplex Doppler ultrasound was utilized to evaluate blood flow to the ovaries.  COMPARISON:  None.  FINDINGS: Uterus  Surgically absent  Right ovary  Measurements: 33 x 18 x 19 mm. Normal appearance/no adnexal mass.  Left ovary  Measurements: 37 x 25 x 28 mm. Normal appearance/no adnexal mass.  Pulsed Doppler evaluation of both ovaries demonstrates normal low-resistance arterial and venous waveforms.  Other findings  No free fluid.  IMPRESSION: Normal pelvic ultrasound in patient who status post hysterectomy. No evidence of torsion.   Electronically Signed   By: Esperanza Heir M.D.   On: 04/13/2015 07:35   I have personally reviewed and evaluated these images and lab results as part of my medical decision-making.   EKG Interpretation None      MDM   Final diagnoses:  Acute PID (pelvic inflammatory disease)  Trichomonas infection    I personally performed the services described in this documentation, which was scribed in my presence. The recorded information has been reviewed and is accurate.  Sudden onset severe abd pain, lower quadrants. She is s/p hysterectomy, still has her ovaries. The abd exam is not focal for appendicitis. Pt has guarding over the suprapubic and LLQ region. She also reports significant pain with urination. Hysterectomy due to prolapsed uterus.  Plan: US pelvis, r/o  torsion for sudden onset pain. UA, CBC.  If [place] is neg, will reassess. With normal WC and relatively normal appearing urine, i dont think CT is needed unless her pain is severe.  Derwood Kaplan, MD 04/13/15 [phone]

## 2015-04-13 NOTE — Discharge Instructions (Signed)
Our results in the ER show that you have a Trichomonas infection, STD. We treated you for gonorrhea infection while you were in the ER. YOU MUST COMPLETE THE ANTIBIOTIC COURSE TO ENSURE THAT YOU ARE COVERED FOR PID AND TRICHOMONAS. Rest of the results show: There is no evidence of Urinary tract infection and the pregnancy test is negative as well.  PLEASE PRACTICE SAFE SEX. PLEASE INFORM YOUR PARTNER TO GET CHECKED AND TREATED FOR STD AS WELL.   Sexually Transmitted Disease A sexually transmitted disease (STD) is a disease or infection that may be passed (transmitted) from person to person, usually during sexual activity. This may happen by way of saliva, semen, blood, vaginal mucus, or urine. Common STDs include:   Gonorrhea.   Chlamydia.   Syphilis.   HIV and AIDS.   Genital herpes.   Hepatitis B and C.   Trichomonas.   Human papillomavirus (HPV).   Pubic lice.   Scabies.  Mites.  Bacterial vaginosis. WHAT ARE CAUSES OF STDs? An STD may be caused by bacteria, a virus, or parasites. STDs are often transmitted during sexual activity if one person is infected. However, they may also be transmitted through nonsexual means. STDs may be transmitted after:   Sexual intercourse with an infected person.   Sharing sex toys with an infected person.   Sharing needles with an infected person or using unclean piercing or tattoo needles.  Having intimate contact with the genitals, mouth, or rectal areas of an infected person.   Exposure to infected fluids during birth. WHAT ARE THE SIGNS AND SYMPTOMS OF STDs? Different STDs have different symptoms. Some people may not have any symptoms. If symptoms are present, they may include:   Painful or bloody urination.   Pain in the pelvis, abdomen, vagina, anus, throat, or eyes.   A skin rash, itching, or irritation.  Growths, ulcerations, blisters, or sores in the genital and anal areas.  Abnormal vaginal discharge  with or without bad odor.   Penile discharge in men.   Fever.   Pain or bleeding during sexual intercourse.   Swollen glands in the groin area.   Yellow skin and eyes (jaundice). This is seen with hepatitis.   Swollen testicles.  Infertility.  Sores and blisters in the mouth. HOW ARE STDs DIAGNOSED? To make a diagnosis, your health care provider may:   Take a medical history.   Perform a physical exam.   Take a sample of any discharge to examine.  Swab the throat, cervix, opening to the penis, rectum, or vagina for testing.  Test a sample of your first morning urine.   Perform blood tests.   Perform a Pap test, if this applies.   Perform a colposcopy.   Perform a laparoscopy.  HOW ARE STDs TREATED? Treatment depends on the STD. Some STDs may be treated but not cured.   Chlamydia, gonorrhea, trichomonas, and syphilis can be cured with antibiotic medicine.   Genital herpes, hepatitis, and HIV can be treated, but not cured, with prescribed medicines. The medicines lessen symptoms.   Genital warts from HPV can be treated with medicine or by freezing, burning (electrocautery), or surgery. Warts may come back.   HPV cannot be cured with medicine or surgery. However, abnormal areas may be removed from the cervix, vagina, or vulva.   If your diagnosis is confirmed, your recent sexual partners need treatment. This is true even if they are symptom-free or have a negative culture or evaluation. They should not have sex  until their health care providers say it is okay. HOW CAN I REDUCE MY RISK OF GETTING AN STD? Take these steps to reduce your risk of getting an STD:  Use latex condoms, dental dams, and water-soluble lubricants during sexual activity. Do not use petroleum jelly or oils.  Avoid having multiple sex partners.  Do not have sex with someone who has other sex partners.  Do not have sex with anyone you do not know or who is at high risk for an  STD.  Avoid risky sex practices that can break your skin.  Do not have sex if you have open sores on your mouth or skin.  Avoid drinking too much alcohol or taking illegal drugs. Alcohol and drugs can affect your judgment and put you in a vulnerable position.  Avoid engaging in oral and anal sex acts.  Get vaccinated for HPV and hepatitis. If you have not received these vaccines in the past, talk to your health care provider about whether one or both might be right for you.   If you are at risk of being infected with HIV, it is recommended that you take a prescription medicine daily to prevent HIV infection. This is called pre-exposure prophylaxis (PrEP). You are considered at risk if:  You are a man who has sex with other men (MSM).  You are a heterosexual man or woman and are sexually active with more than one partner.  You take drugs by injection.  You are sexually active with a partner who has HIV.  Talk with your health care provider about whether you are at high risk of being infected with HIV. If you choose to begin PrEP, you should first be tested for HIV. You should then be tested every 3 months for as long as you are taking PrEP.  WHAT SHOULD I DO IF I THINK I HAVE AN STD?  See your health care provider.   Tell your sexual partner(s). They should be tested and treated for any STDs.  Do not have sex until your health care provider says it is okay. WHEN SHOULD I GET IMMEDIATE MEDICAL CARE? Contact your health care provider right away if:   You have severe abdominal pain.  You are a man and notice swelling or pain in your testicles.  You are a woman and notice swelling or pain in your vagina. Document Released: 10/05/2002 Document Revised: 07/20/2013 Document Reviewed: 02/02/2013 [facility] Patient Information 2015 [place], [place]. This information is not intended to replace advice given to you by your health care provider. Make sure you discuss any questions you have  with your health care provider. Pelvic Inflammatory Disease Pelvic inflammatory disease (PID) refers to an infection in some or all of the female organs. The infection can be in the uterus, ovaries, fallopian tubes, or the surrounding tissues in the pelvis. PID can cause abdominal or pelvic pain that comes on suddenly (acute pelvic pain). PID is a serious infection because it can lead to lasting (chronic) pelvic pain or the inability to have children (infertile).  CAUSES  The infection is often caused by the normal bacteria found in the vaginal tissues. PID may also be caused by an infection that is spread during sexual contact. PID can also occur following:   The birth of a baby.   A miscarriage.   An abortion.   Major pelvic surgery.   The use of an intrauterine device (IUD).   A sexual assault.  RISK FACTORS Certain factors can put a  person at higher risk for PID, such as:  Being younger than 25 years.  Being sexually active at [place] age.  Usingnonbarrier contraception.  Havingmultiple sexual partners.  Having sex with someone who has symptoms of a genital infection.  Using oral contraception. Other times, certain behaviors can increase the possibility of getting PID, such as:  Having sex during your period.  Using a vaginal douche.  Having an intrauterine device (IUD) in place. SYMPTOMS   Abdominal or pelvic pain.   Fever.   Chills.   Abnormal vaginal discharge.  Abnormal uterine bleeding.   Unusual pain shortly after finishing your period. DIAGNOSIS  Your caregiver will choose some of the following methods to make a diagnosis, such as:   Performinga physical exam and history. A pelvic exam typically reveals a very tender uterus and surrounding pelvis.   Ordering laboratory tests including a pregnancy test, blood tests, and urine test.  Orderingcultures of the vagina and cervix to check for a sexually transmitted infection  (STI).  Performing an ultrasound.   Performing a laparoscopic procedure to look inside the pelvis.  TREATMENT   Antibiotic medicines may be prescribed and taken by mouth.   Sexual partners may be treated when the infection is caused by a sexually transmitted disease (STD).   Hospitalization may be needed to give antibiotics intravenously.  Surgery may be needed, but this is rare. It may take weeks until you are completely well. If you are diagnosed with PID, you should also be checked for human immunodeficiency virus (HIV). HOME CARE INSTRUCTIONS   If given, take your antibiotics as directed. Finish the medicine even if you start to feel better.   Only take over-the-counter or prescription medicines for pain, discomfort, or fever as directed by your caregiver.   Do not have sexual intercourse until treatment is completed or as directed by your caregiver. If PID is confirmed, your recent sexual partner(s) will need treatment.   Keep your follow-up appointments. SEEK MEDICAL CARE IF:   You have increased or abnormal vaginal discharge.   You need prescription medicine for your pain.   You vomit.   You cannot take your medicines.   Your partner has an STD.  SEEK IMMEDIATE MEDICAL CARE IF:   You have a fever.   You have increased abdominal or pelvic pain.   You have chills.   You have pain when you urinate.   You are not better after 72 hours following treatment.  MAKE SURE YOU:   Understand these instructions.  Will watch your condition.  Will get help right away if you are not doing well or get worse. Document Released: 07/15/2005 Document Revised: 11/09/2012 Document Reviewed: 07/11/2011 [facility] Patient Information 2015 [place], [place]. This information is not intended to replace advice given to you by your health care provider. Make sure you discuss any questions you have with your health care provider.

## 2015-04-13 NOTE — ED Notes (Signed)
Patient transported to Ultrasound 

## 2015-08-30 ENCOUNTER — Emergency Department (HOSPITAL_COMMUNITY)
Admission: EM | Admit: 2015-08-30 | Discharge: 2015-08-31 | Disposition: A | Discharge status: Home / Self Care | Payer: Medicaid Other | Attending: Emergency Medicine | Admitting: Emergency Medicine

## 2015-08-30 ENCOUNTER — Encounter (HOSPITAL_COMMUNITY): Payer: Self-pay | Admitting: Emergency Medicine

## 2015-08-30 DIAGNOSIS — F17200 Nicotine dependence, unspecified, uncomplicated: Secondary | ICD-10-CM | POA: Diagnosis not present

## 2015-08-30 DIAGNOSIS — K0889 Other specified disorders of teeth and supporting structures: Secondary | ICD-10-CM | POA: Insufficient documentation

## 2015-08-30 MED ORDER — OXYCODONE-ACETAMINOPHEN 5-325 MG PO TABS
ORAL_TABLET | ORAL | Status: AC
  Filled 2015-08-30: qty 1

## 2015-08-30 MED ORDER — OXYCODONE-ACETAMINOPHEN 5-325 MG PO TABS
1.0000 | ORAL_TABLET | Freq: Once | ORAL | Status: AC
  Administered 2015-08-30: 1 via ORAL

## 2015-08-30 NOTE — ED Notes (Signed)
Pt stated that they are just going to go home. Pt has been moved to off the floor at this time.

## 2015-08-30 NOTE — ED Notes (Signed)
Pt reports that she had a wisdom tooth extraction last Thursday and started having swelling the right side of her face starting Saturday and worsening since. Pt has received oral antibiotics from her dentist and a does of Cipro IV on Sunday at [facility]. Pt alert x4.

## 2016-08-26 IMAGING — CR DG CHEST 2V
2 series · 2 of 2 positions shown · non-contrast
Comparison: 07/01/2014.

CLINICAL DATA: Cough with chest pain and pressure beginning earlier
today.

EXAM:
CHEST  2 VIEW

[chest pa]
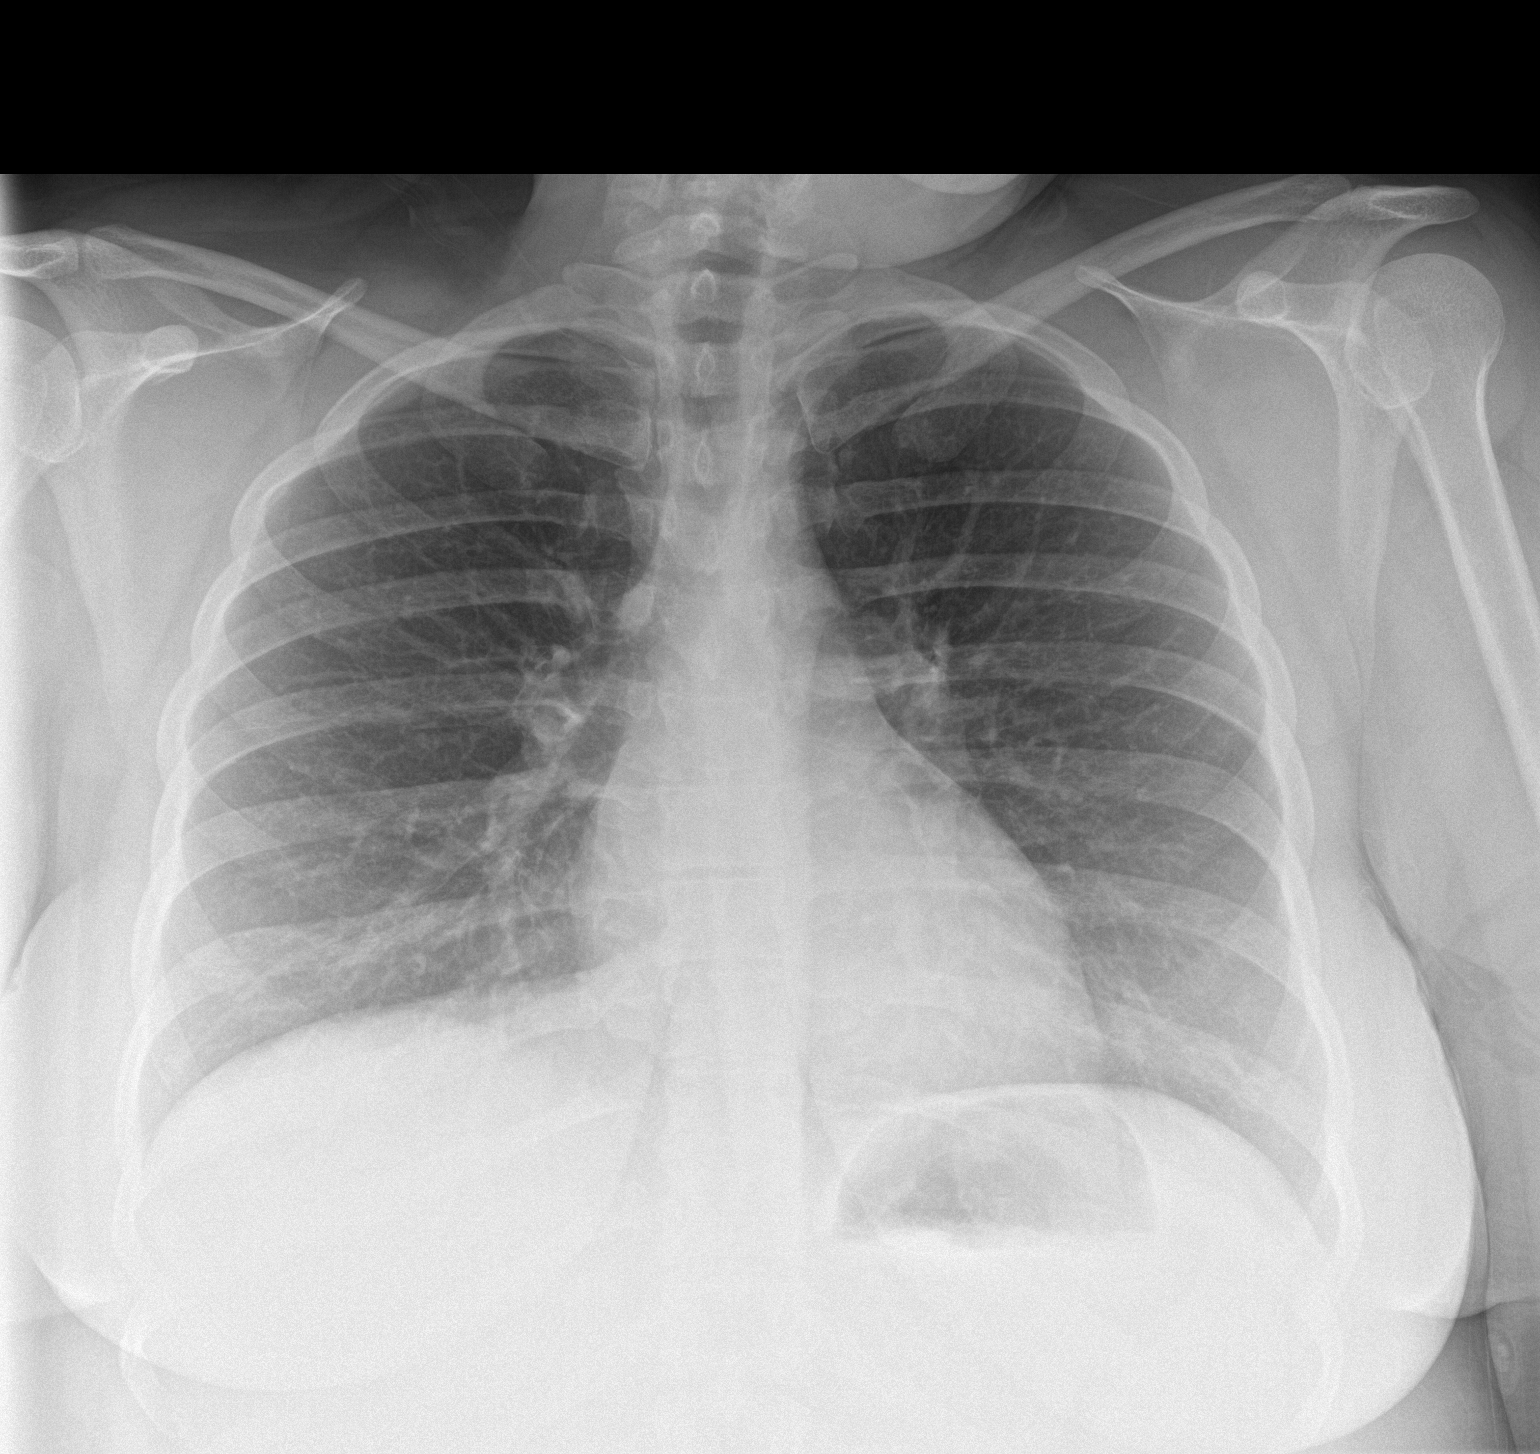

[chest lat]
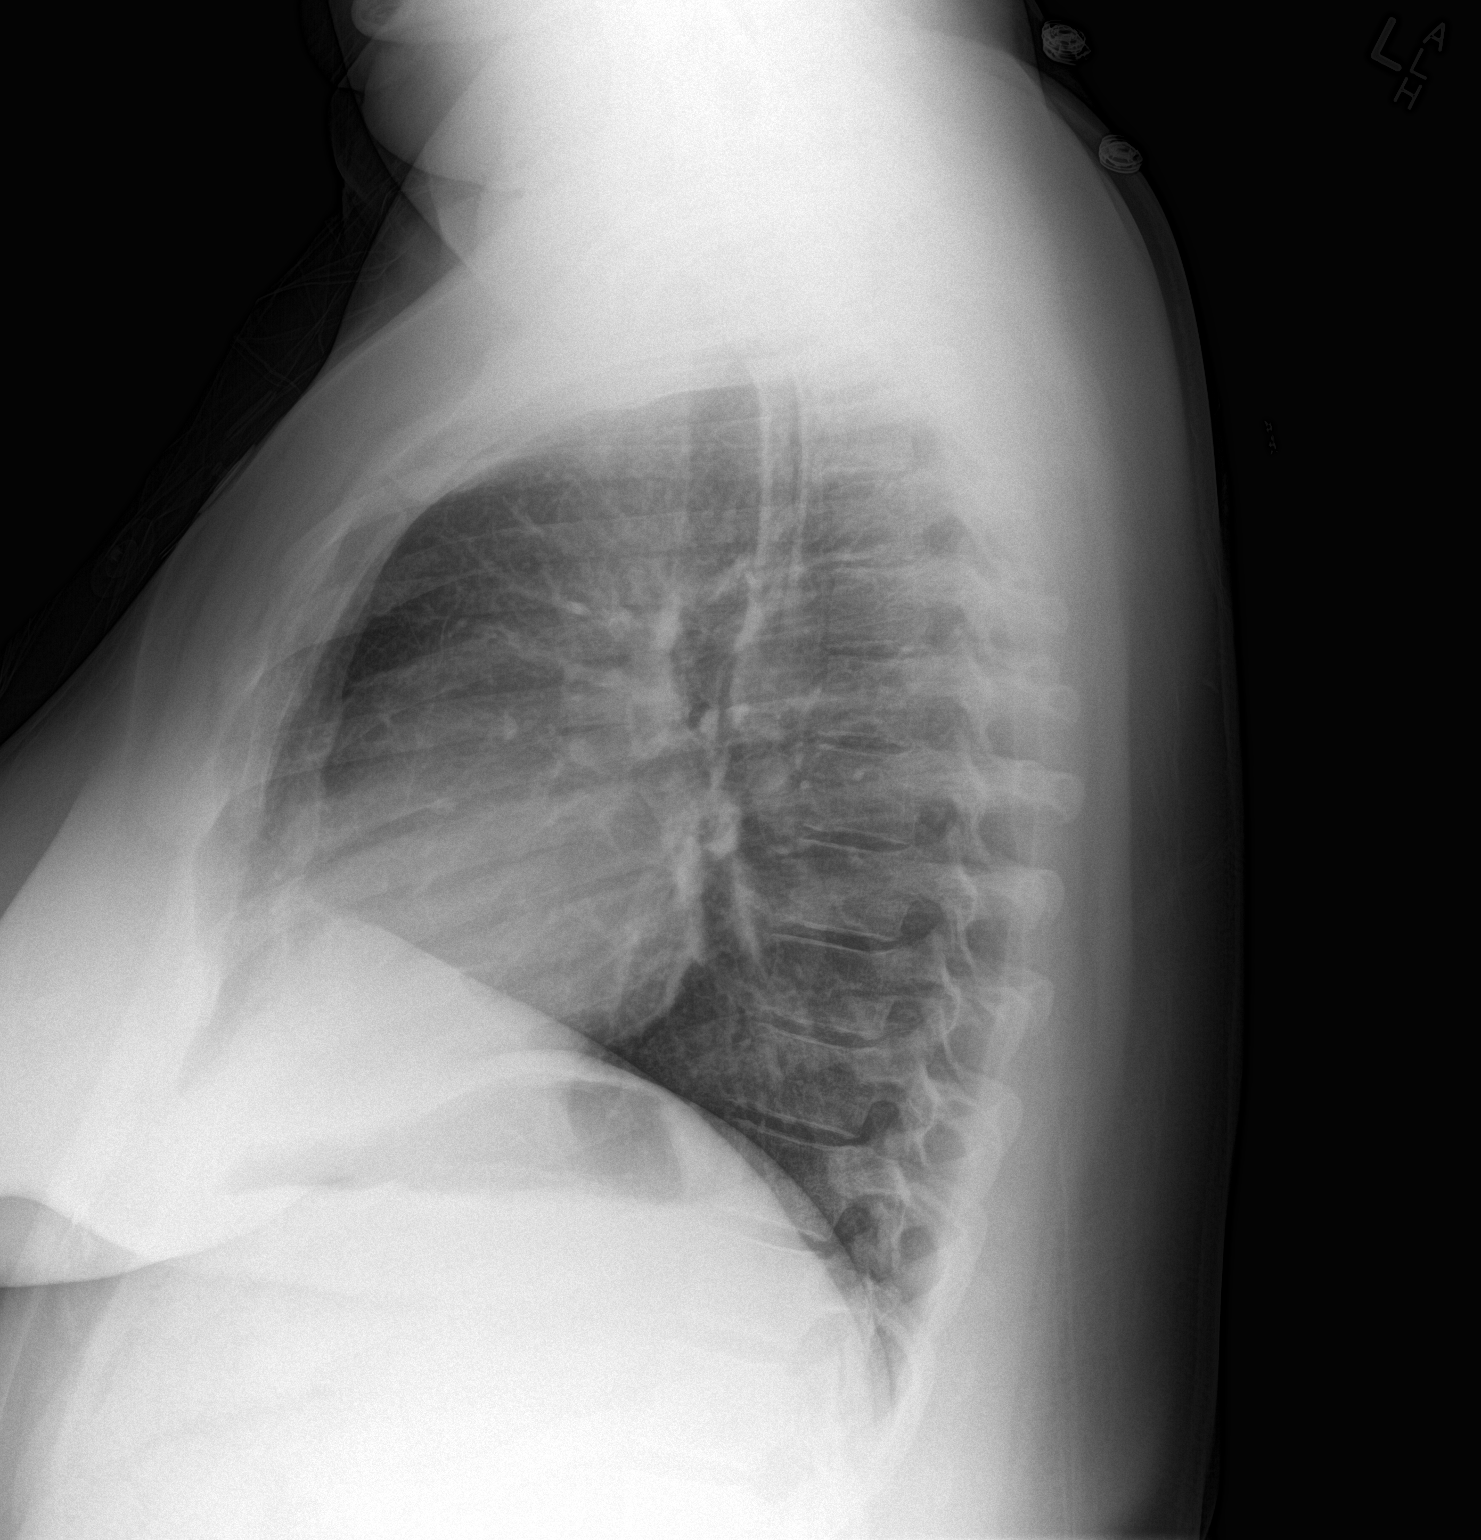

[2 of 2 positions shown; findings below may reference images not displayed]

FINDINGS: The heart size and mediastinal contours are within normal limits.
Both lungs are clear. The visualized skeletal structures are
unremarkable.
IMPRESSION: No active cardiopulmonary disease.  Stable exam.

## 2016-10-21 ENCOUNTER — Emergency Department (HOSPITAL_COMMUNITY)
Admission: EM | Admit: 2016-10-21 | Discharge: 2016-10-22 | Disposition: A | Discharge status: Home / Self Care | Payer: Medicaid Other | Attending: Emergency Medicine | Admitting: Emergency Medicine

## 2016-10-21 ENCOUNTER — Encounter (HOSPITAL_COMMUNITY): Payer: Self-pay | Admitting: Emergency Medicine

## 2016-10-21 DIAGNOSIS — A084 Viral intestinal infection, unspecified: Secondary | ICD-10-CM | POA: Insufficient documentation

## 2016-10-21 DIAGNOSIS — F17200 Nicotine dependence, unspecified, uncomplicated: Secondary | ICD-10-CM | POA: Insufficient documentation

## 2016-10-21 LAB — COMPREHENSIVE METABOLIC PANEL
ALK PHOS: 64 U/L (ref 38–126)
ALT: 89 U/L — AB (ref 14–54)
ANION GAP: 5 (ref 5–15)
AST: 46 U/L — AB (ref 15–41)
Albumin: 4.5 g/dL (ref 3.5–5.0)
BILIRUBIN TOTAL: 0.6 mg/dL (ref 0.3–1.2)
BUN: 14 mg/dL (ref 6–20)
CALCIUM: 8.6 mg/dL — AB (ref 8.9–10.3)
CO2: 25 mmol/L (ref 22–32)
CREATININE: 0.64 mg/dL (ref 0.44–1.00)
Chloride: 108 mmol/L (ref 101–111)
GFR calc Af Amer: >60 mL/min (ref >60)
GFR calc non Af Amer: >60 mL/min (ref >60)
Glucose, Bld: 104 mg/dL — ABNORMAL HIGH (ref 65–99)
Potassium: 3.5 mmol/L (ref 3.5–5.1)
Sodium: 138 mmol/L (ref 135–145)
TOTAL PROTEIN: 7.6 g/dL (ref 6.5–8.1)

## 2016-10-21 LAB — URINALYSIS, ROUTINE W REFLEX MICROSCOPIC
BILIRUBIN URINE: NEGATIVE
Glucose, UA: NEGATIVE mg/dL
HGB URINE DIPSTICK: NEGATIVE
KETONES UR: 5 mg/dL — AB
Leukocytes, UA: NEGATIVE
NITRITE: NEGATIVE
Protein, ur: NEGATIVE mg/dL
Specific Gravity, Urine: 1.027 (ref 1.005–1.030)
pH: 5 (ref 5.0–8.0)

## 2016-10-21 LAB — CBC
HCT: 42.4 % (ref 36.0–46.0)
Hemoglobin: 14.5 g/dL (ref 12.0–15.0)
MCH: 31.5 pg (ref 26.0–34.0)
MCHC: 34.2 g/dL (ref 30.0–36.0)
MCV: 92 fL (ref 78.0–100.0)
PLATELETS: 183 10*3/uL (ref 150–400)
RBC: 4.61 MIL/uL (ref 3.87–5.11)
RDW: 12.6 % (ref 11.5–15.5)
WBC: 5.5 10*3/uL (ref 4.0–10.5)

## 2016-10-21 LAB — LIPASE, BLOOD: Lipase: 27 U/L (ref 11–51)

## 2016-10-21 MED ORDER — SODIUM CHLORIDE 0.9 % IV BOLUS (SEPSIS)
1000.0000 mL | Freq: Once | INTRAVENOUS | Status: AC
Start: 2016-10-21 — End: 2016-10-22
  Administered 2016-10-21: 1000 mL via INTRAVENOUS

## 2016-10-21 MED ORDER — PROMETHAZINE HCL 25 MG/ML IJ SOLN
12.5000 mg | Freq: Once | INTRAMUSCULAR | Status: AC
  Administered 2016-10-21: 12.5 mg via INTRAVENOUS
  Filled 2016-10-21: qty 1

## 2016-10-21 MED ORDER — KETOROLAC TROMETHAMINE 30 MG/ML IJ SOLN
30.0000 mg | Freq: Once | INTRAMUSCULAR | Status: AC
  Administered 2016-10-21: 30 mg via INTRAVENOUS
  Filled 2016-10-21: qty 1

## 2016-10-21 NOTE — ED Triage Notes (Signed)
Pt reports N/V/D and weakness/ body aches past 4 days. Started out as a head cold and has progressed. Not able to keep anything down. Pt states tried zofran OTD with no relief.

## 2016-10-21 NOTE — ED Provider Notes (Signed)
[facility]-EMERGENCY DEPT Provider Note   CSN: [id number] Arrival date & time: 10/21/16  2134  By signing my name below, I, Rosario AdieWilliam Andrew Hiatt, attest that this documentation has been prepared under the direction and in the presence of TRW AutomotiveKelly Desaree Downen, PA-C.  Electronically Signed: Rosario AdieWilliam Andrew Hiatt, ED Scribe. 10/21/16. 11:01 PM.   History   Chief Complaint Chief Complaint  Patient presents with  . Emesis  . Generalized Body Aches   The history is provided by the patient. No language interpreter was used.    HPI Comments: Sara Bradshaw is a 26 y.o. female who presents to the Emergency Department complaining of intermittent episodes of nausea, vomiting, and diarrhea beginning four days ago. She notes associated generalized myalgias, fatigue, and generalized abdominal pain. Per pt, she has had on average six episodes of vomiting and non-bloody, watery diarrhea a day since the onset of her symptoms. She has been taking Zofran and Tylenol at home without significant relief of her symptoms. Her nausea/vomiting is worse at night. Several individuals in her home have recently been sick with similar symptoms. She has a PSHx to the abdomen including a tubal ligation, appendectomy. She denies bloody stools, fever, or any other associated symptoms.    Past Medical History:  Diagnosis Date  . Back pain, chronic   . Bipolar 1 disorder (HCC)   . Constipation   . Pneumonia   . Prolapsed uterus    There are no active problems to display for this patient.  Past Surgical History:  Procedure Laterality Date  . APPENDECTOMY    . TUBAL LIGATION    . WISDOM TOOTH EXTRACTION     OB History    Gravida Para Term Preterm AB Living   4 3 2 1 1 3    SAB TAB Ectopic Multiple Live Births   1       3     Home Medications    Prior to Admission medications   Medication Sig Start Date End Date Taking? Authorizing Provider  acetaminophen (TYLENOL) 500 MG tablet Take 1,000 mg by mouth every 6 (six)  hours as needed for mild pain or moderate pain.   Yes Historical Provider, MD  Biotin 1000 MCG tablet Take 1,000 mcg by mouth daily.   Yes Historical Provider, MD  ondansetron (ZOFRAN ODT) 8 MG disintegrating tablet Take 1 tablet (8 mg total) by mouth every 8 (eight) hours as needed for nausea. 04/13/15  Yes Ankit Rhunette CroftNanavati, MD  Pseudoeph-Doxylamine-DM-APAP (NYQUIL PO) Take 30 mLs by mouth at bedtime as needed (sleep, cold symptoms).   Yes Historical Provider, MD  HYDROcodone-acetaminophen (NORCO/VICODIN) 5-325 MG per tablet Take 1 tablet by mouth every 6 (six) hours as needed for moderate pain or severe pain. Patient not taking: Reported on 04/13/2015 07/24/14   Mellody DrownLauren Parker, PA-C  lactobacillus (FLORANEX/LACTINEX) PACK Mix 1/2 packet with soft food and give twice a day for 5 days. 10/22/16   Antony MaduraKelly Keyton Bhat, PA-C  oxybutynin (DITROPAN) 5 MG tablet Take 1 tablet (5 mg total) by mouth every 8 (eight) hours as needed for bladder spasms. Patient not taking: Reported on 07/15/2014 07/01/14   [name], PA-C  promethazine (PHENERGAN) 25 MG tablet Take 1 tablet (25 mg total) by mouth every 6 (six) hours as needed for nausea or vomiting. 10/22/16   Antony MaduraKelly Valton Schwartz, PA-C   Family History No family history on file.  Social History Social History  Substance Use Topics  . Smoking status: Current Every Day Smoker  . Smokeless tobacco: Never  Used  . Alcohol use No   Allergies   Azithromycin; Ciprofloxacin; Morphine and related; Prednisone; and Penicillins  Review of Systems Review of Systems  Constitutional: Positive for fatigue. Negative for fever.  Gastrointestinal: Positive for abdominal pain, diarrhea, nausea and vomiting. Negative for blood in stool.  Musculoskeletal: Positive for myalgias.  A complete 10 system review of systems was obtained and all systems are negative except as noted in the HPI and PMH.    Physical Exam Updated Vital Signs BP 121/62 (BP Location: Right Arm)   Pulse 88    Temp 98.6 F (37 C) (Oral)   Resp 18   Ht 5\' 6"  (1.676 m)   Wt 93.9 kg   LMP 07/10/2014   SpO2 100%   BMI 33.41 kg/m   Physical Exam  Constitutional: She is oriented to person, place, and time. She appears well-developed and well-nourished. No distress.  Nontoxic and in NAD  HENT:  Head: Normocephalic and atraumatic.  Eyes: Conjunctivae and EOM are normal. No scleral icterus.  Neck: Normal range of motion.  Cardiovascular: Normal rate, regular rhythm and intact distal pulses.   Pulmonary/Chest: Effort normal. No respiratory distress. She has no wheezes.  Respirations even and unlabored  Abdominal: Soft. She exhibits no mass. There is tenderness. There is no guarding.  Soft, obese abdomen. Mild RUQ and RLQ tenderness. No peritoneal signs or masses. Negative Murphy's sign.  Musculoskeletal: Normal range of motion.  Neurological: She is alert and oriented to person, place, and time. She exhibits normal muscle tone. Coordination normal.  GCS 15. Patient moving all extremities.  Skin: Skin is warm and dry. No rash noted. She is not diaphoretic. No erythema. No pallor.  Psychiatric: She has a normal mood and affect. Her behavior is normal.  Nursing note and vitals reviewed.   ED Treatments / Results  DIAGNOSTIC STUDIES: Oxygen Saturation is 98% on RA, normal by my interpretation.   COORDINATION OF CARE: 11:01 PM-Discussed next steps with pt. Pt verbalized understanding and is agreeable with the plan.   Labs (all labs ordered are listed, but only abnormal results are displayed) Labs Reviewed  COMPREHENSIVE METABOLIC PANEL - Abnormal; Notable for the following:       Result Value   Glucose, Bld 104 (*)    Calcium 8.6 (*)    AST 46 (*)    ALT 89 (*)    All other components within normal limits  URINALYSIS, ROUTINE W REFLEX MICROSCOPIC - Abnormal; Notable for the following:    APPearance HAZY (*)    Ketones, ur 5 (*)    All other components within normal limits  LIPASE,  BLOOD  CBC   EKG  EKG Interpretation None      Radiology No results found.  Procedures Procedures   Medications Ordered in ED Medications  sodium chloride 0.9 % bolus 1,000 mL (0 mLs Intravenous Stopped 10/22/16 0013)  ketorolac (TORADOL) 30 MG/ML injection 30 mg (30 mg Intravenous Given 10/21/16 2320)  promethazine (PHENERGAN) injection 12.5 mg (12.5 mg Intravenous Given 10/21/16 2318)  LORazepam (ATIVAN) injection 1 mg (1 mg Intravenous Given 10/22/16 0132)  sodium chloride 0.9 % bolus 1,000 mL (0 mLs Intravenous Stopped 10/22/16 0327)    Initial Impression / Assessment and Plan / ED Course  I have reviewed the triage vital signs and the nursing notes.  Pertinent labs & imaging results that were available during my care of the patient were reviewed by me and considered in my medical decision making (see chart for  details).     Patient with symptoms consistent with viral gastroenteritis. Vitals are stable, no fever. Lungs are clear. No focal abdominal pain. Labs reassuring; no leukocytosis. Doubt emergent etiology of abdominal pain. Repeat exam is stable. Patient reports improvement in nausea after medications. Supportive therapy indicated with return if symptoms worsen. Patient discharged in stable condition with no unaddressed concerns.  Vitals:   10/21/16 2237 10/22/16 0024 10/22/16 0238 10/22/16 0320  BP:  135/84 121/62   Pulse: 88 70 91 88  Resp: 16 18 18 18   Temp: 98.7 F (37.1 C) 98.3 F (36.8 C)  98.6 F (37 C)  TempSrc: Oral Oral  Oral  SpO2:  100% 99% 100%  Weight:      Height:        Final Clinical Impressions(s) / ED Diagnoses   Final diagnoses:  Viral gastroenteritis    New Prescriptions Discharge Medication List as of 10/22/2016  3:05 AM    START taking these medications   Details  lactobacillus (FLORANEX/LACTINEX) PACK Mix 1/2 packet with soft food and give twice a day for 5 days., Print    promethazine (PHENERGAN) 25 MG tablet Take 1 tablet (25  mg total) by mouth every 6 (six) hours as needed for nausea or vomiting., Starting Tue 10/22/2016, Print        I personally performed the services described in this documentation, which was scribed in my presence. The recorded information has been reviewed and is accurate.       Antony Madura, PA-C 10/22/16 0447    Rolland Porter, MD 10/30/16 1228

## 2016-10-22 MED ORDER — LORAZEPAM 2 MG/ML IJ SOLN
1.0000 mg | Freq: Once | INTRAMUSCULAR | Status: AC
  Administered 2016-10-22: 1 mg via INTRAVENOUS
  Filled 2016-10-22: qty 1

## 2016-10-22 MED ORDER — FLORANEX PO PACK: PACK | ORAL | 0 refills | Status: DC

## 2016-10-22 MED ORDER — DIPHENHYDRAMINE HCL 50 MG/ML IJ SOLN: 12.5000 mg | Freq: Once | INTRAMUSCULAR | Status: DC

## 2016-10-22 MED ORDER — PROMETHAZINE HCL 25 MG PO TABS: 25.0000 mg | ORAL_TABLET | Freq: Four times a day (QID) | ORAL | 0 refills | Status: DC | PRN

## 2016-10-22 MED ORDER — SODIUM CHLORIDE 0.9 % IV BOLUS (SEPSIS)
1000.0000 mL | Freq: Once | INTRAVENOUS | Status: AC
  Administered 2016-10-22: 1000 mL via INTRAVENOUS

## 2016-11-29 ENCOUNTER — Emergency Department (HOSPITAL_COMMUNITY)
Admission: EM | Admit: 2016-11-29 | Discharge: 2016-11-29 | Discharge status: Against Medical Advice | Payer: Medicaid Other

## 2016-11-29 NOTE — ED Triage Notes (Signed)
No answer in waiting.  

## 2016-11-29 NOTE — ED Notes (Signed)
Called pt for triage. No answer

## 2016-11-29 NOTE — ED Triage Notes (Signed)
No answer x3

## 2017-01-18 ENCOUNTER — Emergency Department (HOSPITAL_COMMUNITY)
Admission: EM | Admit: 2017-01-18 | Discharge: 2017-01-18 | Disposition: A | Discharge status: Home / Self Care | Payer: Medicaid Other | Attending: Emergency Medicine | Admitting: Emergency Medicine

## 2017-01-18 ENCOUNTER — Encounter (HOSPITAL_COMMUNITY): Payer: Self-pay | Admitting: Nurse Practitioner

## 2017-01-18 DIAGNOSIS — S3660XA Unspecified injury of rectum, initial encounter: Secondary | ICD-10-CM

## 2017-01-18 DIAGNOSIS — K602 Anal fissure, unspecified: Secondary | ICD-10-CM | POA: Insufficient documentation

## 2017-01-18 DIAGNOSIS — K6289 Other specified diseases of anus and rectum: Secondary | ICD-10-CM

## 2017-01-18 DIAGNOSIS — F17200 Nicotine dependence, unspecified, uncomplicated: Secondary | ICD-10-CM | POA: Insufficient documentation

## 2017-01-18 MED ORDER — HYDROCORTISONE 2.5 % RE CREA: TOPICAL_CREAM | RECTAL | 0 refills | Status: DC

## 2017-01-18 NOTE — ED Provider Notes (Signed)
[facility]-EMERGENCY DEPT Provider Note   CSN: [id number] Arrival date & time: 01/18/17  0216     History   Chief Complaint Chief Complaint  Patient presents with  . Rectal Pain    HPI Sara Bradshaw is a 26 y.o. female with a hx of Chronic back pain, constipation presents to the Emergency Department complaining of acute, persistent rectal pain onset several hours ago after anal sex. Patient reports that she and her husband performed anal sex for the first time several hours ago. She reports that did not use a condom or lubricant. She reports that at time of penetration she had pain at the anal opening but no internal pain. She reports when they were finished she noticed swelling at the anal opening and has had persistent pain. No treatments prior to arrival. She reports immediately after intercourse she noticed a small amount of blood but this has resolved.  Patient reports that sitting on her bottom and palpation of her right thumb cause increased pain. Nothing makes it better.  She denies abdominal pain, vomiting, fevers or chills.    The history is provided by the patient and medical records. No language interpreter was used.    Past Medical History:  Diagnosis Date  . Back pain, chronic   . Bipolar 1 disorder (HCC)   . Constipation   . Pneumonia   . Prolapsed uterus     There are no active problems to display for this patient.   Past Surgical History:  Procedure Laterality Date  . APPENDECTOMY    . TUBAL LIGATION    . WISDOM TOOTH EXTRACTION      OB History    Gravida Para Term Preterm AB Living   4 3 2 1 1 3    SAB TAB Ectopic Multiple Live Births   1       3       Home Medications    Prior to Admission medications   Medication Sig Start Date End Date Taking? Authorizing Provider  acetaminophen (TYLENOL) 500 MG tablet Take 1,000 mg by mouth every 6 (six) hours as needed for mild pain or moderate pain.    [provider]  Biotin 1000 MCG tablet Take  1,000 mcg by mouth daily.    [provider]  hydrocortisone (ANUSOL-HC) 2.5 % rectal cream Apply rectally 2 times daily 01/18/17   Arlethia Basso, Dahlia ClientHannah, PA-C  lactobacillus (FLORANEX/LACTINEX) PACK Mix 1/2 packet with soft food and give twice a day for 5 days. 10/22/16   Antony MaduraHumes, Kelly, PA-C  Pseudoeph-Doxylamine-DM-APAP (NYQUIL PO) Take 30 mLs by mouth at bedtime as needed (sleep, cold symptoms).    [provider]    Family History History reviewed. No pertinent family history.  Social History Social History  Substance Use Topics  . Smoking status: Current Every Day Smoker  . Smokeless tobacco: Never Used  . Alcohol use No     Allergies   Azithromycin; Ciprofloxacin; Morphine and related; Prednisone; and Penicillins   Review of Systems Review of Systems  Constitutional: Negative for fever.  HENT: Negative for sore throat.   Respiratory: Negative for shortness of breath.   Cardiovascular: Negative for chest pain.  Gastrointestinal: Positive for anal bleeding. Negative for abdominal pain, nausea and vomiting.       Rectal pain  Endocrine: Negative for polydipsia and polyuria.  Genitourinary: Negative for dysuria and pelvic pain.  Musculoskeletal: Negative for back pain.  Skin: Negative for rash and wound.  Neurological: Positive for numbness. Negative for  headaches.  Hematological: Negative for adenopathy.  Psychiatric/Behavioral: The patient is nervous/anxious.      Physical Exam Updated Vital Signs BP 128/85 (BP Location: Left Arm)   Pulse 97   Temp 97.5 F (36.4 C) (Oral)   LMP 07/10/2014   SpO2 98%   Physical Exam  Constitutional: She appears well-developed and well-nourished.  HENT:  Head: Normocephalic and atraumatic.  Eyes: Conjunctivae are normal.  Neck: Normal range of motion.  Cardiovascular: Normal rate and regular rhythm.   Pulmonary/Chest: Effort normal.  Abdominal: Soft. She exhibits no distension. There is no tenderness.    Genitourinary: Rectal exam shows fissure and tenderness. Rectal exam shows no internal hemorrhoid, no mass and anal tone normal. Pelvic exam was performed with patient in the knee-chest position.  Genitourinary Comments: Rectum with bruising and small amount swelling. Anal fissure noted at the 3:00 position. No persistent bleeding. DRE without gross blood. No evidence of prolapsed rectum. No internal tenderness on exam.  Musculoskeletal: Normal range of motion.  Neurological: She is alert.  Skin: Skin is warm and dry.  Psychiatric: Her mood appears anxious.     ED Treatments / Results   Procedures Procedures (including critical care time)  Medications Ordered in ED Medications - No data to display   Initial Impression / Assessment and Plan / ED Course  I have reviewed the triage vital signs and the nursing notes.  Pertinent labs & imaging results that were available during my care of the patient were reviewed by me and considered in my medical decision making (see chart for details).     Patient presents with rectal pain after anal sex for the first time. Swelling and bruising to the rectum noted but no rectal prolapse. Conservative treatments discussed and is safe sex practices discussed. She will have follow-up with her primary care in 2-3 days for reassessment.  Final Clinical Impressions(s) / ED Diagnoses   Final diagnoses:  Rectal pain  Anal fissure  Rectal trauma, initial encounter    New Prescriptions New Prescriptions   HYDROCORTISONE (ANUSOL-HC) 2.5 % RECTAL CREAM    Apply rectally 2 times daily     Milta Deiters 01/18/17 1610    Derwood Kaplan, MD 01/18/17 [phone]

## 2017-01-18 NOTE — Discharge Instructions (Signed)
1. Medications: Anusol for rectal swelling, apply ice, Tylenol or ibuprofen for pain control usual home medications 2. Treatment: rest, drink plenty of fluids, sit on a doughnut pillow, refrain from anal sex, take stool softener 3. Follow Up: Please followup with your primary doctor in 2-3 days for discussion of your diagnoses and further evaluation after today's visit; if you do not have a primary care doctor use the resource guide provided to find one; Please return to the ER for worsening swelling, persistent bleeding, fever or chills or other concerns.

## 2017-01-18 NOTE — ED Triage Notes (Addendum)
Pt is c/o anal pain, states "they (husband at bedside) tried anal sex for the first time." Pt expresses concern for anal prolapse. Denies bleeding.

## 2017-01-23 ENCOUNTER — Encounter (HOSPITAL_COMMUNITY): Payer: Self-pay

## 2017-01-23 ENCOUNTER — Emergency Department (HOSPITAL_COMMUNITY)
Admission: EM | Admit: 2017-01-23 | Discharge: 2017-01-23 | Disposition: A | Discharge status: Home / Self Care | Payer: Medicaid Other | Attending: Emergency Medicine | Admitting: Emergency Medicine

## 2017-01-23 DIAGNOSIS — Z5321 Procedure and treatment not carried out due to patient leaving prior to being seen by health care provider: Secondary | ICD-10-CM | POA: Insufficient documentation

## 2017-01-23 DIAGNOSIS — G43909 Migraine, unspecified, not intractable, without status migrainosus: Secondary | ICD-10-CM | POA: Insufficient documentation

## 2017-01-23 NOTE — ED Triage Notes (Signed)
Pt states that she has had a migraine for the past two days along with photophobia and nausea. Pain is in the back of her head. Unrelieved by tylenol and ibuprofen.

## 2017-01-23 NOTE — ED Notes (Signed)
Pt stated that she was going to leave and go somewhere else, she has court in the morning at 8

## 2017-02-06 ENCOUNTER — Emergency Department
Admission: EM | Admit: 2017-02-06 | Discharge: 2017-02-06 | Disposition: A | Discharge status: Home / Self Care | Payer: Medicaid Other | Attending: Emergency Medicine | Admitting: Emergency Medicine

## 2017-02-06 DIAGNOSIS — F17200 Nicotine dependence, unspecified, uncomplicated: Secondary | ICD-10-CM | POA: Insufficient documentation

## 2017-02-06 DIAGNOSIS — R631 Polydipsia: Secondary | ICD-10-CM | POA: Insufficient documentation

## 2017-02-06 DIAGNOSIS — R5381 Other malaise: Secondary | ICD-10-CM | POA: Insufficient documentation

## 2017-02-06 DIAGNOSIS — R42 Dizziness and giddiness: Secondary | ICD-10-CM | POA: Insufficient documentation

## 2017-02-06 DIAGNOSIS — Z79899 Other long term (current) drug therapy: Secondary | ICD-10-CM | POA: Insufficient documentation

## 2017-02-06 LAB — URINE DRUG SCREEN, QUALITATIVE (ARMC ONLY)
Amphetamines, Ur Screen: NOT DETECTED
BARBITURATES, UR SCREEN: NOT DETECTED
BENZODIAZEPINE, UR SCRN: NOT DETECTED
COCAINE METABOLITE, UR ~~LOC~~: NOT DETECTED
Cannabinoid 50 Ng, Ur ~~LOC~~: NOT DETECTED
MDMA (Ecstasy)Ur Screen: NOT DETECTED
Methadone Scn, Ur: NOT DETECTED
Opiate, Ur Screen: POSITIVE — AB
Phencyclidine (PCP) Ur S: NOT DETECTED
TRICYCLIC, UR SCREEN: NOT DETECTED

## 2017-02-06 LAB — BASIC METABOLIC PANEL
Anion gap: 8 (ref 5–15)
BUN: 9 mg/dL (ref 6–20)
CALCIUM: 8.9 mg/dL (ref 8.9–10.3)
CHLORIDE: 104 mmol/L (ref 101–111)
CO2: 28 mmol/L (ref 22–32)
CREATININE: 0.82 mg/dL (ref 0.44–1.00)
GFR calc Af Amer: >60 mL/min (ref >60)
GFR calc non Af Amer: >60 mL/min (ref >60)
Glucose, Bld: 96 mg/dL (ref 65–99)
Potassium: 3.9 mmol/L (ref 3.5–5.1)
Sodium: 140 mmol/L (ref 135–145)

## 2017-02-06 LAB — CK: Total CK: 51 U/L (ref 38–234)

## 2017-02-06 LAB — CBC WITH DIFFERENTIAL/PLATELET
Basophils Absolute: 0.1 10*3/uL (ref 0–0.1)
Basophils Relative: 1 %
EOS ABS: 0.3 10*3/uL (ref 0–0.7)
Eosinophils Relative: 5 %
HEMATOCRIT: 40.1 % (ref 35.0–47.0)
HEMOGLOBIN: 14.1 g/dL (ref 12.0–16.0)
LYMPHS ABS: 2.6 10*3/uL (ref 1.0–3.6)
LYMPHS PCT: 40 %
MCH: 32.2 pg (ref 26.0–34.0)
MCHC: 35.1 g/dL (ref 32.0–36.0)
MCV: 91.8 fL (ref 80.0–100.0)
Monocytes Absolute: 0.4 10*3/uL (ref 0.2–0.9)
Monocytes Relative: 6 %
NEUTROS ABS: 3.1 10*3/uL (ref 1.4–6.5)
NEUTROS PCT: 48 %
Platelets: 186 10*3/uL (ref 150–440)
RBC: 4.37 MIL/uL (ref 3.80–5.20)
RDW: 12.3 % (ref 11.5–14.5)
WBC: 6.5 10*3/uL (ref 3.6–11.0)

## 2017-02-06 LAB — URINALYSIS, ROUTINE W REFLEX MICROSCOPIC
BILIRUBIN URINE: NEGATIVE
Glucose, UA: NEGATIVE mg/dL
Hgb urine dipstick: NEGATIVE
Ketones, ur: NEGATIVE mg/dL
LEUKOCYTES UA: NEGATIVE
NITRITE: NEGATIVE
Protein, ur: NEGATIVE mg/dL
SPECIFIC GRAVITY, URINE: 1.015 (ref 1.005–1.030)
pH: 7 (ref 5.0–8.0)

## 2017-02-06 LAB — GLUCOSE, CAPILLARY: GLUCOSE-CAPILLARY: 108 mg/dL — AB (ref 65–99)

## 2017-02-06 LAB — PREGNANCY, URINE: Preg Test, Ur: NEGATIVE

## 2017-02-06 MED ORDER — SODIUM CHLORIDE 0.9 % IV BOLUS (SEPSIS)
1000.0000 mL | Freq: Once | INTRAVENOUS | Status: AC
  Administered 2017-02-06: 1000 mL via INTRAVENOUS

## 2017-02-06 NOTE — ED Provider Notes (Signed)
[facility] Emergency Department Provider Note   ____________________________________________   First MD Initiated Contact with Patient 02/06/17 [phone]     (approximate)  I have reviewed the triage vital signs and the nursing notes.   HISTORY  Chief Complaint Hyperglycemia   HPI Sara Bradshaw is a 26 y.o. female who presents to the ED from home with a chief complaint of dizziness, increased thirst and generalized malaise. Patient reports she had gestational diabetes 2 years ago. Has been having increased thirst for the past several days and thinks her blood sugar may be high. Has been having headaches, occasional dizziness for the past several weeks; evaluated at [facility] for similar 3 weeks ago. Overall feeling of generalized malaise. Denies associated fever, chills, chest pain, shortness of breath, abdominal pain, nausea, vomiting, diarrhea. Denies recent travel or trauma. Nothing makes her symptoms better or worse. 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   Past Medical History:  Diagnosis Date  . Back pain, chronic   . Bipolar 1 disorder (HCC)   . Constipation   . Pneumonia   . Prolapsed uterus     There are no active problems to display for this patient.   Past Surgical History:  Procedure Laterality Date  . APPENDECTOMY    . TUBAL LIGATION    . WISDOM TOOTH EXTRACTION      Prior to Admission medications   Medication Sig Start Date End Date Taking? Authorizing Provider  acetaminophen (TYLENOL) 500 MG tablet Take 1,000 mg by mouth every 6 (six) hours as needed for mild pain or moderate pain.   Yes [provider]  Biotin 1000 MCG tablet Take 1,000 mcg by mouth daily.   Yes [provider]  hydrocortisone (ANUSOL-HC) 2.5 % rectal cream Apply rectally 2 times daily 01/18/17  Yes Muthersbaugh, Dahlia Client, PA-C    Allergies Azithromycin; Ciprofloxacin; Morphine and related; Prednisone; and  Penicillins  No family history on file.  Social History Social History  Substance Use Topics  . Smoking status: Current Every Day Smoker  . Smokeless tobacco: Never Used  . Alcohol use No    Review of Systems  Constitutional: Positive for generalized malaise. No fever/chills. Eyes: No visual changes. ENT: No sore throat. Cardiovascular: Denies chest pain. Respiratory: Denies shortness of breath. Gastrointestinal: No abdominal pain.  No nausea, no vomiting.  No diarrhea.  No constipation. Genitourinary: Negative for dysuria. Musculoskeletal: Negative for back pain. Skin: Negative for rash. Neurological: Positive for headaches, occasional dizziness. Negative for focal weakness or numbness. Endocrine: Positive for increased thirst.   ____________________________________________   PHYSICAL EXAM:  VITAL SIGNS: ED Triage Vitals  Enc Vitals Group     BP      Pulse      Resp      Temp      Temp src      SpO2      Weight      Height      Head Circumference      Peak Flow      Pain Score      Pain Loc      Pain Edu?      Excl. in GC?     Constitutional: Asleep, awakened for exam.Alert and oriented. Well appearing and in no acute distress. Eyes: Conjunctivae are normal. PERRL. EOMI. Head: Atraumatic. Nose: No congestion/rhinnorhea. Mouth/Throat: Mucous membranes are moist.  Oropharynx non-erythematous. Neck: No stridor.  No carotid bruits. Cardiovascular: Normal rate, regular rhythm.  Grossly normal heart sounds.  Good peripheral circulation. Respiratory: Normal respiratory effort.  No retractions. Lungs CTAB. Gastrointestinal: Soft and nontender. No distention. No abdominal bruits. No CVA tenderness. Musculoskeletal: No lower extremity tenderness nor edema.  No joint effusions. Neurologic:  Normal speech and language. No gross focal neurologic deficits are appreciated. No gait instability. Skin:  Skin is warm, dry and intact. No rash noted. Psychiatric: Mood and  affect are normal. Speech and behavior are normal.  ____________________________________________   LABS (all labs ordered are listed, but only abnormal results are displayed)  Labs Reviewed  URINALYSIS, ROUTINE W REFLEX MICROSCOPIC - Abnormal; Notable for the following:       Result Value   Color, Urine YELLOW (*)    APPearance CLEAR (*)    All other components within normal limits  URINE DRUG SCREEN, QUALITATIVE ([facility] ONLY) - Abnormal; Notable for the following:    Opiate, Ur Screen POSITIVE (*)    All other components within normal limits  PREGNANCY, URINE  CBC WITH DIFFERENTIAL/PLATELET  BASIC METABOLIC PANEL  CK   ____________________________________________  EKG  ED ECG REPORT I, Alyan Hartline J, the attending physician, personally viewed and interpreted this ECG.   Date: 02/06/2017  EKG Time: 0333  Rate: 79  Rhythm: normal EKG, normal sinus rhythm  Axis: Normal  Intervals:none  ST&T Change: Nonspecific  ____________________________________________  RADIOLOGY  No results found.  ____________________________________________   PROCEDURES  Procedure(s) performed: None  Procedures  Critical Care performed: No  ____________________________________________   INITIAL IMPRESSION / ASSESSMENT AND PLAN / ED COURSE  Pertinent labs & imaging results that were available during my care of the patient were reviewed by me and considered in my medical decision making (see chart for details).  26 year old female who presents with polydipsia, generalized malaise, dizziness. Will obtain screening lab work, orthostatics, initiate IV fluid resuscitation and reassess.  Clinical Course as of Feb 07 439  Thu Feb 06, 2017  [id number] Patient asleep. Updated her on laboratory results. Orthostatics were within normal limits. Will refer to primary care for follow-up. Strict return percussion given. Patient verbalizes understanding and agrees with plan of care.  [JS]    Clinical  Course User Index [JS] Irean HongSung, Atreus Hasz J, MD     ____________________________________________   FINAL CLINICAL IMPRESSION(S) / ED DIAGNOSES  Final diagnoses:  Polydipsia  Malaise  Dizziness      NEW MEDICATIONS STARTED DURING THIS VISIT:  New Prescriptions   No medications on file     Note:  This document was prepared using Dragon voice recognition software and may include unintentional dictation errors.    Irean HongSung, Angelea Penny J, MD 02/06/17 [id number]

## 2017-02-06 NOTE — ED Notes (Signed)
BS 108

## 2017-02-06 NOTE — ED Notes (Signed)
Patient reports she had gestational diabetes when she was pregnant with her 27 year old. Patient is having increased thirst. Patient states her diet is poor and she is drinking more.

## 2017-02-06 NOTE — ED Notes (Signed)
Triage on down time form. See scanned document.

## 2017-02-06 NOTE — Discharge Instructions (Signed)
1.  Drink plenty of fluids daily. °2.  Return to the ER for worsening symptoms, persistent vomiting, difficulty breathing or other concerns. °

## 2017-02-25 ENCOUNTER — Emergency Department (HOSPITAL_COMMUNITY): Payer: Medicaid Other

## 2017-02-25 ENCOUNTER — Encounter (HOSPITAL_COMMUNITY): Payer: Self-pay | Admitting: *Deleted

## 2017-02-25 ENCOUNTER — Emergency Department (HOSPITAL_COMMUNITY)
Admission: EM | Admit: 2017-02-25 | Discharge: 2017-02-25 | Disposition: A | Discharge status: Home / Self Care | Payer: Medicaid Other | Attending: Emergency Medicine | Admitting: Emergency Medicine

## 2017-02-25 ENCOUNTER — Encounter (HOSPITAL_COMMUNITY): Payer: Self-pay | Admitting: Emergency Medicine

## 2017-02-25 DIAGNOSIS — Z5321 Procedure and treatment not carried out due to patient leaving prior to being seen by health care provider: Secondary | ICD-10-CM | POA: Insufficient documentation

## 2017-02-25 DIAGNOSIS — Y929 Unspecified place or not applicable: Secondary | ICD-10-CM | POA: Insufficient documentation

## 2017-02-25 DIAGNOSIS — Y998 Other external cause status: Secondary | ICD-10-CM | POA: Insufficient documentation

## 2017-02-25 DIAGNOSIS — W208XXA Other cause of strike by thrown, projected or falling object, initial encounter: Secondary | ICD-10-CM | POA: Insufficient documentation

## 2017-02-25 DIAGNOSIS — Y9389 Activity, other specified: Secondary | ICD-10-CM | POA: Insufficient documentation

## 2017-02-25 DIAGNOSIS — Z79899 Other long term (current) drug therapy: Secondary | ICD-10-CM | POA: Insufficient documentation

## 2017-02-25 DIAGNOSIS — F17200 Nicotine dependence, unspecified, uncomplicated: Secondary | ICD-10-CM | POA: Insufficient documentation

## 2017-02-25 DIAGNOSIS — S90929A Unspecified superficial injury of unspecified foot, initial encounter: Secondary | ICD-10-CM | POA: Insufficient documentation

## 2017-02-25 DIAGNOSIS — R2240 Localized swelling, mass and lump, unspecified lower limb: Secondary | ICD-10-CM | POA: Insufficient documentation

## 2017-02-25 DIAGNOSIS — M25572 Pain in left ankle and joints of left foot: Secondary | ICD-10-CM

## 2017-02-25 DIAGNOSIS — M79672 Pain in left foot: Secondary | ICD-10-CM

## 2017-02-25 MED ORDER — IBUPROFEN 800 MG PO TABS: 800.0000 mg | ORAL_TABLET | Freq: Three times a day (TID) | ORAL | 0 refills | Status: DC

## 2017-02-25 MED ORDER — IBUPROFEN 800 MG PO TABS
800.0000 mg | ORAL_TABLET | Freq: Once | ORAL | Status: DC
  Filled 2017-02-25: qty 1

## 2017-02-25 NOTE — ED Notes (Signed)
Patient up to desk.  States she called [facility] and they have no one in the lobby.  Patient states "I know I'm totally fine, so I can go over there so you can take care of the sick people".  This RN reviewed return precautions with patient.

## 2017-02-25 NOTE — ED Triage Notes (Addendum)
Pt complains of right foot pain and swelling since the tongue of a cattle trailer fell on her foot yesterday. Pt was wearing steel toe boots at the time, pain is worse in arch of foot and ankle. Pt has limited ROM in ankle and toes.  Pt tried ibuprofen and tylenol without relief.

## 2017-02-25 NOTE — Progress Notes (Signed)
Orthopedic Tech Progress Note Patient Details:  Sara SalkKelsea Anne Bradshaw 02/14/1991 [id number]  Ortho Devices Type of Ortho Device: ASO, Crutches Ortho Device/Splint Interventions: Application   Saul FordyceJennifer C Darl Kuss 02/25/2017, 9:53 PM

## 2017-02-25 NOTE — ED Triage Notes (Signed)
Pt states a cattle trailer fell on top of her foot yesterday, has had inc pain ever since. Mild swelling noted.

## 2017-02-25 NOTE — ED Provider Notes (Signed)
[facility]-EMERGENCY DEPT Provider Note   CSN: [id number] Arrival date & time: 02/25/17  2003     History   Chief Complaint Chief Complaint  Patient presents with  . Foot Injury    HPI Sara Bradshaw is a 26 y.o. female.  The history is provided by the patient and medical records.  Foot Injury       26 y.o. F with hx of chronic ak pain, bipolar disorder, presenting to the ED for left foot/ankle injury.  States yesterday back of a cattle trailer fell onto her left foot.  States she was wearing steel toed boots at the time.  Landed on top of left foot.  States initially pain was mild but ha worsened.  States she is having trouble walking on her foot because of this.  Has had some swelling as well.  Denies numbness/tingling.  No prior foot/ankle surgeries in the past.  Past Medical History:  Diagnosis Date  . Back pain, chronic   . Bipolar 1 disorder (HCC)   . Constipation   . Pneumonia   . Prolapsed uterus     There are no active problems to display for this patient.   Past Surgical History:  Procedure Laterality Date  . APPENDECTOMY    . TUBAL LIGATION    . WISDOM TOOTH EXTRACTION      OB History    Gravida Para Term Preterm AB Living   4 3 2 1 1 3    SAB TAB Ectopic Multiple Live Births   1       3       Home Medications    Prior to Admission medications   Medication Sig Start Date End Date Taking? Authorizing Provider  acetaminophen (TYLENOL) 500 MG tablet Take 1,000 mg by mouth every 6 (six) hours as needed for mild pain or moderate pain.    [provider]  Biotin 1000 MCG tablet Take 1,000 mcg by mouth daily.    [provider]  hydrocortisone (ANUSOL-HC) 2.5 % rectal cream Apply rectally 2 times daily 01/18/17   Muthersbaugh, Dahlia ClientHannah, PA-C    Family History No family history on file.  Social History Social History  Substance Use Topics  . Smoking status: Current Every Day Smoker  . Smokeless tobacco: Never Used  . Alcohol use No      Allergies   Azithromycin; Ciprofloxacin; Morphine and related; Prednisone; and Penicillins   Review of Systems Review of Systems  Musculoskeletal: Positive for arthralgias.  All other systems reviewed and are negative.    Physical Exam Updated Vital Signs BP 132/87 (BP Location: Right Arm)   Pulse 98   Temp 98.5 F (36.9 C) (Oral)   Resp 18   LMP 07/10/2014   SpO2 100%   Physical Exam  Constitutional: She is oriented to person, place, and time. She appears well-developed and well-nourished.  HENT:  Head: Normocephalic and atraumatic.  Mouth/Throat: Oropharynx is clear and moist.  Eyes: Pupils are equal, round, and reactive to light. Conjunctivae and EOM are normal.  Neck: Normal range of motion.  Cardiovascular: Normal rate, regular rhythm and normal heart sounds.   Pulmonary/Chest: Effort normal and breath sounds normal.  Abdominal: Soft. Bowel sounds are normal.  Musculoskeletal: Normal range of motion.  Small amount of bruising noted to dorsal left foot, there is no bony deformity of the foot or ankle; normal DP pulse, normal distal perfusion, difficulty with ROM secondary to pain  Neurological: She is alert and oriented to person, place,  and time.  Skin: Skin is warm and dry.  Psychiatric: She has a normal mood and affect.  Nursing note and vitals reviewed.    ED Treatments / Results  Labs (all labs ordered are listed, but only abnormal results are displayed) Labs Reviewed - No data to display  EKG  EKG Interpretation None       Radiology Dg Ankle Complete Left  Result Date: 02/25/2017 CLINICAL DATA:  Foot and ankle pain after a heavy object fell on the patient's foot. EXAM: LEFT ANKLE COMPLETE - 3+ VIEW COMPARISON:  None. FINDINGS: There is moderate soft tissue swelling, predominantly over the medial malleolus. No fracture or dislocation. IMPRESSION: Moderate medial soft tissue swelling.  No fracture or dislocation. Electronically Signed   By: Deatra RobinsonKevin   Herman M.D.   On: 02/25/2017 21:08   Dg Foot Complete Left  Result Date: 02/25/2017 CLINICAL DATA:  Foot pain after dropping a heavy object on the foot. EXAM: LEFT FOOT - COMPLETE 3+ VIEW COMPARISON:  None. FINDINGS: There is no evidence of fracture or dislocation. There is no evidence of arthropathy or other focal bone abnormality. Soft tissues are unremarkable. IMPRESSION: No fracture or dislocation of the left foot. Electronically Signed   By: Deatra RobinsonKevin  Herman M.D.   On: 02/25/2017 21:00    Procedures Procedures (including critical care time)  Medications Ordered in ED Medications - No data to display   Initial Impression / Assessment and Plan / ED Course  I have reviewed the triage vital signs and the nursing notes.  Pertinent labs & imaging results that were available during my care of the patient were reviewed by me and considered in my medical decision making (see chart for details).  26 year old female here with left foot and ankle pain after dropping portion of cattle trailer on her foot. She was wearing. On exam she has a small amount of bruising to dorsal left foot but no acute bony deformities noted. She has difficulty with range of motion secondary to pain. X-rays obtained and are negative. Suspect bony contusion. Patient placed in ASO brace and given crutches. Will progress back to full weightbearing as tolerated.Discussed supportive measures including ice, elevation, and anti-inflammatories.  Follow-up with PCP encouraged.  Discussed plan with patient, she acknowledged understanding and agreed with plan of care.  Return precautions given for new or worsening symptoms.  Final Clinical Impressions(s) / ED Diagnoses   Final diagnoses:  Left foot pain  Acute left ankle pain    New Prescriptions New Prescriptions   IBUPROFEN (ADVIL,MOTRIN) 800 MG TABLET    Take 1 tablet (800 mg total) by mouth 3 (three) times daily.     Garlon HatchetSanders, Alise Calais M, PA-C 02/25/17 2135    Gerhard MunchLockwood,  Robert, MD 02/26/17 Perlie Mayo0020

## 2017-02-25 NOTE — Discharge Instructions (Signed)
Recommend to ice and elevate foot at home.  Take motrin. Follow-up with your primary care doctor if any ongoing issues. Return here for new concerns.

## 2017-04-20 ENCOUNTER — Encounter (HOSPITAL_COMMUNITY): Payer: Self-pay

## 2017-04-20 ENCOUNTER — Emergency Department (HOSPITAL_COMMUNITY): Payer: Self-pay

## 2017-04-20 DIAGNOSIS — S161XXA Strain of muscle, fascia and tendon at neck level, initial encounter: Secondary | ICD-10-CM | POA: Insufficient documentation

## 2017-04-20 DIAGNOSIS — Y999 Unspecified external cause status: Secondary | ICD-10-CM | POA: Insufficient documentation

## 2017-04-20 DIAGNOSIS — S1093XA Contusion of unspecified part of neck, initial encounter: Secondary | ICD-10-CM | POA: Insufficient documentation

## 2017-04-20 DIAGNOSIS — Y9389 Activity, other specified: Secondary | ICD-10-CM | POA: Insufficient documentation

## 2017-04-20 DIAGNOSIS — Y929 Unspecified place or not applicable: Secondary | ICD-10-CM | POA: Insufficient documentation

## 2017-04-20 DIAGNOSIS — F17200 Nicotine dependence, unspecified, uncomplicated: Secondary | ICD-10-CM | POA: Insufficient documentation

## 2017-04-20 DIAGNOSIS — W1789XA Other fall from one level to another, initial encounter: Secondary | ICD-10-CM | POA: Insufficient documentation

## 2017-04-20 MED ORDER — FENTANYL CITRATE (PF) 100 MCG/2ML IJ SOLN
INTRAMUSCULAR | Status: AC
  Filled 2017-04-20: qty 2

## 2017-04-20 MED ORDER — FENTANYL CITRATE (PF) 100 MCG/2ML IJ SOLN
50.0000 ug | INTRAMUSCULAR | Status: DC | PRN
  Administered 2017-04-20: 50 ug via NASAL

## 2017-04-20 NOTE — ED Triage Notes (Signed)
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 C-collar applied in triage.

## 2017-04-21 ENCOUNTER — Emergency Department (HOSPITAL_COMMUNITY)
Admission: EM | Admit: 2017-04-21 | Discharge: 2017-04-21 | Disposition: A | Discharge status: Home / Self Care | Payer: Self-pay | Attending: Emergency Medicine | Admitting: Emergency Medicine

## 2017-04-21 DIAGNOSIS — S1093XA Contusion of unspecified part of neck, initial encounter: Secondary | ICD-10-CM

## 2017-04-21 DIAGNOSIS — S161XXA Strain of muscle, fascia and tendon at neck level, initial encounter: Secondary | ICD-10-CM

## 2017-04-21 MED ORDER — CYCLOBENZAPRINE HCL 10 MG PO TABS: 10.0000 mg | ORAL_TABLET | Freq: Three times a day (TID) | ORAL | 0 refills | Status: DC | PRN

## 2017-04-21 MED ORDER — CYCLOBENZAPRINE HCL 10 MG PO TABS
10.0000 mg | ORAL_TABLET | Freq: Once | ORAL | Status: AC
  Administered 2017-04-21: 10 mg via ORAL
  Filled 2017-04-21: qty 1

## 2017-04-21 MED ORDER — OXYCODONE-ACETAMINOPHEN 5-325 MG PO TABS: 1.0000 | ORAL_TABLET | ORAL | 0 refills | Status: DC | PRN

## 2017-04-21 MED ORDER — OXYCODONE-ACETAMINOPHEN 5-325 MG PO TABS
1.0000 | ORAL_TABLET | Freq: Once | ORAL | Status: AC
  Administered 2017-04-21: 1 via ORAL
  Filled 2017-04-21: qty 1

## 2017-04-21 NOTE — ED Provider Notes (Signed)
[facility]-EMERGENCY DEPT Provider Note   CSN: [id number] Arrival date & time: 04/20/17  2231     History   Chief Complaint Chief Complaint  Patient presents with  . Neck Pain  . Fall    HPI Sara Bradshaw is a 26 y.o. female.  The history is provided by the patient.  She was sitting on the left gate of a truck when she fell and struck the back of her neck. She is complaining of severe pain in her neck. She denies weakness, numbness, tingling. Pain is rated at 10/10. She had received fentanyl in the ED while waiting for me to see here, and this did give temporary relief, but pain recurred.  Past Medical History:  Diagnosis Date  . Back pain, chronic   . Bipolar 1 disorder (HCC)   . Constipation   . Pneumonia   . Prolapsed uterus     There are no active problems to display for this patient.   Past Surgical History:  Procedure Laterality Date  . APPENDECTOMY    . TUBAL LIGATION    . WISDOM TOOTH EXTRACTION      OB History    Gravida Para Term Preterm AB Living   SAB TAB Ectopic Multiple Live Births   1       3       Home Medications    Prior to Admission medications   Medication Sig Start Date End Date Taking? Authorizing Provider  hydrocortisone (ANUSOL-HC) 2.5 % rectal cream Apply rectally 2 times daily Patient not taking: Reported on 04/21/2017 01/18/17   Muthersbaugh, Dahlia Client, PA-C  ibuprofen (ADVIL,MOTRIN) 800 MG tablet Take 1 tablet (800 mg total) by mouth 3 (three) times daily. Patient not taking: Reported on 04/21/2017 02/25/17   Garlon Hatchet, PA-C    Family History No family history on file.  Social History Social History  Substance Use Topics  . Smoking status: Current Every Day Smoker  . Smokeless tobacco: Never Used  . Alcohol use No     Allergies   Azithromycin; Ciprofloxacin; Morphine and related; Prednisone; and Penicillins   Review of Systems Review of Systems  All other systems reviewed and are  negative.    Physical Exam Updated Vital Signs BP 129/86 (BP Location: Right Arm)   Pulse 79   Temp 98.5 F (36.9 C) (Oral)   Resp 18   Ht  (1.702 m)   Wt 90.7 kg (200 lb)   LMP 07/10/2014   SpO2 100%   BMI 31.32 kg/m   Physical Exam  Nursing note and vitals reviewed.  26 year old female, resting comfortably and in no acute distress. Vital signs are normal. Oxygen saturation is 98%, which is normal. Head is normocephalic and atraumatic. PERRLA, EOMI. Oropharynx is clear. Neck is moderately tender diffusely. There is also tenderness and spasm over the paracervical muscles. There is no adenopathy or JVD. Back is nontender and there is no CVA tenderness. Lungs are clear without rales, wheezes, or rhonchi. Chest is nontender. Heart has regular rate and rhythm without murmur. Abdomen is soft, flat, nontender without masses or hepatosplenomegaly and peristalsis is normoactive. Extremities have no cyanosis or edema, full range of motion is present. Skin is warm and dry without rash. Neurologic: Mental status is normal, cranial nerves are intact, there are no motor or sensory deficits.  ED Treatments / Results   Radiology Ct Cervical Spine Wo Contrast  Result Date: 04/21/2017 CLINICAL DATA:  Patient fell backwards hitting a tail gate with her neck. Pain. Initial examination. EXAM: CT CERVICAL SPINE WITHOUT CONTRAST TECHNIQUE: Multidetector CT imaging of the cervical spine was performed without intravenous contrast. Multiplanar CT image reconstructions were also generated. COMPARISON:  08/27/2015 CT neck FINDINGS: Alignment: Normal. Skull base and vertebrae: No acute fracture. No primary bone lesion or focal pathologic process. Soft tissues and spinal canal: No prevertebral fluid or swelling. No visible canal hematoma. Disc levels: C2-C3: No focal disc herniation, neural foraminal encroachment or central canal stenosis. C3-C4: No focal disc herniation, neural foraminal encroachment  or central canal stenosis. C4-C5: No focal disc herniation, neural foraminal encroachment or central canal stenosis. C5-C6: Tiny central disc herniation, series 9, image 56. No significant central canal or neural foraminal encroachment. C6-C7: No focal disc herniation, neural foraminal encroachment or central stenosis. C7-T1:  Negative T1-T2: Negative Upper chest: Clear Other: None IMPRESSION: 1. No acute cervical spine fracture or posttraumatic subluxation. 2. Tiny central disc herniation at C5-6. Electronically Signed   By: Tollie Eth M.D.   On: 04/21/2017 00:16    Procedures Procedures (including critical care time)  Medications Ordered in ED Medications  fentaNYL (SUBLIMAZE) injection 50 mcg (50 mcg Nasal Given 04/20/17 2247)     Initial Impression / Assessment and Plan / ED Course  I have reviewed the triage vital signs and the nursing notes.  Pertinent imaging results that were available during my care of the patient were reviewed by me and considered in my medical decision making (see chart for details).  Contusion of the cervical spine with probable component of cervical strain. CT had been obtained prior to my seeing the patient and showed no acute injury. She states she feels better in the stiff cervical collar, so she is advised to use that as needed. Advised on using ice several times a day. She is discharged with prescriptions for oxycodone-acetaminophen and cyclobenzaprine.  Final Clinical Impressions(s) / ED Diagnoses   Final diagnoses:  Contusion of neck, initial encounter  Cervical strain, initial encounter    New Prescriptions New Prescriptions   CYCLOBENZAPRINE (FLEXERIL) 10 MG TABLET    Take 1 tablet (10 mg total) by mouth 3 (three) times daily as needed for muscle spasms.   OXYCODONE-ACETAMINOPHEN (PERCOCET) 5-325 MG TABLET    Take 1 tablet by mouth every 4 (four) hours as needed for moderate pain.     Dione Booze, MD 04/21/17 [id number]

## 2017-04-21 NOTE — Discharge Instructions (Signed)
Apply ice several times a day. Wear the collar as needed. Take ibuprofen or naproxen as needed for less severe pain. These can also work with the oxycodone-acetaminophen to give additional pain relief.

## 2017-05-12 ENCOUNTER — Encounter (HOSPITAL_COMMUNITY): Payer: Self-pay | Admitting: Emergency Medicine

## 2017-05-12 ENCOUNTER — Emergency Department (HOSPITAL_COMMUNITY)
Admission: EM | Admit: 2017-05-12 | Discharge: 2017-05-12 | Disposition: A | Discharge status: Home / Self Care | Payer: Self-pay | Attending: Emergency Medicine | Admitting: Emergency Medicine

## 2017-05-12 DIAGNOSIS — K047 Periapical abscess without sinus: Secondary | ICD-10-CM | POA: Insufficient documentation

## 2017-05-12 DIAGNOSIS — F17210 Nicotine dependence, cigarettes, uncomplicated: Secondary | ICD-10-CM | POA: Insufficient documentation

## 2017-05-12 DIAGNOSIS — K029 Dental caries, unspecified: Secondary | ICD-10-CM | POA: Insufficient documentation

## 2017-05-12 MED ORDER — NAPROXEN 250 MG PO TABS
500.0000 mg | ORAL_TABLET | Freq: Once | ORAL | Status: AC
  Administered 2017-05-12: 500 mg via ORAL
  Filled 2017-05-12: qty 2

## 2017-05-12 MED ORDER — NAPROXEN 500 MG PO TABS: 500.0000 mg | ORAL_TABLET | Freq: Two times a day (BID) | ORAL | 0 refills | Status: DC

## 2017-05-12 MED ORDER — CLINDAMYCIN HCL 150 MG PO CAPS
300.0000 mg | ORAL_CAPSULE | Freq: Once | ORAL | Status: AC
  Administered 2017-05-12: 300 mg via ORAL
  Filled 2017-05-12: qty 2

## 2017-05-12 MED ORDER — CLINDAMYCIN HCL 150 MG PO CAPS: 300.0000 mg | ORAL_CAPSULE | Freq: Three times a day (TID) | ORAL | 0 refills | Status: DC

## 2017-05-12 NOTE — ED Triage Notes (Signed)
Pt st's that all of her teeth are bad but tonight the back of her front lower tooth broke off.  Pt c/o pain

## 2017-05-12 NOTE — ED Notes (Signed)
Pt verbalized understanding or prescriptions, d/c instructions, and follow up. Pt ambulatory to lobby with significant other. VSS. NAD. Pt belongings taken by pt

## 2017-05-12 NOTE — ED Provider Notes (Signed)
[facility] EMERGENCY DEPARTMENT Provider Note   CSN: [id number] Arrival date & time: 05/12/17  2203     History   Chief Complaint Chief Complaint  Patient presents with  . Dental Pain    HPI Sara Bradshaw is a 26 y.o. female.  The history is provided by the patient and medical records.  Dental Pain      26 y.o. F with hx of chronic back pain and bipolar disorder, presenting to the ED for dental pain.  Patient reports he has "horrible teeth" because she does not have dental insurance and has not been able to see a dentist in several years.  States tonight she was eating and broke off part of her right lower molar.  States she has had increased pain and swelling of the gums.  No fever/chills.  Was not sure what to do so she came here. No meds tried PTA.  Past Medical History:  Diagnosis Date  . Back pain, chronic   . Bipolar 1 disorder (HCC)   . Constipation   . Pneumonia   . Prolapsed uterus     There are no active problems to display for this patient.   Past Surgical History:  Procedure Laterality Date  . APPENDECTOMY    . TUBAL LIGATION    . WISDOM TOOTH EXTRACTION      OB History    Gravida Para Term Preterm AB Living   SAB TAB Ectopic Multiple Live Births   1       3       Home Medications    Prior to Admission medications   Medication Sig Start Date End Date Taking? Authorizing Provider  cyclobenzaprine (FLEXERIL) 10 MG tablet Take 1 tablet (10 mg total) by mouth 3 (three) times daily as needed for muscle spasms. 04/21/17   Dione Booze, MD  oxyCODONE-acetaminophen (PERCOCET) 5-325 MG tablet Take 1 tablet by mouth every 4 (four) hours as needed for moderate pain. 04/21/17   Dione Booze, MD    Family History No family history on file.  Social History Social History  Substance Use Topics  . Smoking status: Current Every Day Smoker  . Smokeless tobacco: Never Used  . Alcohol use No     Allergies     Azithromycin; Ciprofloxacin; Morphine and related; Prednisone; and Penicillins   Review of Systems Review of Systems  HENT: Positive for dental problem.   All other systems reviewed and are negative.    Physical Exam Updated Vital Signs BP (!) 138/116 (BP Location: Right Arm)   Pulse (!) 109   Temp 98.2 F (36.8 C) (Oral)   Resp 16   LMP 07/10/2014   SpO2 100%   Physical Exam  Constitutional: She is oriented to person, place, and time. She appears well-developed and well-nourished.  HENT:  Head: Normocephalic and atraumatic.  Mouth/Throat: Oropharynx is clear and moist.  Teeth largely in very poor dentition, majority of teeth are broken/decayed, right lower molar is broken along posterior aspect, right upper left central incisor is also broken with exposed nerve, surrounding gingiva along right lower gums is swollen and tender to palpation, handling secretions appropriately, no trismus, no facial or neck swelling, normal phonation without stridor  Eyes: Pupils are equal, round, and reactive to light. Conjunctivae and EOM are normal.  Neck: Normal range of motion.  Cardiovascular: Normal rate, regular rhythm and normal heart sounds.   Pulmonary/Chest: Effort normal and breath sounds  normal. No respiratory distress. She has no wheezes.  Abdominal: Soft. Bowel sounds are normal. There is no tenderness. There is no rebound.  Musculoskeletal: Normal range of motion.  Neurological: She is alert and oriented to person, place, and time.  Skin: Skin is warm and dry.  Psychiatric: She has a normal mood and affect.  Nursing note and vitals reviewed.    ED Treatments / Results  Labs (all labs ordered are listed, but only abnormal results are displayed) Labs Reviewed - No data to display  EKG  EKG Interpretation None       Radiology No results found.  Procedures Procedures (including critical care time)  Medications Ordered in ED Medications  naproxen (NAPROSYN) tablet  500 mg (500 mg Oral Given 05/12/17 2355)  clindamycin (CLEOCIN) capsule 300 mg (300 mg Oral Given 05/12/17 2355)     Initial Impression / Assessment and Plan / ED Course  I have reviewed the triage vital signs and the nursing notes.  Pertinent labs & imaging results that were available during my care of the patient were reviewed by me and considered in my medical decision making (see chart for details).  26 y.o. F here with dental pain.  Tooth broke this evening when eating.  She is afebrile, non-toxic.  Dental pain with signs of dental decay and like developing infection.  No definitive abscess at this time.  No signs/symptoms concerning for ludwig's angina.  Will have her follow-up with dentist-- given resource guide for local clinics.  Start naprosyn, clindamycin.  Discussed plan with patient, she acknowledged understanding and agreed with plan of care.  Return precautions given for new or worsening symptoms.  Final Clinical Impressions(s) / ED Diagnoses   Final diagnoses:  Dental infection    New Prescriptions Discharge Medication List as of 05/12/2017 11:49 PM    START taking these medications   Details  clindamycin (CLEOCIN) 150 MG capsule Take 2 capsules (300 mg total) by mouth 3 (three) times daily. May dispense as  capsules, Starting Mon 05/12/2017, Print    naproxen (NAPROSYN) 500 MG tablet Take 1 tablet (500 mg total) by mouth 2 (two) times daily with a meal., Starting Mon 05/12/2017, Print         Garlon Hatchet, PA-C 05/13/17 0009    Benjiman Core, MD 05/13/17 [phone]

## 2017-05-12 NOTE — Discharge Instructions (Signed)
Take the prescribed medication as directed. Follow-up with one of the dental clinics as there is not a dentist on call today. Return to the ED for new or worsening symptoms.

## 2017-05-26 DIAGNOSIS — R0602 Shortness of breath: Secondary | ICD-10-CM | POA: Insufficient documentation

## 2017-05-26 DIAGNOSIS — J42 Unspecified chronic bronchitis: Secondary | ICD-10-CM | POA: Insufficient documentation

## 2017-05-26 DIAGNOSIS — R05 Cough: Secondary | ICD-10-CM | POA: Insufficient documentation

## 2017-05-26 DIAGNOSIS — R509 Fever, unspecified: Secondary | ICD-10-CM | POA: Insufficient documentation

## 2017-05-26 DIAGNOSIS — F17210 Nicotine dependence, cigarettes, uncomplicated: Secondary | ICD-10-CM | POA: Insufficient documentation

## 2017-05-26 NOTE — ED Triage Notes (Signed)
Pt arrives to ED via POV from home with c/o non-productive cough and congestion x2 weeks. Pt states "I get pneumonia 3 times a year and feel like it's time". Pt reports fever at home (101.0); denies N/V/D. No abdominal pain; RR even, regular, and unlabored; skin color/temp is WNL.

## 2017-05-27 ENCOUNTER — Emergency Department: Payer: Self-pay

## 2017-05-27 ENCOUNTER — Emergency Department
Admission: EM | Admit: 2017-05-27 | Discharge: 2017-05-27 | Disposition: A | Discharge status: Home / Self Care | Payer: Self-pay | Attending: Emergency Medicine | Admitting: Emergency Medicine

## 2017-05-27 DIAGNOSIS — F17200 Nicotine dependence, unspecified, uncomplicated: Secondary | ICD-10-CM

## 2017-05-27 DIAGNOSIS — J42 Unspecified chronic bronchitis: Secondary | ICD-10-CM

## 2017-05-27 MED ORDER — ALBUTEROL SULFATE HFA 108 (90 BASE) MCG/ACT IN AERS: INHALATION_SPRAY | RESPIRATORY_TRACT | 1 refills | Status: AC

## 2017-05-27 MED ORDER — HYDROCODONE-HOMATROPINE 5-1.5 MG/5ML PO SYRP
5.0000 mL | ORAL_SOLUTION | Freq: Four times a day (QID) | ORAL | 0 refills | Status: DC | PRN
Start: 2017-05-27 — End: 2018-01-06

## 2017-05-27 NOTE — Discharge Instructions (Signed)
You have been seen in the Emergency Department (ED) today for a likely viral illness.  However, your heavy smoking makes your symptoms worse and causes them to last longer.  Please consider cutting back on your smoking or trying to quit altogether.  Please drink plenty of clear fluids (water, Gatorade, chicken broth, etc).  You may use Tylenol and/or Motrin according to label instructions.  You can alternate between the two without any side effects.   Please follow up with your doctor as listed above.  Call your doctor or return to the Emergency Department (ED) if you are unable to tolerate fluids due to vomiting, have worsening trouble breathing, become extremely tired or difficult to awaken, or if you develop any other symptoms that concern you.

## 2017-05-27 NOTE — ED Provider Notes (Signed)
[facility] Emergency Department Provider Note  ____________________________________________   First MD Initiated Contact with Patient 05/27/17 0133     (approximate)  I have reviewed the triage vital signs and the nursing notes.   HISTORY  Chief Complaint Shortness of Breath and Fever    HPI Sara Bradshaw is a 26 y.o. female with medical history as listed below and a long history of heavy smoking (about 3 packs a day since age 87) who presents with complaints of general malaise, persistent and worsening cough, subjective fevers, and concern for pneumonia.  She states the symptoms have been going on for at least 2 weeks and are getting slightly worse.  She describes her symptoms as severe as states it is hard for her to sleep at night because of her cough.  Nothing makes her symptoms better.  They are worse with exertion.  She denies chest pain, nausea, vomiting, abdominal pain, and dysuria.  She has cut back her smoking but still smokes more than 2 packs a day.  He does not have insurance and does not have a doctor.  She has no medications at home that she takes for her cough.  She has no diagnosis of asthma or COPD.  Past Medical History:  Diagnosis Date  . Back pain, chronic   . Bipolar 1 disorder (HCC)   . Constipation   . Pneumonia   . Prolapsed uterus     There are no active problems to display for this patient.   Past Surgical History:  Procedure Laterality Date  . APPENDECTOMY    . TUBAL LIGATION    . WISDOM TOOTH EXTRACTION      Prior to Admission medications   Medication Sig Start Date End Date Taking? Authorizing Provider  albuterol (PROVENTIL HFA;VENTOLIN HFA) 108 (90 Base) MCG/ACT inhaler Inhale 2-4 puffs by mouth every 4 hours as needed for wheezing, cough, and/or shortness of breath 05/27/17   Loleta Rose, MD  clindamycin (CLEOCIN) 150 MG capsule Take 2 capsules (300 mg total) by mouth 3 (three) times daily. May dispense as  150mg  capsules 05/12/17   Garlon Hatchet, PA-C  cyclobenzaprine (FLEXERIL) 10 MG tablet Take 1 tablet (10 mg total) by mouth 3 (three) times daily as needed for muscle spasms. 04/21/17   Dione Booze, MD  HYDROcodone-homatropine [facility]) 5-1.5 MG/5ML syrup Take 5 mLs by mouth every 6 (six) hours as needed for cough. 05/27/17   Loleta Rose, MD  naproxen (NAPROSYN) 500 MG tablet Take 1 tablet (500 mg total) by mouth 2 (two) times daily with a meal. 05/12/17   Garlon Hatchet, PA-C  oxyCODONE-acetaminophen (PERCOCET) 5-325 MG tablet Take 1 tablet by mouth every 4 (four) hours as needed for moderate pain. 04/21/17   Dione Booze, MD    Allergies Azithromycin; Ciprofloxacin; Morphine and related; Prednisone; and Penicillins  No family history on file.  Social History Social History  Substance Use Topics  . Smoking status: Current Every Day Smoker  . Smokeless tobacco: Never Used  . Alcohol use No    Review of Systems Constitutional: Subjective fever/chills.  General malaise and body aches Eyes: No visual changes. ENT: No sore throat. Cardiovascular: Denies chest pain. Respiratory: Cough, shortness of breath. Gastrointestinal: No abdominal pain.  No nausea, no vomiting.  No diarrhea.  No constipation. Genitourinary: Negative for dysuria. Musculoskeletal: Negative for neck pain.  Negative for back pain. Integumentary: Negative for rash. Neurological: Negative for headaches, focal weakness or numbness.   ____________________________________________  PHYSICAL EXAM:  VITAL SIGNS: ED Triage Vitals  Enc Vitals Group     BP 05/26/17 2249 (!) 119/91     Pulse Rate 05/26/17 2249 97     Resp 05/26/17 2249 18     Temp 05/26/17 2249 98.7 F (37.1 C)     Temp Source 05/26/17 2249 Oral     SpO2 05/26/17 2249 98 %     Weight 05/26/17 2245 91.6 kg (202 lb)     Height 05/26/17 2245 1.702 m (5\' 7" )     Head Circumference --      Peak Flow --      Pain Score 05/26/17 2245 6     Pain  Loc --      Pain Edu? --      Excl. in GC? --     Constitutional: Alert and oriented. Well appearing and in no acute distress. Eyes: Conjunctivae are normal.  Head: Atraumatic. Nose: No congestion/rhinnorhea. Mouth/Throat: Mucous membranes are moist.  Poor dentition Neck: No stridor.  No meningeal signs.   Cardiovascular: Borderline tachycardia, regular rhythm. Good peripheral circulation. Grossly normal heart sounds. Respiratory: Normal respiratory effort.  No retractions.  Mild end expiratory wheezing throughout.  Occasional dry cough Gastrointestinal: Obese.  Soft and nontender. No distention.  Musculoskeletal: No lower extremity tenderness nor edema. No gross deformities of extremities. Neurologic:  Normal speech and language. No gross focal neurologic deficits are appreciated.  Skin:  Skin is warm, dry and intact. No rash noted. Psychiatric: Mood and affect are normal. Speech and behavior are normal.  ____________________________________________   LABS (all labs ordered are listed, but only abnormal results are displayed)  Labs Reviewed - No data to display ____________________________________________  EKG  None - EKG not ordered by ED physician ____________________________________________  RADIOLOGY Marylou MccoyI, Sulo Janczak, personally viewed and evaluated these images (plain radiographs) as part of my medical decision making, as well as reviewing the written report by the radiologist.  Dg Chest 2 View  Result Date: 05/27/2017 CLINICAL DATA:  Fever, congestion for 2 weeks. History of recurrent pneumonia. EXAM: CHEST  2 VIEW COMPARISON:  Chest radiograph July 02, 2015 FINDINGS: Cardiomediastinal silhouette is normal. No pleural effusions or focal consolidations. Mild bronchitic changes. Trachea projects midline and there is no pneumothorax. Soft tissue planes and included osseous structures are non-suspicious. IMPRESSION: Mild bronchitic changes without focal consolidation.  Electronically Signed   By: Awilda Metroourtnay  Bloomer M.D.   On: 05/27/2017 02:35    ____________________________________________   PROCEDURES  Critical Care performed: No   Procedure(s) performed:   Procedures   ____________________________________________   INITIAL IMPRESSION / ASSESSMENT AND PLAN / ED COURSE  As part of my medical decision making, I reviewed the following data within the electronic MEDICAL RECORD NUMBER Nursing notes reviewed and incorporated, Radiograph reviewed  and Notes from prior ED visits    Differential diagnosis includes community-acquired pneumonia, pneumothorax, acute/chronic bronchitis, nonspecific viral syndrome, influenza, etc.  However the patient has had symptoms for about 2 weeks and she has normal and stable/afebrile vitals today.  She has a mild expiratory wheeze which I suspect is secondary to her heavy smoking habit.  I counseled her about tobacco use cessation and explained to her that her chest x-ray is reassuring.  I counseled her to follow-up and establish primary care doctor particularly given that she has had 9 visits to the emergency department in the 6 months.  I will give her a prescription for an albuterol inhaler and cough medicine.  I gave  her my usual and customary return precautions.  She understands and agrees with the plan.  Clinical Course as of May 27 334  Tue May 27, 2017  0240 Reviewed the CXR and agree with the radiology report, no evidence of acute infection. DG Chest 2 View [CF]    Clinical Course User Index [CF] Loleta Rose, MD    ____________________________________________  FINAL CLINICAL IMPRESSION(S) / ED DIAGNOSES  Final diagnoses:  Chronic bronchitis, unspecified chronic bronchitis type (HCC)  Tobacco dependence     MEDICATIONS GIVEN DURING THIS VISIT:  Medications - No data to display   NEW OUTPATIENT MEDICATIONS STARTED DURING THIS VISIT:  Discharge Medication List as of 05/27/2017  3:00 AM    START  taking these medications   Details  albuterol (PROVENTIL HFA;VENTOLIN HFA) 108 (90 Base) MCG/ACT inhaler Inhale 2-4 puffs by mouth every 4 hours as needed for wheezing, cough, and/or shortness of breath, Print    HYDROcodone-homatropine (HYCODAN) 5-1.5 MG/5ML syrup Take 5 mLs by mouth every 6 (six) hours as needed for cough., Starting Tue 05/27/2017, Print        Discharge Medication List as of 05/27/2017  3:00 AM      Discharge Medication List as of 05/27/2017  3:00 AM       Note:  This document was prepared using Dragon voice recognition software and may include unintentional dictation errors.    Loleta Rose, MD 05/27/17 [phone]

## 2017-05-27 NOTE — ED Notes (Signed)

## 2017-06-01 DIAGNOSIS — Z5321 Procedure and treatment not carried out due to patient leaving prior to being seen by health care provider: Secondary | ICD-10-CM | POA: Insufficient documentation

## 2017-06-01 NOTE — ED Triage Notes (Signed)
Patient c/o left ankle pain. Patient denies injury - reports she was leaning to the side and felt strange sensation in left ankle. Patient reports pain radiates up leg and down foot.

## 2017-06-02 ENCOUNTER — Emergency Department
Admission: EM | Admit: 2017-06-02 | Discharge: 2017-06-02 | Disposition: A | Discharge status: Home / Self Care | Payer: Self-pay | Attending: Emergency Medicine | Admitting: Emergency Medicine

## 2017-06-02 ENCOUNTER — Emergency Department: Payer: Self-pay

## 2017-06-02 NOTE — ED Notes (Signed)
Several minutes ago pt's significant other came up to the desk to ask if it was okay to step outside for a minute; pt and visitor have not returned to waiting room; empty wheelchair was located outside front entrance;

## 2017-06-15 ENCOUNTER — Encounter: Payer: Self-pay | Admitting: Emergency Medicine

## 2017-06-15 ENCOUNTER — Emergency Department
Admission: EM | Admit: 2017-06-15 | Discharge: 2017-06-15 | Disposition: A | Discharge status: Home / Self Care | Payer: Self-pay | Attending: Emergency Medicine | Admitting: Emergency Medicine

## 2017-06-15 ENCOUNTER — Other Ambulatory Visit: Payer: Self-pay

## 2017-06-15 DIAGNOSIS — F17200 Nicotine dependence, unspecified, uncomplicated: Secondary | ICD-10-CM | POA: Insufficient documentation

## 2017-06-15 DIAGNOSIS — L237 Allergic contact dermatitis due to plants, except food: Secondary | ICD-10-CM | POA: Insufficient documentation

## 2017-06-15 DIAGNOSIS — T7840XA Allergy, unspecified, initial encounter: Secondary | ICD-10-CM | POA: Insufficient documentation

## 2017-06-15 DIAGNOSIS — Z79899 Other long term (current) drug therapy: Secondary | ICD-10-CM | POA: Insufficient documentation

## 2017-06-15 MED ORDER — ONDANSETRON HCL 4 MG/2ML IJ SOLN
4.0000 mg | Freq: Once | INTRAMUSCULAR | Status: AC
  Administered 2017-06-15: 4 mg via INTRAVENOUS

## 2017-06-15 MED ORDER — DIPHENHYDRAMINE HCL 25 MG PO TABS: 25.0000 mg | ORAL_TABLET | Freq: Four times a day (QID) | ORAL | 0 refills | Status: DC | PRN

## 2017-06-15 MED ORDER — IPRATROPIUM-ALBUTEROL 0.5-2.5 (3) MG/3ML IN SOLN
RESPIRATORY_TRACT | Status: AC
  Filled 2017-06-15: qty 9

## 2017-06-15 MED ORDER — METHYLPREDNISOLONE SODIUM SUCC 125 MG IJ SOLR
125.0000 mg | Freq: Once | INTRAMUSCULAR | Status: AC
Start: 2017-06-15 — End: 2017-06-15
  Administered 2017-06-15: 125 mg via INTRAVENOUS
  Filled 2017-06-15: qty 2

## 2017-06-15 MED ORDER — FAMOTIDINE IN NACL 20-0.9 MG/50ML-% IV SOLN
20.0000 mg | Freq: Once | INTRAVENOUS | Status: AC
  Administered 2017-06-15: 20 mg via INTRAVENOUS
  Filled 2017-06-15: qty 50

## 2017-06-15 MED ORDER — METHYLPREDNISOLONE SODIUM SUCC 125 MG IJ SOLR
INTRAMUSCULAR | Status: AC
  Filled 2017-06-15: qty 2

## 2017-06-15 MED ORDER — ONDANSETRON HCL 4 MG/2ML IJ SOLN
INTRAMUSCULAR | Status: AC
  Filled 2017-06-15: qty 2

## 2017-06-15 MED ORDER — FAMOTIDINE IN NACL 20-0.9 MG/50ML-% IV SOLN
INTRAVENOUS | Status: AC
  Filled 2017-06-15: qty 50

## 2017-06-15 MED ORDER — DIPHENHYDRAMINE HCL 50 MG/ML IJ SOLN
50.0000 mg | Freq: Once | INTRAMUSCULAR | Status: AC
  Administered 2017-06-15: 50 mg via INTRAVENOUS
  Filled 2017-06-15: qty 1

## 2017-06-15 MED ORDER — PREDNISONE 20 MG PO TABS: 40.0000 mg | ORAL_TABLET | Freq: Every day | ORAL | 0 refills | Status: DC

## 2017-06-15 NOTE — ED Provider Notes (Signed)
[facility] Emergency Department Provider Note  ____________________________________________  Time seen: Approximately 9:21 PM  I have reviewed the triage vital signs and the nursing notes.   HISTORY  Chief Complaint Allergic Reaction    HPI Sara Bradshaw is a 26 y.o. female reports that today about 6 PM she started having a rash over the face and the left wrist. 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 She recently change the bedding that she uses for her [place] pigs to a type of hay that she has had reactions to in the past. 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 Now, she was examining her [place] pigs who were running around in the say and living in it and likely covered in its dust, and subsequently developed itching eyes and erythema all over her face and a small spot in the left wrist where the [place] pig was held as well. When she finished transporting the [place] her buyer [place],her symptoms were so worsening so she came to the ED. Denies chest pain or shortness of breath. No dizziness or syncope. No vomiting. No history of anaphylaxis. Symptoms are constant, moderate intensity, no aggravating or alleviating factors.     Past Medical History:  Diagnosis Date  . Back pain, chronic   . Bipolar 1 disorder (HCC)   . Constipation   . Pneumonia   . Prolapsed uterus      There are no active problems to display for this patient.    Past Surgical History:  Procedure Laterality Date  . APPENDECTOMY    . TUBAL LIGATION    . WISDOM TOOTH EXTRACTION       Prior to Admission medications   Medication Sig Start Date End Date Taking? Authorizing Provider  albuterol (PROVENTIL HFA;VENTOLIN HFA) 108 (90 Base) MCG/ACT inhaler Inhale 2-4 puffs by mouth every 4 hours as needed for wheezing, cough, and/or  shortness of breath 05/27/17   Loleta Rose, MD  clindamycin (CLEOCIN) 150 MG capsule Take 2 capsules (300 mg total) by mouth 3 (three) times daily. May dispense as 150mg  capsules 05/12/17   Garlon Hatchet, PA-C  cyclobenzaprine (FLEXERIL) 10 MG tablet Take 1 tablet (10 mg total) by mouth 3 (three) times daily as needed for muscle spasms. 04/21/17   Dione Booze, MD  diphenhydrAMINE (BENADRYL) 25 MG tablet Take 1 tablet (25 mg total) every 6 (six) hours as needed by mouth. 06/15/17   Sharman Cheek, MD  HYDROcodone-homatropine [facility]) 5-1.5 MG/5ML syrup Take 5 mLs by mouth every 6 (six) hours as needed for cough. 05/27/17   Loleta Rose, MD  naproxen (NAPROSYN) 500 MG tablet Take 1 tablet (500 mg total) by mouth 2 (two) times daily with a meal. 05/12/17   Garlon Hatchet, PA-C  oxyCODONE-acetaminophen (PERCOCET) 5-325 MG tablet Take 1 tablet by mouth every 4 (four) hours as needed for moderate pain. 04/21/17   Dione Booze, MD  predniSONE (DELTASONE) 20 MG tablet Take 2 tablets (40 mg total) daily by mouth. 06/15/17   Sharman Cheek, MD     Allergies Azithromycin; Ciprofloxacin; Morphine and related; Prednisone; and Penicillins   History reviewed. No pertinent family history.  Social History Social History   Tobacco Use  . Smoking status: Current Every Day Smoker  . Smokeless tobacco: Never Used  Substance Use Topics  .  Alcohol use: No  . Drug use: No    Review of Systems  Constitutional:   No fever or chills.  ENT:   No sore throat. No rhinorrhea. Cardiovascular:   No chest pain or syncope. Respiratory:   No dyspnea or cough. Gastrointestinal:   Negative for abdominal pain, vomiting and diarrhea.  Musculoskeletal:   Negative for focal pain or swelling All other systems reviewed and are negative except as documented above in ROS and HPI.  ____________________________________________   PHYSICAL EXAM:  VITAL SIGNS: ED Triage Vitals  Enc Vitals Group     BP  06/15/17 1844 (!) 149/99     Pulse Rate 06/15/17 1844 (!) 103     Resp 06/15/17 1844 (!) 22     Temp 06/15/17 1844 98.9 F (37.2 C)     Temp Source 06/15/17 1844 Oral     SpO2 06/15/17 1844 100 %     Weight 06/15/17 1845 200 lb (90.7 kg)     Height 06/15/17 1845 5\' 7"  (1.702 m)     Head Circumference --      Peak Flow --      Pain Score 06/15/17 1855 0     Pain Loc --      Pain Edu? --      Excl. in GC? --     Vital signs reviewed, nursing assessments reviewed.   Constitutional:   Alert and oriented. Well appearing and in no distress. Eyes:   No scleral icterus.  EOMI. No nystagmus. No conjunctival pallor. PERRL. ENT   Head:   Normocephalic and atraumatic. There is a diffuse erythema across the face with some mild soft tissue swelling and is a conjunctivitis as well, all consistent with allergic reaction, likely actually contact dermatitis   Nose:   No congestion/rhinnorhea.    Mouth/Throat:   MMM, no pharyngeal erythema. No peritonsillar mass. No mucosal lesions   Neck:   No meningismus. Full ROM. Hematological/Lymphatic/Immunilogical:   No cervical lymphadenopathy. Cardiovascular:   RRR. Symmetric bilateral radial and DP pulses.  No murmurs.  Respiratory:   Normal respiratory effort without tachypnea/retractions. Breath sounds are clear and equal bilaterally. No wheezes/rales/rhonchi. No inducible wheezing with FEV1 maneuver. Gastrointestinal:   Soft and nontender. Non distended. There is no CVA tenderness.  No rebound, rigidity, or guarding. Genitourinary:   deferred Musculoskeletal:   Normal range of motion in all extremities. No joint effusions.  No lower extremity tenderness.  No edema. Neurologic:   Normal speech and language.  Motor grossly intact. No gross focal neurologic deficits are appreciated.  Skin:    Skin is warm, dry and intact. Facial rash as above. Small 2 cm area of erythema over the dorsal left wrist. No diffuse  urticaria.  ____________________________________________    LABS (pertinent positives/negatives) (all labs ordered are listed, but only abnormal results are displayed) Labs Reviewed - No data to display ____________________________________________   EKG    ____________________________________________    RADIOLOGY  No results found.  ____________________________________________   PROCEDURES Procedures  ____________________________________________     CLINICAL IMPRESSION / ASSESSMENT AND PLAN / ED COURSE  Pertinent labs & imaging results that were available during my care of the patient were reviewed by me and considered in my medical decision making (see chart for details).   Patient well-appearing no acute distress, presents with an allergic reaction which actually appears to be contact dermatitis likely due to exposure to this headache dust from her [place] pig. Presentation not consistent with anaphylaxis. Patient given  antihistamines and steroids and feels much better and is tolerating oral intake. I'll discharge her home, continue antihistamines and steroids for a couple of days, follow up with primary care. Counseled on the likely diagnosis and the need to avoid this headache in the future where possible. 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      ____________________________________________   FINAL CLINICAL IMPRESSION(S) / ED DIAGNOSES    Final diagnoses:  Allergic reaction, initial encounter  Allergic contact dermatitis due to plants, except food      This SmartLink is deprecated. Use AVSMEDLIST instead to display the medication list for a patient.   Portions of this note were generated with dragon dictation software. Dictation errors may occur despite best attempts at proofreading.    Sharman CheekStafford, Tarus Briski, MD 06/15/17 2127

## 2017-06-15 NOTE — ED Notes (Signed)
Pt reports improvement of symptoms.

## 2017-06-15 NOTE — ED Triage Notes (Addendum)
Pt c/o itching all over in eyes and forehead, lips and states her throat is getting scratchy.  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 Pt states they tried to "sex" a [place] pig and it sprayed a substance which she thinks made the reaction.  No distress noted at this time.

## 2017-06-15 NOTE — ED Notes (Signed)
Family at bedside. 

## 2017-06-15 NOTE — ED Notes (Signed)
Pt reports feeling better with original symptoms but has some nausea. New order received.

## 2017-08-03 ENCOUNTER — Encounter (HOSPITAL_COMMUNITY): Payer: Self-pay

## 2017-08-03 ENCOUNTER — Emergency Department (HOSPITAL_COMMUNITY)
Admission: EM | Admit: 2017-08-03 | Discharge: 2017-08-04 | Disposition: A | Discharge status: Home / Self Care | Payer: Self-pay | Attending: Emergency Medicine | Admitting: Emergency Medicine

## 2017-08-03 ENCOUNTER — Other Ambulatory Visit: Payer: Self-pay

## 2017-08-03 ENCOUNTER — Emergency Department (HOSPITAL_COMMUNITY): Payer: Self-pay

## 2017-08-03 DIAGNOSIS — F1510 Other stimulant abuse, uncomplicated: Secondary | ICD-10-CM | POA: Diagnosis present

## 2017-08-03 DIAGNOSIS — J209 Acute bronchitis, unspecified: Secondary | ICD-10-CM | POA: Insufficient documentation

## 2017-08-03 DIAGNOSIS — R45851 Suicidal ideations: Secondary | ICD-10-CM

## 2017-08-03 DIAGNOSIS — F4310 Post-traumatic stress disorder, unspecified: Secondary | ICD-10-CM | POA: Diagnosis present

## 2017-08-03 DIAGNOSIS — F319 Bipolar disorder, unspecified: Secondary | ICD-10-CM | POA: Insufficient documentation

## 2017-08-03 DIAGNOSIS — F17200 Nicotine dependence, unspecified, uncomplicated: Secondary | ICD-10-CM | POA: Insufficient documentation

## 2017-08-03 LAB — URINALYSIS, ROUTINE W REFLEX MICROSCOPIC
BILIRUBIN URINE: NEGATIVE
Glucose, UA: NEGATIVE mg/dL
HGB URINE DIPSTICK: NEGATIVE
Ketones, ur: NEGATIVE mg/dL
Leukocytes, UA: NEGATIVE
Nitrite: NEGATIVE
PROTEIN: NEGATIVE mg/dL
SPECIFIC GRAVITY, URINE: 1.001 — AB (ref 1.005–1.030)
pH: 7 (ref 5.0–8.0)

## 2017-08-03 LAB — COMPREHENSIVE METABOLIC PANEL
ALBUMIN: 3.7 g/dL (ref 3.5–5.0)
ALK PHOS: 51 U/L (ref 38–126)
ALT: 26 U/L (ref 14–54)
AST: 22 U/L (ref 15–41)
Anion gap: 6 (ref 5–15)
BILIRUBIN TOTAL: 0.5 mg/dL (ref 0.3–1.2)
CALCIUM: 8.6 mg/dL — AB (ref 8.9–10.3)
CO2: 27 mmol/L (ref 22–32)
Chloride: 105 mmol/L (ref 101–111)
Creatinine, Ser: 0.8 mg/dL (ref 0.44–1.00)
GFR calc Af Amer: >60 mL/min (ref >60)
GFR calc non Af Amer: >60 mL/min (ref >60)
GLUCOSE: 131 mg/dL — AB (ref 65–99)
Potassium: 2.9 mmol/L — ABNORMAL LOW (ref 3.5–5.1)
SODIUM: 138 mmol/L (ref 135–145)
TOTAL PROTEIN: 7.3 g/dL (ref 6.5–8.1)

## 2017-08-03 LAB — I-STAT BETA HCG BLOOD, ED (MC, WL, AP ONLY): I-stat hCG, quantitative: <5 m[IU]/mL (ref <5)

## 2017-08-03 LAB — RAPID URINE DRUG SCREEN, HOSP PERFORMED
Amphetamines: POSITIVE — AB
BARBITURATES: NOT DETECTED
Benzodiazepines: NOT DETECTED
Cocaine: NOT DETECTED
Opiates: NOT DETECTED
TETRAHYDROCANNABINOL: NOT DETECTED

## 2017-08-03 LAB — CBC
HCT: 37.7 % (ref 36.0–46.0)
HEMOGLOBIN: 13 g/dL (ref 12.0–15.0)
MCH: 31.6 pg (ref 26.0–34.0)
MCHC: 34.5 g/dL (ref 30.0–36.0)
MCV: 91.5 fL (ref 78.0–100.0)
Platelets: 211 10*3/uL (ref 150–400)
RBC: 4.12 MIL/uL (ref 3.87–5.11)
RDW: 11.4 % — ABNORMAL LOW (ref 11.5–15.5)
WBC: 4.5 10*3/uL (ref 4.0–10.5)

## 2017-08-03 LAB — SALICYLATE LEVEL: Salicylate Lvl: <7 mg/dL (ref 2.8–30.0)

## 2017-08-03 LAB — ETHANOL: Alcohol, Ethyl (B): <10 mg/dL (ref <10)

## 2017-08-03 LAB — ACETAMINOPHEN LEVEL

## 2017-08-03 MED ORDER — SODIUM CHLORIDE 0.9 % IV SOLN
1000.0000 mL | INTRAVENOUS | Status: DC
  Administered 2017-08-03: 1000 mL via INTRAVENOUS

## 2017-08-03 MED ORDER — NICOTINE 21 MG/24HR TD PT24
21.0000 mg | MEDICATED_PATCH | Freq: Every day | TRANSDERMAL | Status: DC
  Administered 2017-08-03 – 2017-08-04 (×2): 21 mg via TRANSDERMAL
  Filled 2017-08-03 (×2): qty 1

## 2017-08-03 MED ORDER — POTASSIUM CHLORIDE CRYS ER 20 MEQ PO TBCR
20.0000 meq | EXTENDED_RELEASE_TABLET | Freq: Once | ORAL | Status: AC
  Administered 2017-08-03: 20 meq via ORAL
  Filled 2017-08-03: qty 1

## 2017-08-03 MED ORDER — IPRATROPIUM-ALBUTEROL 0.5-2.5 (3) MG/3ML IN SOLN: 3.0000 mL | RESPIRATORY_TRACT | Status: DC | PRN

## 2017-08-03 MED ORDER — POTASSIUM CHLORIDE CRYS ER 20 MEQ PO TBCR
40.0000 meq | EXTENDED_RELEASE_TABLET | Freq: Once | ORAL | Status: AC
  Administered 2017-08-03: 40 meq via ORAL
  Filled 2017-08-03: qty 2

## 2017-08-03 MED ORDER — SODIUM CHLORIDE 0.9 % IV SOLN
Freq: Once | INTRAVENOUS | Status: AC
  Administered 2017-08-03: 13:00:00 via INTRAVENOUS

## 2017-08-03 MED ORDER — SODIUM CHLORIDE 0.9 % IV BOLUS (SEPSIS)
1000.0000 mL | Freq: Once | INTRAVENOUS | Status: AC
  Administered 2017-08-03: 1000 mL via INTRAVENOUS

## 2017-08-03 MED ORDER — POTASSIUM CHLORIDE CRYS ER 20 MEQ PO TBCR: 20.0000 meq | EXTENDED_RELEASE_TABLET | Freq: Every day | ORAL | 0 refills | Status: DC

## 2017-08-03 MED ORDER — LORAZEPAM 1 MG PO TABS: 1.0000 mg | ORAL_TABLET | Freq: Four times a day (QID) | ORAL | Status: DC | PRN

## 2017-08-03 MED ORDER — ALBUTEROL SULFATE HFA 108 (90 BASE) MCG/ACT IN AERS
2.0000 | INHALATION_SPRAY | RESPIRATORY_TRACT | Status: DC
  Administered 2017-08-03 – 2017-08-04 (×3): 2 via RESPIRATORY_TRACT
  Filled 2017-08-03: qty 6.7

## 2017-08-03 MED ORDER — IPRATROPIUM-ALBUTEROL 0.5-2.5 (3) MG/3ML IN SOLN
3.0000 mL | Freq: Once | RESPIRATORY_TRACT | Status: AC
  Administered 2017-08-03: 3 mL via RESPIRATORY_TRACT
  Filled 2017-08-03: qty 3

## 2017-08-03 NOTE — Patient Outreach (Signed)
ED Peer Support Specialist Patient Intake (Complete at intake & 30-60 Day Follow-up)  Name: Sara Bradshaw  MRN: [id number]  Age: 27 y.o.   Date of Admission: 08/03/2017  Intake: Initial Comments:      Primary Reason Admitted: SI with plan to OD on heroin, drug overdose, poly substance use with MDMA and heroin Lab values: Alcohol/ETOH: Negative Positive UDS? Yes Amphetamines: Yes Barbiturates: No Benzodiazepines: No Cocaine: No Opiates: No Cannabinoids: No  Demographic information: Gender: Female Ethnicity: White Marital Status: Divorced Insurance Status: Uninsured/Self-pay Control and instrumentation engineereceives non-medical governmental assistance (Work Engineer, agriculturalirst/Welfare, Sales executivefood stamps, etc.: No Lives with: Alone Living situation: Homeless  Reported Patient History: Patient reported health conditions: Depression, Other (comment), Bipolar disorder Patient aware of HIV and hepatitis status: No  In past year, has patient visited ED for any reason? No  Number of ED visits:    Reason(s) for visit:    In past year, has patient been hospitalized for any reason? No  Number of hospitalizations:    Reason(s) for hospitalization:    In past year, has patient been arrested? No  Number of arrests:    Reason(s) for arrest:    In past year, has patient been incarcerated? No  Number of incarcerations:    Reason(s) for incarceration:    In past year, has patient received medication-assisted treatment? No  In past year, patient received the following treatments:    In past year, has patient received any harm reduction services? No  Did this include any of the following?    In past year, has patient received care from a mental health provider for diagnosis other than SUD? No  In past year, is this first time patient has overdosed? No(Heroin)  Number of past overdoses: 1  In past year, is this first time patient has been hospitalized for an overdose? Yes  Number of hospitalizations for overdose(s):     Is patient currently receiving treatment for a mental health diagnosis? No  Patient reports experiencing difficulty participating in SUD treatment: No    Most important reason(s) for this difficulty?    Has patient received prior services for treatment? No  In past, patient has received services from following agencies:    Plan of Care:  Suggested follow up at these agencies/treatment centers: ([facility] plans to follow up with the patient in the morning. [facility] Arlys JohnBrian will follow up with the patient. Patient is interested in inpatient mental health/substance use treatment.  )  Other information: [facility] Arlys JohnBrian will follow up with the patient in the morning at 11:00 am. [facility] also provided [facility] contact information and encouraged the patient to contact the patient at any time for substance use treatment.    Bartholomew BoardsJohn Raychel Dowler, [facility]  08/03/2017 7:10 PM

## 2017-08-03 NOTE — ED Provider Notes (Addendum)
[facility]-EMERGENCY DEPT Provider Note   CSN: [id number] Arrival date & time: 08/03/17  1200     History   Chief Complaint Chief Complaint  Patient presents with  . Drug Overdose    HPI Sara Bradshaw is a 27 y.o. female.  HPI Reports that she has been using drugs now for a while.  Reports she had been clean but then when she and her husband got divorced she went back to using again.  She reports her drug of choice has been stimulants.  She has been injecting Molly into the left antecubital fossa.  She has been staying in a hotel for about a week with some people with whom she is using drugs.  She reports she was feeling fine but injected today and just got this burning feeling that came all up through her arm through her chest and into her head.  It immediately felt wrong.  She called EMS.  She also let her mother know was going on.  She reports she feels better now.  She reports she still has burning in her back but reports that she has had a cough now for about a week and it feels congested but she cannot get anything out.  She reports she had a fever up to 102.  No shortness of breath or anterior chest pain with it.  No lower extremity swelling or calf pain.  No vomiting or diarrhea.  Reports she does smoke as well.  She reports she has asthma but has not had an inhaler for a while.  She reports that she just feels like she is worthless.  She reports that she does not want to try anymore and has no motivation.  She reports it would be fine if she just died but does not have a plan for killing herself.     Past Medical History:     Past Medical History:  Diagnosis Date  . Back pain, chronic   . Bipolar 1 disorder (HCC)   . Constipation   . Pneumonia   . Prolapsed uterus     There are no active problems to display for this patient.   Past Surgical History:  Procedure Laterality Date  . APPENDECTOMY    . TUBAL LIGATION    . WISDOM TOOTH  EXTRACTION      OB History    Gravida Para Term Preterm AB Living   4 3 2 1 1 3    SAB TAB Ectopic Multiple Live Births   1       3       Home Medications    Prior to Admission medications   Medication Sig Start Date End Date Taking? Authorizing Provider  albuterol (PROVENTIL HFA;VENTOLIN HFA) 108 (90 Base) MCG/ACT inhaler Inhale 2-4 puffs by mouth every 4 hours as needed for wheezing, cough, and/or shortness of breath Patient not taking: Reported on 08/03/2017 05/27/17   Loleta Rose, MD  clindamycin (CLEOCIN) 150 MG capsule Take 2 capsules (300 mg total) by mouth 3 (three) times daily. May dispense as 150mg  capsules Patient not taking: Reported on 08/03/2017 05/12/17   Garlon Hatchet, PA-C  cyclobenzaprine (FLEXERIL) 10 MG tablet Take 1 tablet (10 mg total) by mouth 3 (three) times daily as needed for muscle spasms. Patient not taking: Reported on 08/03/2017 04/21/17   Dione Booze, MD  diphenhydrAMINE (BENADRYL) 25 MG tablet Take 1 tablet (25 mg total) every 6 (six) hours as needed by mouth. Patient not taking: Reported  on 08/03/2017 06/15/17   Sharman Cheek, MD  HYDROcodone-homatropine [facility]) 5-1.5 MG/5ML syrup Take 5 mLs by mouth every 6 (six) hours as needed for cough. Patient not taking: Reported on 08/03/2017 05/27/17   Loleta Rose, MD  naproxen (NAPROSYN) 500 MG tablet Take 1 tablet (500 mg total) by mouth 2 (two) times daily with a meal. Patient not taking: Reported on 08/03/2017 05/12/17   Garlon Hatchet, PA-C  oxyCODONE-acetaminophen (PERCOCET) 5-325 MG tablet Take 1 tablet by mouth every 4 (four) hours as needed for moderate pain. Patient not taking: Reported on 08/03/2017 04/21/17   Dione Booze, MD  predniSONE (DELTASONE) 20 MG tablet Take 2 tablets (40 mg total) daily by mouth. Patient not taking: Reported on 08/03/2017 06/15/17   Sharman Cheek, MD    Family History No family history on file.  Social History Social History   Tobacco Use  . Smoking status: Current  Every Day Smoker  . Smokeless tobacco: Never Used  Substance Use Topics  . Alcohol use: No  . Drug use: Yes    Types: Methamphetamines    Comment: Molly      Allergies   Azithromycin; Ciprofloxacin; Morphine and related; Prednisone; and Penicillins   Review of Systems Review of Systems 10 Systems reviewed and are negative for acute change except as noted in the HPI.   Physical Exam Updated Vital Signs BP (!) 127/91 (BP Location: Right Arm)   Pulse (!) 120   Temp 98.3 F (36.8 C)   Resp 18   Ht 5\' 7"  (1.702 m)   Wt 90.7 kg (200 lb)   LMP 07/10/2014   SpO2 97%   BMI 31.32 kg/m   Physical Exam  Physical Exam Updated Vital Signs BP (!) 127/91 (BP Location: Right Arm)   Pulse (!) 120   Temp 98.3 F (36.8 C)   Resp 18   Ht 5\' 7"  (1.702 m)   Wt 90.7 kg (200 lb)   LMP 07/10/2014   SpO2 97%   BMI 31.32 kg/m   Physical Exam  Constitutional: She is oriented to person, place, and time. She appears well-developed and well-nourished. No distress.  HENT:  Head: Normocephalic and atraumatic.  Right Ear: External ear normal.  Left Ear: External ear normal.  Mouth/Throat: Oropharynx is clear and moist.  Eyes: Conjunctivae and EOM are normal. Pupils are equal, round, and reactive to light.  Neck: Neck supple.  Cardiovascular: Regular rhythm, normal heart sounds and intact distal pulses.  No murmur heard. Mild tachycardia.  Pulmonary/Chest: Effort normal. No respiratory distress.  Occasional harsh cough with deep inspiration.  Focal area of wheeze right lung field.  Abdominal: Soft. She exhibits no distension. There is no tenderness. There is no guarding.  Musculoskeletal: Normal range of motion. She exhibits no edema or tenderness.  Extremities are good condition.  Patient injects into the left AC fossa.  No erythema or swelling.  No track marks.  Neurological: She is alert and oriented to person, place, and time. No cranial nerve deficit. She exhibits normal muscle  tone. Coordination normal.  Skin: Skin is warm and dry.  Psychiatric: She has a normal mood and affect.  Nursing note and vitals reviewed.   ED Treatments / Results  Labs (all labs ordered are listed, but only abnormal results are displayed) Labs Reviewed  COMPREHENSIVE METABOLIC PANEL - Abnormal; Notable for the following components:      Result Value   Potassium 2.9 (*)    Glucose, Bld 131 (*)  BUN <5 (*)    Calcium 8.6 (*)    All other components within normal limits  ACETAMINOPHEN LEVEL - Abnormal; Notable for the following components:   Acetaminophen (Tylenol), Serum <10 (*)    All other components within normal limits  CBC - Abnormal; Notable for the following components:   RDW 11.4 (*)    All other components within normal limits  RAPID URINE DRUG SCREEN, HOSP PERFORMED - Abnormal; Notable for the following components:   Amphetamines POSITIVE (*)    All other components within normal limits  URINALYSIS, ROUTINE W REFLEX MICROSCOPIC - Abnormal; Notable for the following components:   Color, Urine STRAW (*)    Specific Gravity, Urine 1.001 (*)    All other components within normal limits  ETHANOL  SALICYLATE LEVEL  I-STAT BETA HCG BLOOD, ED ([facility], [facility], [facility] ONLY)  CBG MONITORING, ED    EKG  EKG Interpretation None       Radiology Dg Chest 2 View  Result Date: 08/03/2017 CLINICAL DATA:  Chest pain and shortness of breath EXAM: CHEST  2 VIEW COMPARISON:  May 27, 2017 FINDINGS: The heart size and mediastinal contours are within normal limits. Both lungs are clear. The visualized skeletal structures are unremarkable. IMPRESSION: No active cardiopulmonary disease. Electronically Signed   By: Gerome Sam III M.D   On: 08/03/2017 16:18    Procedures Procedures (including critical care time)  Medications Ordered in ED Medications  sodium chloride 0.9 % bolus 1,000 mL (1,000 mLs Intravenous New Bag/Given 08/03/17 1554)    Followed by  0.9 %  sodium chloride  infusion (not administered)  ipratropium-albuterol (DUONEB) 0.5-2.5 (3) MG/3ML nebulizer solution 3 mL (not administered)  0.9 %  sodium chloride infusion ( Intravenous New Bag/Given 08/03/17 1230)  potassium chloride SA (K-DUR,KLOR-CON) CR tablet 40 mEq (40 mEq Oral Given 08/03/17 1549)     Initial Impression / Assessment and Plan / ED Course  I have reviewed the triage vital signs and the nursing notes.  Pertinent labs & imaging results that were available during my care of the patient were reviewed by me and considered in my medical decision making (see chart for details).      Final Clinical Impressions(s) / ED Diagnoses   Final diagnoses:  Stimulant abuse (HCC)  Acute bronchitis, unspecified organism   Patient presents as outlined above with adverse effect after injecting stimulant (Molly\MDMA).  Patient identified diffuse burning throughout her arms chest and top of her head just after injecting.  Those symptoms have resolved.  The patient is alert and appropriate.  She is not showing signs of distress.  Diagnostic workup is within normal limits.  Patient does describe about a week of chest congestion and cough with fever.  Chest x-ray does not show focal infiltrate.  She does have wheeze and cough on exam.  She denies known asthmatic and smokes.  This appears most consistent with acute bronchitis.  Recommendation will be for continuing outpatient inhalers and smoking cessation.  Patient is also moderately hypokalemic.  40 mEq replaced in the emergency department plan will be for another 20 mEq 4 hours after first dose then 20 mEq daily for the next 7 days with recheck with PCP.  Patient will be rehydrated with IV fluids.  Patient does request consultation with TTS.  She reports she is having feelings of hopelessness and worthlessness feeling that life is not worth living.  She does not express a specific plan for killing herself.  At this time I  do not feel that IVC is warranted.  Plan will  be for final  reassessment by Dr. Effie ShyWentz prior to discharge.  If patient is found to remain stable in terms of general appearance and condition, as well as cleared by psychiatry for outpatient management, plan will be for dispensing albuterol inhaler with spacer teaching.  And follow-up with a PCP as well as counselors. ED Discharge Orders    None       Arby BarrettePfeiffer, Carolee Channell, MD 08/03/17 1636    Arby BarrettePfeiffer, Bradd Merlos, MD 08/03/17 [phone]

## 2017-08-03 NOTE — ED Notes (Signed)
Bed: [id number] Expected date:  Expected time:  Means of arrival:  Comments: Rm 15

## 2017-08-03 NOTE — ED Notes (Signed)
Peer support at bedside 

## 2017-08-03 NOTE — ED Notes (Signed)
Pt ambulated to rest room without difficulty, upon returning to room noted to be tearful but did not want any support.

## 2017-08-03 NOTE — ED Notes (Signed)
Pt admitted to room #42. Pt tearful on approach. "I'm going through a lot of stuff." Pt reports "I lost all of my kids and husband." Pt reports she lost custody of her children and has not had contact for 8 months. Pt endorsing passive SI, verbally contracts for safety. Pt denies HI/AVH. 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 Encouragement and support provided. Special checks q 15 mins in place for safety, Video monitoring in place. Will continue to monitor.
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
## 2017-08-03 NOTE — ED Provider Notes (Addendum)
16:55- Alert, tearful.  She is texting on her phone at this time.  She reports to me that she is upset her husband having her children, in generally, for the last several years.  She has since married another person, and uses drugs of various types on.  She states she has not eaten much in the last 5 days but is not currently hungry.  She is not employed.  She reports having thoughts of not wanting to live but no active suicidal plan at this time.  The patient's medical condition is being treated.  TTS has been consulted.  She will likely be able to be discharged with outpatient therapy and medical management.       Mancel BaleWentz, Keyera Hattabaugh, MD 08/03/17 1708  Consideration of medical clearance: BP129/84, HR 87, at this time. Patient is resting comfortably and has no additional complaints.  At this time the patient is medically cleared for treatment by psychiatry.    Mancel BaleWentz, Ady Heimann, MD 08/03/17 [phone]

## 2017-08-03 NOTE — ED Notes (Signed)
Pt remains tearful at intervals, trying to talk with mother. Pt felt as if she was withdrawing from cigarettes. Nicotine patch applied.

## 2017-08-03 NOTE — ED Notes (Signed)
SBAR Report received from previous nurse. Pt received calm and visible on unit. Pt denies current SI/ HI, A/V H, depression, anxiety, or pain at this time, and appears otherwise stable and free of distress. Pt reminded of camera surveillance, q 15 min rounds, and rules of the milieu. Will continue to assess. 
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
## 2017-08-04 DIAGNOSIS — F4310 Post-traumatic stress disorder, unspecified: Secondary | ICD-10-CM

## 2017-08-04 DIAGNOSIS — R45851 Suicidal ideations: Secondary | ICD-10-CM

## 2017-08-04 DIAGNOSIS — F17200 Nicotine dependence, unspecified, uncomplicated: Secondary | ICD-10-CM

## 2017-08-04 DIAGNOSIS — F1510 Other stimulant abuse, uncomplicated: Secondary | ICD-10-CM

## 2017-08-04 NOTE — Consult Note (Signed)
[facility] Consult   Reason for Consult:  Suicidal ideation Referring Physician:  EDP Patient Identification: Glendene Wyer MRN:  [id number] Principal Diagnosis: Suicidal ideation Diagnosis:   Patient Active Problem List   Diagnosis Date Noted  . 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 Suicidal ideation [R45.851] 08/04/2017  . PTSD (post-traumatic stress disorder) [F43.10] 08/04/2017    Total Time spent with patient: 45 minutes  Subjective:   Tiffney Haughton is a 27 y.o. female patient admitted with suicidal ideation while high on amphetamines.  HPI: Pt was seen and chart reviewed with treatment team and Dr Mariea Clonts. Pt stated she took too much "Molly" and realized she needed to come to the hospital. UDS positive for amphetamines, BAL negative. Pt stated she has lost everything that is important to her and knows she needs to detox from drugs so she can get her life back on track. Pt was seen by Peer Support and given substance abuse treatment resources in the community. Pt is willing to follow up with outpatient resources. Pt stated she has some passive suicidal ideation but has no plan. Pt is able to go live with her sister upon discharge and is able to contract for safety. Pt is stable and psychiatrically clear for discharge.  Past Psychiatric History: As above  Risk to Self:None Risk to Others: None Prior Inpatient Therapy: Prior Inpatient Therapy: Yes Prior Therapy Dates: age 32 Prior Therapy Facilty/Provider(s): [facility] Reason for Treatment: suicide attempt Prior Outpatient Therapy: Prior Outpatient Therapy: No Does patient have an ACCT team?: No Does patient have Intensive In-House Services?  : No Does patient have [facility] services? : No Does patient have P4CC services?: No  Past Medical History:  Past Medical History:  Diagnosis Date  . Back pain, chronic   . Bipolar 1 disorder ([place])   . Constipation   . Pneumonia   .  Prolapsed uterus     Past Surgical History:  Procedure Laterality Date  . APPENDECTOMY    . TUBAL LIGATION    . WISDOM TOOTH EXTRACTION     Family History: No family history on file. Family Psychiatric  History: Unknown Social History:  Social History   Substance and Sexual Activity  Alcohol Use No     Social History   Substance and Sexual Activity  Drug Use Yes  . Types: Methamphetamines   Comment: Molly     Social History   Socioeconomic History  . Marital status: Legally Separated    Spouse name: None  . Number of children: None  . Years of education: None  . Highest education level: None  Social Needs  . Financial resource strain: None  . Food insecurity - worry: None  . Food insecurity - inability: None  . Transportation needs - medical: None  . Transportation needs - non-medical: None  Occupational History  . None  Tobacco Use  . Smoking status: Current Every Day Smoker  . Smokeless tobacco: Never Used  Substance and Sexual Activity  . Alcohol use: No  . Drug use: Yes    Types: Methamphetamines    Comment: Molly   . Sexual activity: Yes    Birth control/protection: Surgical  Other Topics Concern  . None  Social History Narrative  . None   Additional Social History: N/A    Allergies:   Allergies  Allergen Reactions  . Azithromycin Hives  . Ciprofloxacin Hives  . Morphine And Related     Hives, and "it was really bad"  .  Prednisone Other (See Comments)    "makes face get red and feel hot"  . Penicillins Other (See Comments)     Has patient had a PCN reaction causing immediate rash, facial/tongue/throat swelling, SOB or lightheadedness with hypotension: yes Has patient had a PCN reaction causing severe rash involving mucus membranes or skin necrosis:  no Has patient had a PCN reaction that required hospitalization: no Has patient had a PCN reaction occurring within the last 10 years: yes If all of the above answers are "NO", then may proceed  with Cephalosporin use.     Labs:  Results for orders placed or performed during the hospital encounter of 08/03/17 (from the past 48 hour(s))  Comprehensive metabolic panel     Status: Abnormal   Collection Time: 08/03/17 12:30 PM  Result Value Ref Range   Sodium 138 135 - 145 mmol/L   Potassium 2.9 (L) 3.5 - 5.1 mmol/L   Chloride 105 101 - 111 mmol/L   CO2 27 22 - 32 mmol/L   Glucose, Bld 131 (H) 65 - 99 mg/dL   BUN <5 (L) 6 - 20 mg/dL   Creatinine, Ser 0.80 0.44 - 1.00 mg/dL   Calcium 8.6 (L) 8.9 - 10.3 mg/dL   Total Protein 7.3 6.5 - 8.1 g/dL   Albumin 3.7 3.5 - 5.0 g/dL   AST 22 15 - 41 U/L   ALT 26 14 - 54 U/L   Alkaline Phosphatase 51 38 - 126 U/L   Total Bilirubin 0.5 0.3 - 1.2 mg/dL   GFR calc non Af Amer >60 >60 mL/min   GFR calc Af Amer >60 >60 mL/min    Comment: (NOTE) The eGFR has been calculated using the CKD EPI equation. This calculation has not been validated in all clinical situations. eGFR's persistently <60 mL/min signify possible Chronic Kidney Disease.    Anion gap 6 5 - 15  Ethanol     Status: None   Collection Time: 08/03/17 12:30 PM  Result Value Ref Range   Alcohol, Ethyl (B) <10 <10 mg/dL    Comment:        LOWEST DETECTABLE LIMIT FOR SERUM ALCOHOL IS 10 mg/dL FOR MEDICAL PURPOSES ONLY   Salicylate level     Status: None   Collection Time: 08/03/17 12:30 PM  Result Value Ref Range   Salicylate Lvl <9.7 2.8 - 30.0 mg/dL  Acetaminophen level     Status: Abnormal   Collection Time: 08/03/17 12:30 PM  Result Value Ref Range   Acetaminophen (Tylenol), Serum <10 (L) 10 - 30 ug/mL    Comment:        THERAPEUTIC CONCENTRATIONS VARY SIGNIFICANTLY. A RANGE OF 10-30 ug/mL MAY BE AN EFFECTIVE CONCENTRATION FOR MANY PATIENTS. HOWEVER, SOME ARE BEST TREATED AT CONCENTRATIONS OUTSIDE THIS RANGE. ACETAMINOPHEN CONCENTRATIONS >150 ug/mL AT 4 HOURS AFTER INGESTION AND >50 ug/mL AT 12 HOURS AFTER INGESTION ARE OFTEN ASSOCIATED WITH  TOXIC REACTIONS.   cbc     Status: Abnormal   Collection Time: 08/03/17 12:30 PM  Result Value Ref Range   WBC 4.5 4.0 - 10.5 K/uL   RBC 4.12 3.87 - 5.11 MIL/uL   Hemoglobin 13.0 12.0 - 15.0 g/dL   HCT 37.7 36.0 - 46.0 %   MCV 91.5 78.0 - 100.0 fL   MCH 31.6 26.0 - 34.0 pg   MCHC 34.5 30.0 - 36.0 g/dL   RDW 11.4 (L) 11.5 - 15.5 %   Platelets 211 150 - 400 K/uL  I-Stat beta hCG blood,  ED     Status: None   Collection Time: 08/03/17 12:48 PM  Result Value Ref Range   I-stat hCG, quantitative <5.0 <5 mIU/mL   Comment 3            Comment:   GEST. AGE      CONC.  (mIU/mL)   <=1 WEEK        5 - 50     2 WEEKS       50 - 500     3 WEEKS       100 - 10,000     4 WEEKS     1,000 - 30,000        FEMALE AND NON-PREGNANT FEMALE:     LESS THAN 5 mIU/mL   Rapid urine drug screen (hospital performed)     Status: Abnormal   Collection Time: 08/03/17  2:00 PM  Result Value Ref Range   Opiates NONE DETECTED NONE DETECTED   Cocaine NONE DETECTED NONE DETECTED   Benzodiazepines NONE DETECTED NONE DETECTED   Amphetamines POSITIVE (A) NONE DETECTED   Tetrahydrocannabinol NONE DETECTED NONE DETECTED   Barbiturates NONE DETECTED NONE DETECTED    Comment: (NOTE) DRUG SCREEN FOR MEDICAL PURPOSES ONLY.  IF CONFIRMATION IS NEEDED FOR ANY PURPOSE, NOTIFY LAB WITHIN 5 DAYS. LOWEST DETECTABLE LIMITS FOR URINE DRUG SCREEN Drug Class                     Cutoff (ng/mL) Amphetamine and metabolites    1000 Barbiturate and metabolites    200 Benzodiazepine                 810 Tricyclics and metabolites     300 Opiates and metabolites        300 Cocaine and metabolites        300 THC                            50   Urinalysis, Routine w reflex microscopic     Status: Abnormal   Collection Time: 08/03/17  2:00 PM  Result Value Ref Range   Color, Urine STRAW (A) YELLOW   APPearance CLEAR CLEAR   Specific Gravity, Urine 1.001 (L) 1.005 - 1.030   pH 7.0 5.0 - 8.0   Glucose, UA NEGATIVE NEGATIVE  mg/dL   Hgb urine dipstick NEGATIVE NEGATIVE   Bilirubin Urine NEGATIVE NEGATIVE   Ketones, ur NEGATIVE NEGATIVE mg/dL   Protein, ur NEGATIVE NEGATIVE mg/dL   Nitrite NEGATIVE NEGATIVE   Leukocytes, UA NEGATIVE NEGATIVE    Current Facility-Administered Medications  Medication Dose Route Frequency Provider Last Rate Last Dose  . 0.9 %  sodium chloride infusion  1,000 mL Intravenous Continuous Charlesetta Shanks, MD 125 mL/hr at 08/03/17 1810 1,000 mL at 08/03/17 1810  . albuterol (PROVENTIL HFA;VENTOLIN HFA) 108 (90 Base) MCG/ACT inhaler 2 puff  2 puff Inhalation Q4H Charlesetta Shanks, MD   2 puff at 08/04/17 0912  . ipratropium-albuterol (DUONEB) 0.5-2.5 (3) MG/3ML nebulizer solution 3 mL  3 mL Nebulization Q4H PRN Daleen Bo, MD      . LORazepam (ATIVAN) tablet 1 mg  1 mg Oral Q6H PRN Daleen Bo, MD      . nicotine (NICODERM CQ - dosed in mg/24 hours) patch 21 mg  21 mg Transdermal Daily Daleen Bo, MD   21 mg at 08/04/17 1751   Current Outpatient Medications  Medication Sig Dispense Refill  .  albuterol (PROVENTIL HFA;VENTOLIN HFA) 108 (90 Base) MCG/ACT inhaler Inhale 2-4 puffs by mouth every 4 hours as needed for wheezing, cough, and/or shortness of breath (Patient not taking: Reported on 08/03/2017) 1 Inhaler 1  . clindamycin (CLEOCIN) 150 MG capsule Take 2 capsules (300 mg total) by mouth 3 (three) times daily. May dispense as 176m capsules (Patient not taking: Reported on 08/03/2017) 60 capsule 0  . cyclobenzaprine (FLEXERIL) 10 MG tablet Take 1 tablet (10 mg total) by mouth 3 (three) times daily as needed for muscle spasms. (Patient not taking: Reported on 08/03/2017) 30 tablet 0  . diphenhydrAMINE (BENADRYL) 25 MG tablet Take 1 tablet (25 mg total) every 6 (six) hours as needed by mouth. (Patient not taking: Reported on 08/03/2017) 30 tablet 0  . HYDROcodone-homatropine (HYCODAN) 5-1.5 MG/5ML syrup Take 5 mLs by mouth every 6 (six) hours as needed for cough. (Patient not taking:  Reported on 08/03/2017) 120 mL 0  . naproxen (NAPROSYN) 500 MG tablet Take 1 tablet (500 mg total) by mouth 2 (two) times daily with a meal. (Patient not taking: Reported on 08/03/2017) 30 tablet 0  . oxyCODONE-acetaminophen (PERCOCET) 5-325 MG tablet Take 1 tablet by mouth every 4 (four) hours as needed for moderate pain. (Patient not taking: Reported on 08/03/2017) 15 tablet 0  . potassium chloride SA (K-DUR,KLOR-CON) 20 MEQ tablet Take 1 tablet (20 mEq total) by mouth daily. 7 tablet 0  . predniSONE (DELTASONE) 20 MG tablet Take 2 tablets (40 mg total) daily by mouth. (Patient not taking: Reported on 08/03/2017) 8 tablet 0    Musculoskeletal: Strength & Muscle Tone: within normal limits Gait & Station: normal Patient leans: N/A  Psychiatric Specialty Exam: Physical Exam  Nursing note and vitals reviewed. Constitutional: She is oriented to person, place, and time. She appears well-developed and well-nourished.  HENT:  Head: Normocephalic.  Respiratory: Effort normal.  Musculoskeletal: Normal range of motion.  Neurological: She is alert and oriented to person, place, and time.  Psychiatric: Her speech is normal and behavior is normal. Thought content normal. Cognition and memory are normal. She expresses impulsivity. She exhibits a depressed mood.    Review of Systems  Psychiatric/Behavioral: Positive for depression and substance abuse. Negative for hallucinations, memory loss and suicidal ideas. The patient is nervous/anxious. The patient does not have insomnia.   All other systems reviewed and are negative.   Blood pressure 112/67, pulse 79, temperature 99 F (37.2 C), temperature source Oral, resp. rate 17, height 5' 7"  (1.702 m), weight 90.7 kg (200 lb), last menstrual period 07/10/2014, SpO2 99 %.Body mass index is 31.32 kg/m.  General Appearance: Casual  Eye Contact:  Good  Speech:  Clear and Coherent and Normal Rate  Volume:  Normal  Mood:  Depressed  Affect:  Congruent and  Depressed  Thought Process:  Coherent, Goal Directed and Linear  Orientation:  Full (Time, Place, and Person)  Thought Content:  Logical  Suicidal Thoughts:  No  Homicidal Thoughts:  No  Memory:  Immediate;   Good Recent;   Good Remote;   Fair  Judgement:  Poor  Insight:  Shallow  Psychomotor Activity:  Normal  Concentration:  Concentration: Good and Attention Span: Good  Recall:  Good  Fund of Knowledge:  Good  Language:  Good  Akathisia:  No  Handed:  Right  AIMS (if indicated):   N/A  Assets:  CAgricultural consultantHousing Resilience Social Support  ADL's:  Intact  Cognition:  WNL  Sleep:  N/A     Treatment Plan Summary: Plan Suicidal ideation  Discharge Home Follow up with [facility] and Drug Services Avoid the use of alcohol and drug services  Disposition: No evidence of imminent risk to self or others at present.   Patient does not meet criteria for psychiatric inpatient admission. Supportive therapy provided about ongoing stressors.  Ethelene Hal, NP 08/04/2017 12:58 PM   Patient seen face-to-face for psychiatric evaluation, chart reviewed and case discussed with the physician extender and developed treatment plan. Reviewed the information documented and agree with the treatment plan.  Buford Dresser, DO

## 2017-08-04 NOTE — [facility] Suicide Risk Assessment (Signed)
Suicide Risk Assessment  Discharge Assessment   [facility] Discharge Suicide Risk Assessment   Principal Problem: Suicidal ideation Discharge Diagnoses:  Patient Active Problem List   Diagnosis Date Noted  . Amphetamine abuse, continuous ([facility]) [F15.10] 08/04/2017  . Suicidal ideation [R45.851] 08/04/2017  . PTSD (post-traumatic stress disorder) [F43.10] 08/04/2017    Total Time spent with patient: 45 minutes  Musculoskeletal: Strength & Muscle Tone: within normal limits Gait & Station: normal Patient leans: N/A  Psychiatric Specialty Exam: Physical Exam  Constitutional: She is oriented to person, place, and time. She appears well-developed and well-nourished.  HENT:  Head: Normocephalic.  Respiratory: Effort normal.  Musculoskeletal: Normal range of motion.  Neurological: She is alert and oriented to person, place, and time.  Psychiatric: Her speech is normal and behavior is normal. Thought content normal. Cognition and memory are normal. She expresses impulsivity. She exhibits a depressed mood.   Review of Systems  Psychiatric/Behavioral: Positive for depression and substance abuse. Negative for hallucinations, memory loss and suicidal ideas. The patient is nervous/anxious. The patient does not have insomnia.   All other systems reviewed and are negative.  Blood pressure 112/67, pulse 79, temperature 99 F (37.2 C), temperature source Oral, resp. rate 17, height 5\' 7"  (1.702 m), weight 90.7 kg (200 lb), last menstrual period 07/10/2014, SpO2 99 %.Body mass index is 31.32 kg/m. General Appearance: Casual Eye Contact:  Good Speech:  Clear and Coherent and Normal Rate Volume:  Normal Mood:  Depressed Affect:  Congruent and Depressed Thought Process:  Coherent, Goal Directed and Linear Orientation:  Full (Time, Place, and Person) Thought Content:  Logical Suicidal Thoughts:  No Homicidal Thoughts:  No Memory:  Immediate;   Good Recent;   Good Remote;   Fair Judgement:   Poor Insight:  Shallow Psychomotor Activity:  Normal Concentration:  Concentration: Good and Attention Span: Good Recall:  Good Fund of Knowledge:  Good Language:  Good Akathisia:  No Handed:  Right AIMS (if indicated):    Assets:  ArchitectCommunication Skills Financial Resources/Insurance Housing Resilience Social Support ADL's:  Intact Cognition:  WNL   Mental Status Per Nursing Assessment::   On Admission:   suicidal ideation while high on amphetamines  Demographic Factors:  Adolescent or young adult, Caucasian, Low socioeconomic status and Unemployed  Loss Factors: Legal issues and Financial problems/change in socioeconomic status  Historical Factors: Impulsivity  Risk Reduction Factors:   Sense of responsibility to family  Continued Clinical Symptoms:  Depression:   Impulsivity Alcohol/Substance Abuse/Dependencies  Cognitive Features That Contribute To Risk:  Closed-mindedness    Suicide Risk:  Minimal: No identifiable suicidal ideation.  Patients presenting with no risk factors but with morbid ruminations; may be classified as minimal risk based on the severity of the depressive symptoms  Follow-up Information    [facility]. Schedule an appointment as soon as possible for a visit in 3 days.   Contact information: [address] [place] [id number] [phone]          Plan Of Care/Follow-up recommendations:  Activity:  as tolerated Diet:  Heart Healthy  Laveda AbbeLaurie Britton Parks, NP 08/04/2017, 1:11 PM

## 2017-08-04 NOTE — ED Notes (Signed)
Pt reports anhedonia with blunted affect and depressed mood. She does not want to kill herself, but the thought of being dead does not bother her. She said that her lifestyle is risky and she is homeless. Pt is motivated to get back on medications that might help her depression and anxiety. She reports a lot of psychological trauma in her past life. Per pt, her sister has agreed that she can live with her if pt is appropriate and takes her medications. Pt is interested in pursuing outpatient treatment.

## 2017-08-04 NOTE — ED Notes (Signed)
Pt discharged home. Discharged instructions read to pt who verbalized understanding. All belongings returned to pt who signed for same. Denies SI/HI, is not delusional and not responding to internal stimuli. Escorted pt to the ED exit.   Chaplain obtained clothing for pt because she came on the EMS with only a tank top.

## 2017-09-19 ENCOUNTER — Emergency Department (HOSPITAL_COMMUNITY): Payer: Self-pay

## 2017-09-19 ENCOUNTER — Encounter (HOSPITAL_COMMUNITY): Payer: Self-pay | Admitting: Emergency Medicine

## 2017-09-19 DIAGNOSIS — Z5321 Procedure and treatment not carried out due to patient leaving prior to being seen by health care provider: Secondary | ICD-10-CM | POA: Insufficient documentation

## 2017-09-19 DIAGNOSIS — R05 Cough: Secondary | ICD-10-CM | POA: Insufficient documentation

## 2017-09-19 NOTE — ED Triage Notes (Signed)
Pt c/o cough and rib pain x 4 days, reports that she has started vomiting today. Denies diarrhea/abdominal pain.

## 2017-09-20 ENCOUNTER — Emergency Department (HOSPITAL_COMMUNITY)
Admission: EM | Admit: 2017-09-20 | Discharge: 2017-09-20 | Disposition: A | Discharge status: Home / Self Care | Payer: Self-pay | Attending: Emergency Medicine | Admitting: Emergency Medicine

## 2017-09-20 NOTE — ED Notes (Signed)
Pt called 3x to reassess vitals 

## 2017-09-20 NOTE — ED Notes (Signed)
Pt called multiple times for vitals reassessment, Pt not present in waiting area.

## 2017-09-20 NOTE — ED Notes (Signed)
No answer in waiting area.

## 2017-09-23 ENCOUNTER — Other Ambulatory Visit: Payer: Self-pay

## 2017-09-23 ENCOUNTER — Emergency Department (HOSPITAL_COMMUNITY)
Admission: EM | Admit: 2017-09-23 | Discharge: 2017-09-23 | Disposition: A | Discharge status: Home / Self Care | Payer: Self-pay | Attending: Emergency Medicine | Admitting: Emergency Medicine

## 2017-09-23 ENCOUNTER — Encounter (HOSPITAL_COMMUNITY): Payer: Self-pay | Admitting: Emergency Medicine

## 2017-09-23 DIAGNOSIS — R059 Cough, unspecified: Secondary | ICD-10-CM

## 2017-09-23 DIAGNOSIS — R05 Cough: Secondary | ICD-10-CM | POA: Insufficient documentation

## 2017-09-23 DIAGNOSIS — F17200 Nicotine dependence, unspecified, uncomplicated: Secondary | ICD-10-CM | POA: Insufficient documentation

## 2017-09-23 DIAGNOSIS — Z79899 Other long term (current) drug therapy: Secondary | ICD-10-CM | POA: Insufficient documentation

## 2017-09-23 MED ORDER — DOXYCYCLINE HYCLATE 100 MG PO TABS
100.0000 mg | ORAL_TABLET | Freq: Once | ORAL | Status: AC
  Administered 2017-09-23: 100 mg via ORAL
  Filled 2017-09-23: qty 1

## 2017-09-23 MED ORDER — ALBUTEROL SULFATE HFA 108 (90 BASE) MCG/ACT IN AERS: 2.0000 | INHALATION_SPRAY | RESPIRATORY_TRACT | Status: DC | PRN

## 2017-09-23 MED ORDER — DOXYCYCLINE HYCLATE 100 MG PO CAPS: 100.0000 mg | ORAL_CAPSULE | Freq: Two times a day (BID) | ORAL | 0 refills | Status: DC

## 2017-09-23 NOTE — ED Provider Notes (Signed)
[facility] Provider Note   CSN: [id number] Arrival date & time: 09/23/17  0440     History   Chief Complaint Chief Complaint  Patient presents with  . Cough  . fever    HPI Sara Bradshaw is a 27 y.o. female.  Patient presents to the emergency department with a chief complaint of cough and fever.  She states that she was diagnosed with pneumonia approximately 2 weeks ago.  She states that she cannot afford her antibiotic.  She thought that the symptoms would go away on their own.  She states that they have not.  She states that she still has productive cough and pain when she is coughing.     The history is provided by the patient. No language interpreter was used.    Past Medical History:  Diagnosis Date  . Back pain, chronic   . Bipolar 1 disorder (HCC)   . Constipation   . Pneumonia   . Prolapsed uterus     Patient Active Problem List   Diagnosis Date Noted  . 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 Suicidal ideation 08/04/2017  . PTSD (post-traumatic stress disorder) 08/04/2017    Past Surgical History:  Procedure Laterality Date  . APPENDECTOMY    . TUBAL LIGATION    . WISDOM TOOTH EXTRACTION      OB History    Gravida Para Term Preterm AB Living   4 3 2 1 1 3    SAB TAB Ectopic Multiple Live Births   1       3       Home Medications    Prior to Admission medications   Medication Sig Start Date End Date Taking? Authorizing Provider  albuterol (PROVENTIL HFA;VENTOLIN HFA) 108 (90 Base) MCG/ACT inhaler Inhale 2-4 puffs by mouth every 4 hours as needed for wheezing, cough, and/or shortness of breath Patient not taking: Reported on 08/03/2017 05/27/17   Loleta RoseForbach, Cory, MD  clindamycin (CLEOCIN) 150 MG capsule Take 2 capsules (300 mg total) by mouth 3 (three) times daily. May dispense as 150mg  capsules Patient not taking: Reported on 08/03/2017 05/12/17   Garlon HatchetSanders, Lisa M, PA-C  cyclobenzaprine  (FLEXERIL) 10 MG tablet Take 1 tablet (10 mg total) by mouth 3 (three) times daily as needed for muscle spasms. Patient not taking: Reported on 08/03/2017 04/21/17   Dione BoozeGlick, David, MD  diphenhydrAMINE (BENADRYL) 25 MG tablet Take 1 tablet (25 mg total) every 6 (six) hours as needed by mouth. Patient not taking: Reported on 08/03/2017 06/15/17   Sharman CheekStafford, Phillip, MD  HYDROcodone-homatropine [facility](HYCODAN) 5-1.5 MG/5ML syrup Take 5 mLs by mouth every 6 (six) hours as needed for cough. Patient not taking: Reported on 08/03/2017 05/27/17   Loleta RoseForbach, Cory, MD  naproxen (NAPROSYN) 500 MG tablet Take 1 tablet (500 mg total) by mouth 2 (two) times daily with a meal. Patient not taking: Reported on 08/03/2017 05/12/17   Garlon HatchetSanders, Lisa M, PA-C  oxyCODONE-acetaminophen (PERCOCET) 5-325 MG tablet Take 1 tablet by mouth every 4 (four) hours as needed for moderate pain. Patient not taking: Reported on 08/03/2017 04/21/17   Dione BoozeGlick, David, MD  potassium chloride SA (K-DUR,KLOR-CON) 20 MEQ tablet Take 1 tablet (20 mEq total) by mouth daily. 08/03/17   Arby BarrettePfeiffer, Marcy, MD  predniSONE (DELTASONE) 20 MG tablet Take 2 tablets (40 mg total) daily by mouth. Patient not taking: Reported on 08/03/2017 06/15/17   Sharman CheekStafford, Phillip, MD    Family History No family history on file.  Social History Social History   Tobacco Use  . Smoking status: Current Every Day Smoker  . Smokeless tobacco: Never Used  Substance Use Topics  . Alcohol use: No  . Drug use: Yes    Types: Methamphetamines    Comment: Molly      Allergies   Azithromycin; Ciprofloxacin; Morphine and related; Prednisone; and Penicillins   Review of Systems Review of Systems  All other systems reviewed and are negative.    Physical Exam Updated Vital Signs BP 106/87 (BP Location: Right Arm)   Pulse (!) 104   Temp 99.6 F (37.6 C) (Oral)   Resp 20   Ht 5\' 7"  (1.702 m)   Wt 90.7 kg (200 lb)   LMP 07/10/2014   SpO2 99%   BMI 31.32 kg/m   Physical  Exam Physical Exam  Constitutional: Pt  is oriented to person, place, and time. Appears well-developed and well-nourished. No distress.  HENT:  Head: Normocephalic and atraumatic.  Right Ear: Tympanic membrane, external ear and ear canal normal.  Left Ear: Tympanic membrane, external ear and ear canal normal.  Nose: Mucosal edema and mild rhinorrhea present. No epistaxis. Right sinus exhibits no maxillary sinus tenderness and no frontal sinus tenderness. Left sinus exhibits no maxillary sinus tenderness and no frontal sinus tenderness.  Mouth/Throat: Uvula is midline and mucous membranes are normal. Mucous membranes are not pale and not cyanotic. No oropharyngeal exudate, posterior oropharyngeal edema, posterior oropharyngeal erythema or tonsillar abscesses.  Eyes: Conjunctivae are normal. Pupils are equal, round, and reactive to light.  Neck: Normal range of motion and full passive range of motion without pain.  Cardiovascular: Normal rate and intact distal pulses.   Pulmonary/Chest: Effort normal and breath sounds normal. No stridor.  Crackles in left lower lobe Abdominal: Soft. Bowel sounds are normal. There is no tenderness.  Musculoskeletal: Normal range of motion.  Lymphadenopathy:    Pthas no cervical adenopathy.  Neurological: Pt is alert and oriented to person, place, and time.  Skin: Skin is warm and dry. No rash noted. Pt is not diaphoretic.  Psychiatric: Normal mood and affect.  Nursing note and vitals reviewed.    ED Treatments / Results  Labs (all labs ordered are listed, but only abnormal results are displayed) Labs Reviewed - No data to display  EKG  EKG Interpretation None       Radiology No results found.  Procedures Procedures (including critical care time)  Medications Ordered in ED Medications  doxycycline (VIBRA-TABS) tablet 100 mg (not administered)     Initial Impression / Assessment and Plan / ED Course  I have reviewed the triage vital signs  and the nursing notes.  Pertinent labs & imaging results that were available during my care of the patient were reviewed by me and considered in my medical decision making (see chart for details).     Patient with recent dx of PNA.  Wasn't able to take abx due to price.  Crackles in left lung base.  Productive cough.  Low grade fever.  Will give inhaler and doxy.  Final Clinical Impressions(s) / ED Diagnoses   Final diagnoses:  Cough    ED Discharge Orders        Ordered    doxycycline (VIBRAMYCIN) 100 MG capsule  2 times daily     09/23/17 0531       Roxy Horseman, PA-C 09/23/17 1610    Glynn Octave, MD 09/23/17 0630

## 2017-09-23 NOTE — ED Triage Notes (Signed)
Pt in from home via [facility] with dx of PNA 2 wks ago - did not get rx for antibiotics filled d/t cost. States she "hasn't been able to smoke x 2 days". Cough mildly productive, c/o rib pain. Given 1 albuterol and 1 atrovent en route. Temp of 101 PTA, took 1g tylenol

## 2018-01-02 NOTE — Congregational Nurse Program (Signed)
Congregational Nurse Program Note  Date of Encounter: 12/31/2017  Past Medical History: Past Medical History:  Diagnosis Date  . Back pain, chronic   . Bipolar 1 disorder (HCC)   . Constipation   . Pneumonia   . Prolapsed uterus     Encounter Details: CNP Questionnaire - 12/31/17 1239      Questionnaire   Patient Status  Not Applicable    Race  White or Caucasian    [facility]    Insurance  Not Applicable    Uninsured  Uninsured (NEW 1x/quarter)    Food  Yes, have food insecurities;Within past 12 months, worried food would run out with no money to buy more    Housing/Utilities  Yes, have permanent housing    Transportation  No transportation needs    Interpersonal Safety  Yes, feel physically and emotionally safe where you currently live    Medication  Yes, have medication insecurities    Medical Provider  No    Referrals  Primary Care Provider/Clinic    ED Visit Averted  Not Applicable    Life-Saving Intervention Made  Not Applicable      presented with a beginning abcess left antecubital space.  Raised areas about dime size.  No redness nor is the wound open.  Instructed client to apply warm moist compresses for 20 minutes several times a day and not use the site for IV drug use until healed.  Encouraged to rotate sites (avoiding affected site until healed).  If site worsened or became reddened with a temperature, to seek medical attention

## 2018-01-06 ENCOUNTER — Emergency Department (HOSPITAL_COMMUNITY)
Admission: EM | Admit: 2018-01-06 | Discharge: 2018-01-06 | Disposition: A | Discharge status: Home / Self Care | Payer: Self-pay | Attending: Emergency Medicine | Admitting: Emergency Medicine

## 2018-01-06 ENCOUNTER — Encounter (HOSPITAL_COMMUNITY): Payer: Self-pay

## 2018-01-06 ENCOUNTER — Emergency Department (HOSPITAL_COMMUNITY): Payer: Self-pay

## 2018-01-06 ENCOUNTER — Other Ambulatory Visit: Payer: Self-pay

## 2018-01-06 DIAGNOSIS — E876 Hypokalemia: Secondary | ICD-10-CM | POA: Insufficient documentation

## 2018-01-06 DIAGNOSIS — F17200 Nicotine dependence, unspecified, uncomplicated: Secondary | ICD-10-CM | POA: Insufficient documentation

## 2018-01-06 DIAGNOSIS — R079 Chest pain, unspecified: Secondary | ICD-10-CM | POA: Insufficient documentation

## 2018-01-06 LAB — CBC
HCT: 38.4 % (ref 36.0–46.0)
Hemoglobin: 13.2 g/dL (ref 12.0–15.0)
MCH: 31.1 pg (ref 26.0–34.0)
MCHC: 34.4 g/dL (ref 30.0–36.0)
MCV: 90.4 fL (ref 78.0–100.0)
Platelets: 161 K/uL (ref 150–400)
RBC: 4.25 MIL/uL (ref 3.87–5.11)
RDW: 12.6 % (ref 11.5–15.5)
WBC: 10.8 K/uL — ABNORMAL HIGH (ref 4.0–10.5)

## 2018-01-06 LAB — I-STAT TROPONIN, ED: Troponin i, poc: 0.02 ng/mL (ref 0.00–0.08)

## 2018-01-06 LAB — BASIC METABOLIC PANEL WITH GFR
Anion gap: 10 (ref 5–15)
BUN: 11 mg/dL (ref 6–20)
CO2: 26 mmol/L (ref 22–32)
Calcium: 9 mg/dL (ref 8.9–10.3)
Chloride: 99 mmol/L — ABNORMAL LOW (ref 101–111)
Creatinine, Ser: 0.89 mg/dL (ref 0.44–1.00)
GFR calc Af Amer: >60 mL/min (ref >60)
GFR calc non Af Amer: >60 mL/min (ref >60)
Glucose, Bld: 104 mg/dL — ABNORMAL HIGH (ref 65–99)
Potassium: 3.3 mmol/L — ABNORMAL LOW (ref 3.5–5.1)
Sodium: 135 mmol/L (ref 135–145)

## 2018-01-06 LAB — I-STAT BETA HCG BLOOD, ED (MC, WL, AP ONLY): I-stat hCG, quantitative: 7.5 m[IU]/mL — ABNORMAL HIGH (ref <5)

## 2018-01-06 MED ORDER — POTASSIUM CHLORIDE CRYS ER 20 MEQ PO TBCR
40.0000 meq | EXTENDED_RELEASE_TABLET | Freq: Once | ORAL | Status: AC
  Administered 2018-01-06: 40 meq via ORAL
  Filled 2018-01-06: qty 2

## 2018-01-06 MED ORDER — IBUPROFEN 200 MG PO TABS
600.0000 mg | ORAL_TABLET | Freq: Once | ORAL | Status: AC
  Administered 2018-01-06: 600 mg via ORAL
  Filled 2018-01-06: qty 3

## 2018-01-06 MED ORDER — AMOXICILLIN 500 MG PO CAPS: 500.0000 mg | ORAL_CAPSULE | Freq: Three times a day (TID) | ORAL | 0 refills | Status: DC

## 2018-01-06 MED ORDER — AMOXICILLIN 500 MG PO CAPS
500.0000 mg | ORAL_CAPSULE | Freq: Once | ORAL | Status: AC
  Administered 2018-01-06: 500 mg via ORAL
  Filled 2018-01-06: qty 1

## 2018-01-06 NOTE — ED Notes (Signed)
Bed: WA15 Expected date:  Expected time:  Means of arrival:  Comments: 

## 2018-01-06 NOTE — ED Triage Notes (Signed)
Pt complains of sharp chest pain that started about one hour ago, she says that she hasn't felt good for a few days with intermittent fevers

## 2018-01-06 NOTE — ED Provider Notes (Signed)
[facility]-EMERGENCY DEPT Provider Note   CSN: [id number] Arrival date & time: 01/06/18  2130     History   Chief Complaint Chief Complaint  Patient presents with  . Chest Pain    HPI Sara Bradshaw is a 27 y.o. female who presents with acute onset of chest pain. PMH significant for bipolar d/o, tobacco use, hx of pneumonia, meth abuse. She states that she woke up acutely with L sided lower chest pain. It started at about 1AM and it was severe for about an hour. She also had numbness/tingling going down her left arm which scared her because her father died of a heart attack in his 30s. She reports associated SOB. She's also been running fevers which she attributes to her dental issues. She states she has constant, widespread dental pain and is working on getting in to a free clinic so she can get it taken care of. She states her chest pain is much better now but not resolved. She denies cough or wheezing or leg swelling. She states she's never had pain like this and doesn't feel like when she had pneumonia. She also states that she needs to leave to get to a very important appointment this morning with her nephew.  HPI  Past Medical History:  Diagnosis Date  . Back pain, chronic   . Bipolar 1 disorder (HCC)   . Constipation   . Pneumonia   . Prolapsed uterus     Patient Active Problem List   Diagnosis Date Noted  . 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 Suicidal ideation 08/04/2017  . PTSD (post-traumatic stress disorder) 08/04/2017    Past Surgical History:  Procedure Laterality Date  . APPENDECTOMY    . TUBAL LIGATION    . WISDOM TOOTH EXTRACTION       OB History    Gravida  4   Para  3   Term  2   Preterm  1   AB  1   Living  3     SAB  1   TAB      Ectopic      Multiple      Live Births  3            Home Medications    Prior to Admission medications   Medication Sig Start Date End Date Taking?  Authorizing Provider  albuterol (PROVENTIL HFA;VENTOLIN HFA) 108 (90 Base) MCG/ACT inhaler Inhale 2-4 puffs by mouth every 4 hours as needed for wheezing, cough, and/or shortness of breath Patient not taking: Reported on 08/03/2017 05/27/17   Loleta Rose, MD  clindamycin (CLEOCIN) 150 MG capsule Take 2 capsules (300 mg total) by mouth 3 (three) times daily. May dispense as 150mg  capsules Patient not taking: Reported on 08/03/2017 05/12/17   Garlon Hatchet, PA-C  cyclobenzaprine (FLEXERIL) 10 MG tablet Take 1 tablet (10 mg total) by mouth 3 (three) times daily as needed for muscle spasms. Patient not taking: Reported on 08/03/2017 04/21/17   Dione Booze, MD  diphenhydrAMINE (BENADRYL) 25 MG tablet Take 1 tablet (25 mg total) every 6 (six) hours as needed by mouth. Patient not taking: Reported on 08/03/2017 06/15/17   Sharman Cheek, MD  doxycycline (VIBRAMYCIN) 100 MG capsule Take 1 capsule (100 mg total) by mouth 2 (two) times daily. Patient not taking: Reported on 01/06/2018 09/23/17   Roxy Horseman, PA-C  HYDROcodone-homatropine [facility]) 5-1.5 MG/5ML syrup Take 5 mLs by mouth every 6 (six) hours as needed for  cough. Patient not taking: Reported on 08/03/2017 05/27/17   Loleta RoseForbach, Cory, MD  naproxen (NAPROSYN) 500 MG tablet Take 1 tablet (500 mg total) by mouth 2 (two) times daily with a meal. Patient not taking: Reported on 08/03/2017 05/12/17   Garlon HatchetSanders, Lisa M, PA-C  oxyCODONE-acetaminophen (PERCOCET) 5-325 MG tablet Take 1 tablet by mouth every 4 (four) hours as needed for moderate pain. Patient not taking: Reported on 08/03/2017 04/21/17   Dione BoozeGlick, David, MD  potassium chloride SA (K-DUR,KLOR-CON) 20 MEQ tablet Take 1 tablet (20 mEq total) by mouth daily. Patient not taking: Reported on 01/06/2018 08/03/17   Arby BarrettePfeiffer, Marcy, MD  predniSONE (DELTASONE) 20 MG tablet Take 2 tablets (40 mg total) daily by mouth. Patient not taking: Reported on 08/03/2017 06/15/17   Sharman CheekStafford, Phillip, MD    Family  History History reviewed. No pertinent family history.  Social History Social History   Tobacco Use  . Smoking status: Current Every Day Smoker  . Smokeless tobacco: Never Used  Substance Use Topics  . Alcohol use: No  . Drug use: Yes    Types: Methamphetamines    Comment: Molly      Allergies   Azithromycin; Ciprofloxacin; Morphine and related; Prednisone; and Penicillins   Review of Systems Review of Systems  Constitutional: Positive for fever.  HENT: Positive for dental problem.   Respiratory: Positive for shortness of breath. Negative for cough and wheezing.   Cardiovascular: Positive for chest pain. Negative for palpitations and leg swelling.  Gastrointestinal: Negative for abdominal pain.  All other systems reviewed and are negative.    Physical Exam Updated Vital Signs BP 118/69 (BP Location: Left Arm)   Pulse (!) 114   Temp 100.1 F (37.8 C) (Oral)   Resp 16   LMP 07/10/2014   SpO2 100%   Physical Exam  Constitutional: She is oriented to person, place, and time. She appears well-developed and well-nourished. No distress.  Chronically ill appearing  HENT:  Head: Normocephalic and atraumatic.  Wide spread dental decay most notable over the front teeth  Eyes: Pupils are equal, round, and reactive to light. Conjunctivae are normal. Right eye exhibits no discharge. Left eye exhibits no discharge. No scleral icterus.  Neck: Normal range of motion.  Cardiovascular: Regular rhythm. Tachycardia present. Exam reveals no gallop and no friction rub.  No murmur heard. Pulmonary/Chest: Effort normal and breath sounds normal. No respiratory distress.  Abdominal: She exhibits no distension.  Neurological: She is alert and oriented to person, place, and time.  Skin: Skin is warm and dry.  Psychiatric: She has a normal mood and affect. Her behavior is normal.  Nursing note and vitals reviewed.    ED Treatments / Results  Labs (all labs ordered are listed, but only  abnormal results are displayed) Labs Reviewed  BASIC METABOLIC PANEL - Abnormal; Notable for the following components:      Result Value   Potassium 3.3 (*)    Chloride 99 (*)    Glucose, Bld 104 (*)    All other components within normal limits  CBC - Abnormal; Notable for the following components:   WBC 10.8 (*)    All other components within normal limits  I-STAT BETA HCG BLOOD, ED ([facility], [facility], [facility] ONLY) - Abnormal; Notable for the following components:   I-stat hCG, quantitative 7.5 (*)    All other components within normal limits  I-STAT TROPONIN, ED    EKG None  Radiology Dg Chest 2 View  Result Date: 01/06/2018 CLINICAL DATA:  Awoke from sleep with chest pain. EXAM: CHEST - 2 VIEW COMPARISON:  08/30/2017 FINDINGS: The cardiomediastinal contours are normal. Minimal subsegmental atelectasis or scarring at the left lung base. Pulmonary vasculature is normal. No consolidation, pleural effusion, or pneumothorax. No acute osseous abnormalities are seen. IMPRESSION: Unchanged left lung base atelectasis or scarring. No acute findings. Electronically Signed   By: Rubye Oaks M.D.   On: 01/06/2018 04:25    Procedures Procedures (including critical care time)  Medications Ordered in ED Medications  ibuprofen (ADVIL,MOTRIN) tablet 600 mg (600 mg Oral Given 01/06/18 0703)  potassium chloride SA (K-DUR,KLOR-CON) CR tablet 40 mEq (40 mEq Oral Given 01/06/18 0704)  amoxicillin (AMOXIL) capsule 500 mg (500 mg Oral Given 01/06/18 0706)     Initial Impression / Assessment and Plan / ED Course  I have reviewed the triage vital signs and the nursing notes.  Pertinent labs & imaging results that were available during my care of the patient were reviewed by me and considered in my medical decision making (see chart for details).  27 year old female presents with fever, dental pain, and acute onset of chest pain which has pretty much resolved on my evaluation. She is febrile in the ED. I  personally checked her temp and was 100.4. She is also tachycardic but otherwise vitals are normal. Her EKG in triage was sinus tachycardia. CXR was negative for acute process. CBC is remarkable for mild leukocytosis to 10.8 and BMP is remarkable for mild hypokalemia (3.3). Troponin is 0.02. She states symptoms don't feel similar as to when she had pneumonia. Unclear if her symptoms are related to her meth use or not. She was given Ibuprofen, Amoxil, and potassium. I wanted to do a more complete evaluation with vital recheck and 2nd troponin however the patient stated she had to urgently leave. She was unwilling to stay for a re-eval. She was given rx for Amoxil and strict return precautions.  Final Clinical Impressions(s) / ED Diagnoses   Final diagnoses:  Nonspecific chest pain  Hypokalemia    ED Discharge Orders    None       Bethel Born, PA-C 01/06/18 0741    Raeford Razor, MD 01/06/18 1312

## 2018-01-06 NOTE — Discharge Instructions (Addendum)
Please return if worsening Take Amoxicillin three times daily for dental infection Take Ibuprofen or Tylenol for fever and pain

## 2018-01-08 NOTE — Congregational Nurse Program (Signed)
Congregational Nurse Program Note  Date of Encounter: 01/08/2018  Past Medical History: Past Medical History:  Diagnosis Date  . Back pain, chronic   . Bipolar 1 disorder (HCC)   . Constipation   . Pneumonia   . Prolapsed uterus     Encounter Details: CNP Questionnaire - 01/08/18 1302      Questionnaire   Patient Status  Not Applicable    Race  White or Caucasian    [facility]    Insurance  Not Applicable    Uninsured  Uninsured (Subsequent visits/quarter)    Food  Yes, have food insecurities;Within past 12 months, worried food would run out with no money to buy more    Housing/Utilities  Yes, have permanent housing    Transportation  No transportation needs    Interpersonal Safety  Yes, feel physically and emotionally safe where you currently live    Medication  Yes, have medication insecurities    Medical Provider  No    Referrals  Primary Care Provider/Clinic    ED Visit Averted  Not Applicable    Life-Saving Intervention Made  Not Applicable      client checking in.  States was recently seen in the ED for chest pain, but is feeling better.  Beginning treatment at [facility]

## 2018-02-09 ENCOUNTER — Other Ambulatory Visit: Payer: Self-pay

## 2018-02-09 ENCOUNTER — Emergency Department (HOSPITAL_COMMUNITY)
Admission: EM | Admit: 2018-02-09 | Discharge: 2018-02-09 | Disposition: A | Discharge status: Home / Self Care | Payer: Self-pay | Attending: Emergency Medicine | Admitting: Emergency Medicine

## 2018-02-09 ENCOUNTER — Encounter (HOSPITAL_COMMUNITY): Payer: Self-pay | Admitting: Emergency Medicine

## 2018-02-09 DIAGNOSIS — H6091 Unspecified otitis externa, right ear: Secondary | ICD-10-CM | POA: Insufficient documentation

## 2018-02-09 DIAGNOSIS — F17210 Nicotine dependence, cigarettes, uncomplicated: Secondary | ICD-10-CM | POA: Insufficient documentation

## 2018-02-09 DIAGNOSIS — H60501 Unspecified acute noninfective otitis externa, right ear: Secondary | ICD-10-CM

## 2018-02-09 LAB — GROUP A STREP BY PCR: Group A Strep by PCR: NOT DETECTED

## 2018-02-09 MED ORDER — NEOMYCIN-COLIST-HC-THONZONIUM 3.3-3-10-0.5 MG/ML OT SUSP
4.0000 [drp] | Freq: Four times a day (QID) | OTIC | Status: DC
  Filled 2018-02-09: qty 10

## 2018-02-09 MED ORDER — NEOMYCIN-POLYMYXIN-HC 3.5-10000-1 OT SUSP
4.0000 [drp] | Freq: Four times a day (QID) | OTIC | Status: DC
  Administered 2018-02-09: 4 [drp] via OTIC
  Filled 2018-02-09: qty 10

## 2018-02-09 MED ORDER — CEFDINIR 300 MG PO CAPS: 300.0000 mg | ORAL_CAPSULE | Freq: Two times a day (BID) | ORAL | 0 refills | Status: DC

## 2018-02-09 MED ORDER — CEFDINIR 300 MG PO CAPS
300.0000 mg | ORAL_CAPSULE | Freq: Two times a day (BID) | ORAL | Status: DC
  Administered 2018-02-09: 300 mg via ORAL
  Filled 2018-02-09 (×2): qty 1

## 2018-02-09 NOTE — ED Provider Notes (Signed)
[facility] Provider Note   CSN: [id number] Arrival date & time: 02/09/18  0128     History   Chief Complaint Chief Complaint  Patient presents with  . Sore Throat  . Otalgia    HPI Sara Bradshaw is a 27 y.o. female.  The history is provided by the patient and medical records.     27 year old female with history of chronic back pain, bipolar disorder, PTSD, presenting to the ED with right ear pain and sore throat.  This began yesterday and has been worsening she reports majority of her symptoms are in her right ear.  She denies any difficulty swallowing.  No fever or chills.  No sick contacts.  She has been swimming recently.  Past Medical History:  Diagnosis Date  . Back pain, chronic   . Bipolar 1 disorder (HCC)   . Constipation   . Pneumonia   . Prolapsed uterus     Patient Active Problem List   Diagnosis Date Noted  . 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 Suicidal ideation 08/04/2017  . PTSD (post-traumatic stress disorder) 08/04/2017    Past Surgical History:  Procedure Laterality Date  . ABDOMINAL HYSTERECTOMY    . APPENDECTOMY    . TUBAL LIGATION    . WISDOM TOOTH EXTRACTION       OB History    Gravida  4   Para  3   Term  2   Preterm  1   AB  1   Living  3     SAB  1   TAB      Ectopic      Multiple      Live Births  3            Home Medications    Prior to Admission medications   Medication Sig Start Date End Date Taking? Authorizing Provider  albuterol (PROVENTIL HFA;VENTOLIN HFA) 108 (90 Base) MCG/ACT inhaler Inhale 2-4 puffs by mouth every 4 hours as needed for wheezing, cough, and/or shortness of breath Patient not taking: Reported on 08/03/2017 05/27/17   Loleta RoseForbach, Cory, MD  amoxicillin (AMOXIL) 500 MG capsule Take 1 capsule (500 mg total) by mouth 3 (three) times daily. 01/06/18   Bethel BornGekas, Kelly Marie, PA-C  potassium chloride SA (K-DUR,KLOR-CON) 20 MEQ tablet Take  1 tablet (20 mEq total) by mouth daily. Patient not taking: Reported on 01/06/2018 08/03/17   Arby BarrettePfeiffer, Marcy, MD  predniSONE (DELTASONE) 20 MG tablet Take 2 tablets (40 mg total) daily by mouth. Patient not taking: Reported on 08/03/2017 06/15/17   Sharman CheekStafford, Phillip, MD    Family History No family history on file.  Social History Social History   Tobacco Use  . Smoking status: Current Every Day Smoker  . Smokeless tobacco: Never Used  Substance Use Topics  . Alcohol use: No  . Drug use: Yes    Types: Methamphetamines    Comment: Molly      Allergies   Azithromycin; Ciprofloxacin; Morphine and related; Prednisone; and Penicillins   Review of Systems Review of Systems  HENT: Positive for ear pain and sore throat.   All other systems reviewed and are negative.    Physical Exam Updated Vital Signs BP (!) 141/103 (BP Location: Right Arm)   Pulse (!) 116   Temp 98.5 F (36.9 C) (Oral)   Resp 16   LMP 07/10/2014   SpO2 100%   Physical Exam  Constitutional: She is oriented to person, place, and  time. She appears well-developed and well-nourished.  HENT:  Head: Normocephalic and atraumatic.  Mouth/Throat: Oropharynx is clear and moist.  Right EAC swollen and erythematous, TM appears injected; no drainage, bleeding, or signs of TM rupture; no mastoid tenderness Left ear normal Tonsils overall normal in appearance bilaterally without exudate; uvula midline without evidence of peritonsillar abscess; handling secretions appropriately; no difficulty swallowing or speaking; normal phonation without stridor  Eyes: Pupils are equal, round, and reactive to light. Conjunctivae and EOM are normal.  Neck: Normal range of motion.  Cardiovascular: Normal rate, regular rhythm and normal heart sounds.  Pulmonary/Chest: Effort normal and breath sounds normal.  Abdominal: Soft. Bowel sounds are normal.  Musculoskeletal: Normal range of motion.  Neurological: She is alert and oriented to  person, place, and time.  Skin: Skin is warm and dry.  Psychiatric: She has a normal mood and affect.  Nursing note and vitals reviewed.    ED Treatments / Results  Labs (all labs ordered are listed, but only abnormal results are displayed) Labs Reviewed  GROUP A STREP BY PCR    EKG None  Radiology No results found.  Procedures Procedures (including critical care time)  Medications Ordered in ED Medications  cefdinir (OMNICEF) capsule 300 mg (300 mg Oral Given 02/09/18 0352)  neomycin-polymyxin-hydrocortisone (CORTISPORIN) OTIC (EAR) suspension 4 drop (4 drops Right EAR Given 02/09/18 0352)     Initial Impression / Assessment and Plan / ED Course  I have reviewed the triage vital signs and the nursing notes.  Pertinent labs & imaging results that were available during my care of the patient were reviewed by me and considered in my medical decision making (see chart for details).  27 year old female here with right ear pain and sore throat.  Began yesterday and has been worsening.  Has been swimming recently.  On exam she has findings of otitis externa with possible developing otitis media as well.  No tonsillar edema or exudates.  Rapid strep is negative.  Patient be treated with Omnicef and Cortisporin drops, first doses given here and tolerated well.  She is to follow-up with PCP and/or ENT.  Discussed plan with patient, he/she acknowledged understanding and agreed with plan of care.  Return precautions given for new or worsening symptoms.  Final Clinical Impressions(s) / ED Diagnoses   Final diagnoses:  Acute otitis externa of right ear, unspecified type    ED Discharge Orders        Ordered    cefdinir (OMNICEF) 300 MG capsule  2 times daily     02/09/18 0402       Garlon Hatchet, PA-C 02/09/18 4098    Geoffery Lyons, MD 02/09/18 [phone]

## 2018-02-09 NOTE — ED Notes (Signed)
Pt understood dc material. NAD noted. Script given at dc  

## 2018-02-09 NOTE — Discharge Instructions (Signed)
Take the prescribed medication as directed.  Continue using ear drops as directed. Follow-up with your primary care doctor or ENT if any ongoing issues. Return to the ED for new or worsening symptoms.

## 2018-02-09 NOTE — ED Triage Notes (Signed)
C/o sore throat and R ear pain since yesterday.

## 2018-02-19 MED FILL — CEFDINIR 300 MG CAPSULE: 300 | 10 days supply | Qty: 20 | Fill #0

## 2018-02-24 NOTE — Congregational Nurse Program (Signed)
Congregational Nurse Program Note  Date of Encounter: 02/20/2018  Past Medical History: Past Medical History:  Diagnosis Date  . Back pain, chronic   . Bipolar 1 disorder (HCC)   . Constipation   . Pneumonia   . Prolapsed uterus     Encounter Details: CNP Questionnaire - 02/19/18 1658      Questionnaire   Patient Status  Not Applicable    Race  White or Caucasian    [facility]    Insurance  Not Applicable    Uninsured  Uninsured (Subsequent visits/quarter)    Food  Yes, have food insecurities;Within past 12 months, worried food would run out with no money to buy more    Housing/Utilities  Yes, have permanent housing    Transportation  No transportation needs    Interpersonal Safety  Yes, feel physically and emotionally safe where you currently live    Medication  Yes, have medication insecurities;Provided medication assistance    Medical Provider  No    Referrals  Primary Care Provider/Clinic    ED Visit Averted  Not Applicable    Life-Saving Intervention Made  Not Applicable      Medication - Omnicef 300 mg given to client with instructions

## 2018-02-24 NOTE — Congregational Nurse Program (Signed)
Congregational Nurse Program Note  Date of Encounter: 02/18/2018  Past Medical History: Past Medical History:  Diagnosis Date  . Back pain, chronic   . Bipolar 1 disorder (HCC)   . Constipation   . Pneumonia   . Prolapsed uterus     Encounter Details: CNP Questionnaire - 02/18/18 1655      Questionnaire   Patient Status  Not Applicable    Race  White or Caucasian    [facility]    Insurance  Not Applicable    Uninsured  Uninsured (NEW 1x/quarter)    Food  Yes, have food insecurities;Within past 12 months, worried food would run out with no money to buy more    Housing/Utilities  Yes, have permanent housing    Transportation  No transportation needs    Interpersonal Safety  Yes, feel physically and emotionally safe where you currently live    Medication  Yes, have medication insecurities    Medical Provider  No    Referrals  Primary Care Provider/Clinic    ED Visit Averted  Not Applicable    Life-Saving Intervention Made  Not Applicable      Client needed script filled for Omnicef 300mg  for ear infection.  Client still needs a PCP.  [facility].  [facility]
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
## 2018-02-27 ENCOUNTER — Other Ambulatory Visit: Payer: Self-pay

## 2018-02-27 ENCOUNTER — Emergency Department (HOSPITAL_COMMUNITY): Payer: Self-pay

## 2018-02-27 ENCOUNTER — Emergency Department (HOSPITAL_COMMUNITY)
Admission: EM | Admit: 2018-02-27 | Discharge: 2018-02-27 | Disposition: A | Discharge status: Home / Self Care | Payer: Self-pay | Attending: Emergency Medicine | Admitting: Emergency Medicine

## 2018-02-27 DIAGNOSIS — Y929 Unspecified place or not applicable: Secondary | ICD-10-CM | POA: Insufficient documentation

## 2018-02-27 DIAGNOSIS — X500XXA Overexertion from strenuous movement or load, initial encounter: Secondary | ICD-10-CM | POA: Insufficient documentation

## 2018-02-27 DIAGNOSIS — T1490XA Injury, unspecified, initial encounter: Secondary | ICD-10-CM

## 2018-02-27 DIAGNOSIS — F17200 Nicotine dependence, unspecified, uncomplicated: Secondary | ICD-10-CM | POA: Insufficient documentation

## 2018-02-27 DIAGNOSIS — Y939 Activity, unspecified: Secondary | ICD-10-CM | POA: Insufficient documentation

## 2018-02-27 DIAGNOSIS — Y999 Unspecified external cause status: Secondary | ICD-10-CM | POA: Insufficient documentation

## 2018-02-27 DIAGNOSIS — S63642A Sprain of metacarpophalangeal joint of left thumb, initial encounter: Secondary | ICD-10-CM | POA: Insufficient documentation

## 2018-02-27 MED ORDER — IBUPROFEN 400 MG PO TABS: 400.0000 mg | ORAL_TABLET | Freq: Once | ORAL | Status: DC

## 2018-02-27 NOTE — ED Provider Notes (Signed)
[facility] Provider Note   CSN: [id number] Arrival date & time: 02/27/18  0530     History   Chief Complaint Chief Complaint  Patient presents with  . Hand Injury    HPI Sara Bradshaw is a 27 y.o. female.  The history is provided by the patient.  Hand Injury   The incident occurred yesterday. The incident occurred at home. The pain is present in the left hand. The quality of the pain is described as aching. The pain is moderate. The pain has been constant since the incident. Pertinent negatives include no fever. The symptoms are aggravated by movement. She has tried ice for the symptoms. The treatment provided no relief.   She reports her left thumb got caught in a tote and it was bent backwards.  No direct trauma to the hand.  No crush injury.  She reports left thumb hurts to move. Past Medical History:  Diagnosis Date  . Back pain, chronic   . Bipolar 1 disorder (HCC)   . Constipation   . Pneumonia   . Prolapsed uterus     Patient Active Problem List   Diagnosis Date Noted  . 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 Suicidal ideation 08/04/2017  . PTSD (post-traumatic stress disorder) 08/04/2017    Past Surgical History:  Procedure Laterality Date  . ABDOMINAL HYSTERECTOMY    . APPENDECTOMY    . TUBAL LIGATION    . WISDOM TOOTH EXTRACTION       OB History    Gravida  4   Para  3   Term  2   Preterm  1   AB  1   Living  3     SAB  1   TAB      Ectopic      Multiple      Live Births  3            Home Medications    Prior to Admission medications   Medication Sig Start Date End Date Taking? Authorizing Provider  albuterol (PROVENTIL HFA;VENTOLIN HFA) 108 (90 Base) MCG/ACT inhaler Inhale 2-4 puffs by mouth every 4 hours as needed for wheezing, cough, and/or shortness of breath Patient not taking: Reported on 08/03/2017 05/27/17   Loleta RoseForbach, Cory, MD    Family History No family  history on file.  Social History Social History   Tobacco Use  . Smoking status: Current Every Day Smoker  . Smokeless tobacco: Never Used  Substance Use Topics  . Alcohol use: No  . Drug use: Yes    Types: Methamphetamines    Comment: Molly      Allergies   Azithromycin; Ciprofloxacin; Morphine and related; Prednisone; and Penicillins   Review of Systems Review of Systems  Constitutional: Negative for fever.  Musculoskeletal: Positive for arthralgias.     Physical Exam Updated Vital Signs BP 136/86   Pulse 98   Temp 97.9 F (36.6 C) (Oral)   Resp 16   Ht 1.702 m (5\' 7" )   LMP 07/10/2014   SpO2 98%   BMI 28.98 kg/m   Physical Exam CONSTITUTIONAL: Well developed/well nourished HEAD: Normocephalic/atraumatic EYES: EOMI ENMT: Mucous membranes moist NECK: supple no meningeal signs NEURO: Pt is awake/alert/appropriate, moves all extremitiesx4.  No facial droop.  She is able to move all fingers without difficulty. EXTREMITIES: pulses normal/equal, full ROM Tenderness to left thumb.  No deformities.  Limited range of motion with flexion due to pain.  Mild  tenderness to thenar eminence.  No other focal tenderness to her hand. SKIN: warm, color normal PSYCH: no abnormalities of mood noted, alert and oriented to situation   ED Treatments / Results  Labs (all labs ordered are listed, but only abnormal results are displayed) Labs Reviewed - No data to display  EKG None  Radiology Dg Hand Complete Left  Result Date: 02/27/2018 CLINICAL DATA:  LEFT thumb caught in [place]. Redness and swelling this morning. EXAM: LEFT HAND - COMPLETE 3+ VIEW COMPARISON:  LEFT hand radiograph June 04, 2008 FINDINGS: No acute fracture deformity or dislocation. No destructive bony lesions. Soft tissue planes are not suspicious. IMPRESSION: Negative. Electronically Signed   By: Awilda Metro M.D.   On: 02/27/2018 06:46    Procedures Procedures Medications Ordered in  ED Medications - No data to display   Initial Impression / Assessment and Plan / ED Course  I have reviewed the triage vital signs and the nursing notes.  Pertinent  imaging results that were available during my care of the patient were reviewed by me and considered in my medical decision making (see chart for details).     Sprain of left thumb.  Will order removable thumb spica, will refer to hand surgery if no improvement in 1 week  Final Clinical Impressions(s) / ED Diagnoses   Final diagnoses:  Sprain of metacarpophalangeal (MCP) joint of left thumb, initial encounter    ED Discharge Orders    None       Zadie Rhine, MD 02/27/18 [phone]

## 2018-02-27 NOTE — ED Triage Notes (Signed)
Patient c/o left hand injury that occurred yesterday after she dropped a storage tote on her hand.

## 2018-02-27 NOTE — ED Notes (Signed)
Patient transported to X-ray 

## 2018-02-27 NOTE — ED Notes (Signed)
Paged ortho 

## 2018-04-10 NOTE — Congregational Nurse Program (Signed)
Client reports she cannot move her left arm from the elbow.  Left antecubital area is swollen with bruising in stages of healing.  No redness or evidence of wound.  ROM is limited.  Client expresses much concern over symptoms.  Discussed with client safe needle use and not using that site and applying warm moist packs several times a day.  Client states she has done that with no relief.  Suggested client be seen at Urgent Care for a medical evaluation.  Gas card provided to assist with transportation

## 2018-04-20 NOTE — Congregational Nurse Program (Signed)
Client reports [facility].  Was discharged and needed her anti-biotic filled.  Medication [facility] through [facility] And delivered to client.

## 2019-03-24 DIAGNOSIS — M659 Synovitis and tenosynovitis, unspecified: Secondary | ICD-10-CM

## 2019-03-24 DIAGNOSIS — L03011 Cellulitis of right finger: Secondary | ICD-10-CM

## 2019-03-25 DIAGNOSIS — L03011 Cellulitis of right finger: Secondary | ICD-10-CM

## 2019-03-25 DIAGNOSIS — M659 Synovitis and tenosynovitis, unspecified: Secondary | ICD-10-CM

## 2019-04-02 ENCOUNTER — Emergency Department (HOSPITAL_COMMUNITY)
Admission: EM | Admit: 2019-04-02 | Discharge: 2019-04-03 | Disposition: A | Discharge status: Home / Self Care | Payer: Self-pay | Attending: Emergency Medicine | Admitting: Emergency Medicine

## 2019-04-02 ENCOUNTER — Other Ambulatory Visit: Payer: Self-pay

## 2019-04-02 ENCOUNTER — Encounter (HOSPITAL_COMMUNITY): Payer: Self-pay

## 2019-04-02 DIAGNOSIS — Z5321 Procedure and treatment not carried out due to patient leaving prior to being seen by health care provider: Secondary | ICD-10-CM | POA: Insufficient documentation

## 2019-04-02 DIAGNOSIS — M79641 Pain in right hand: Secondary | ICD-10-CM | POA: Insufficient documentation

## 2019-04-02 NOTE — ED Triage Notes (Signed)
Pt presents with Right hand pain and swelling. She had surgery approx 1 week ago, reports skin peeling off and a lot of "white stuff" coming out of it.

## 2019-07-15 IMAGING — CR DG ANKLE COMPLETE 3+V*L*
1 series · 3 of 3 positions shown · non-contrast
Comparison: 02/25/2017

CLINICAL DATA: Injury to the left ankle. Feels like paper ripping.
Pain and unable to bear weight.

EXAM:
LEFT ANKLE COMPLETE - 3+ VIEW

[Series 1: dg ankle complete left · 0.14mm/px · 3 of 3 slices shown]
[im 1/3]
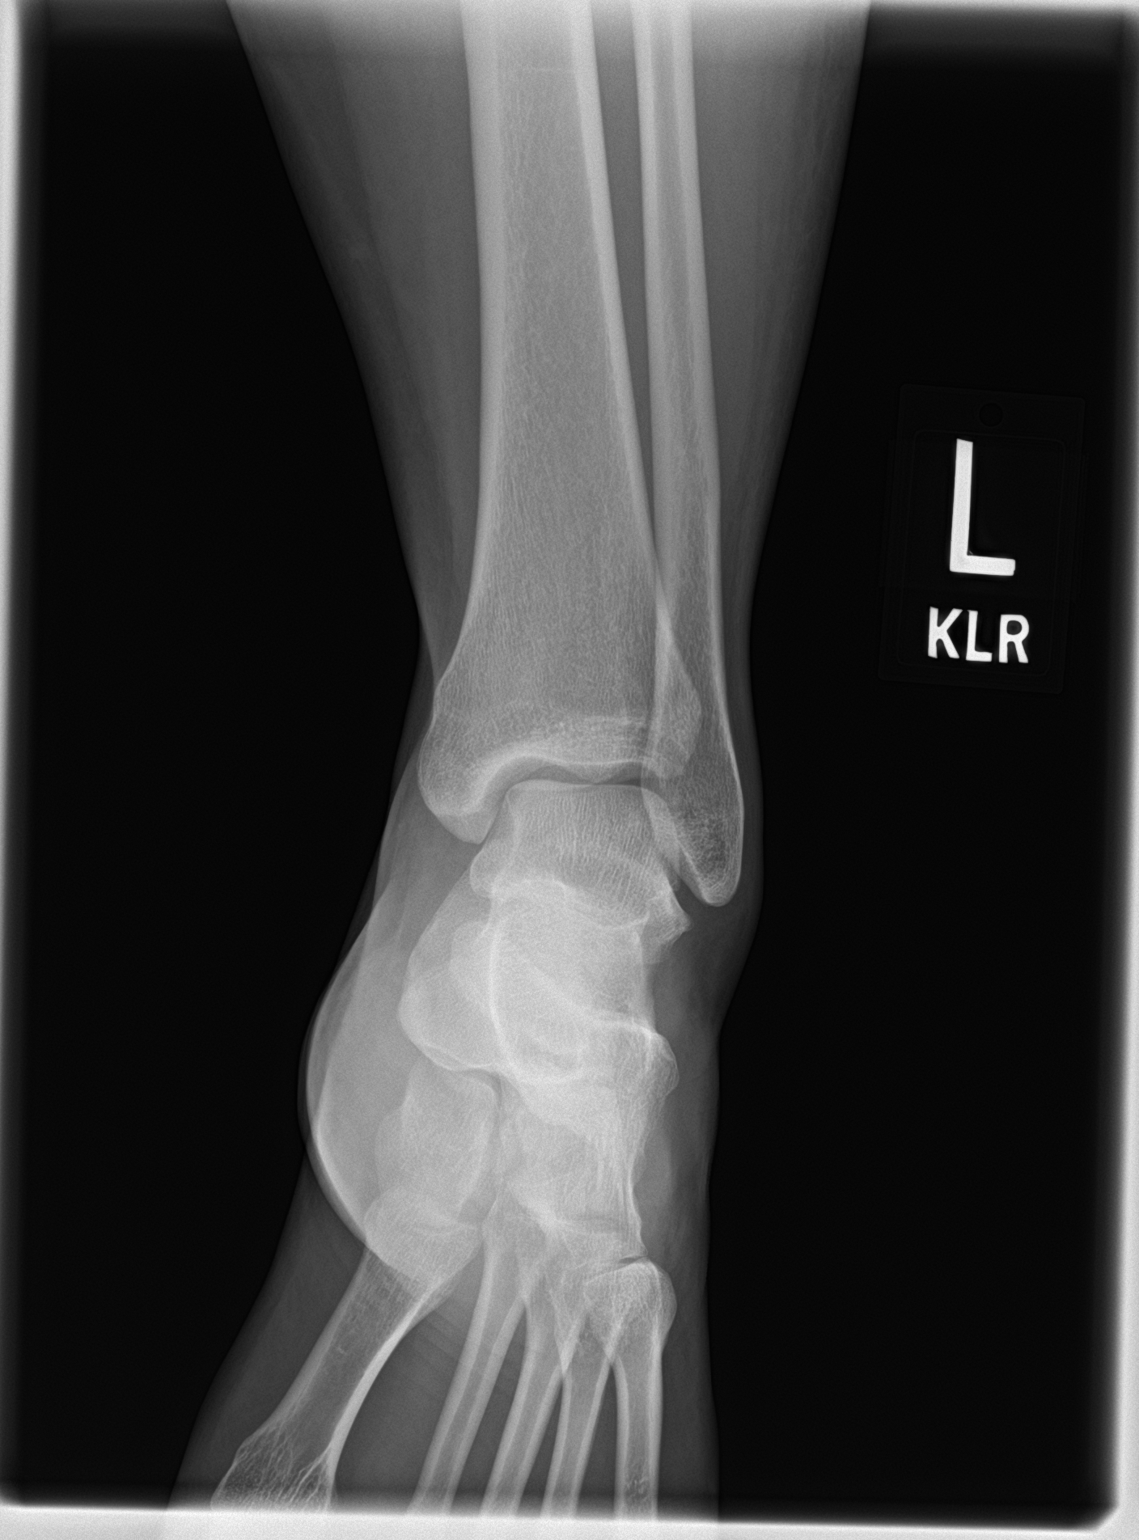
[im 2/3]
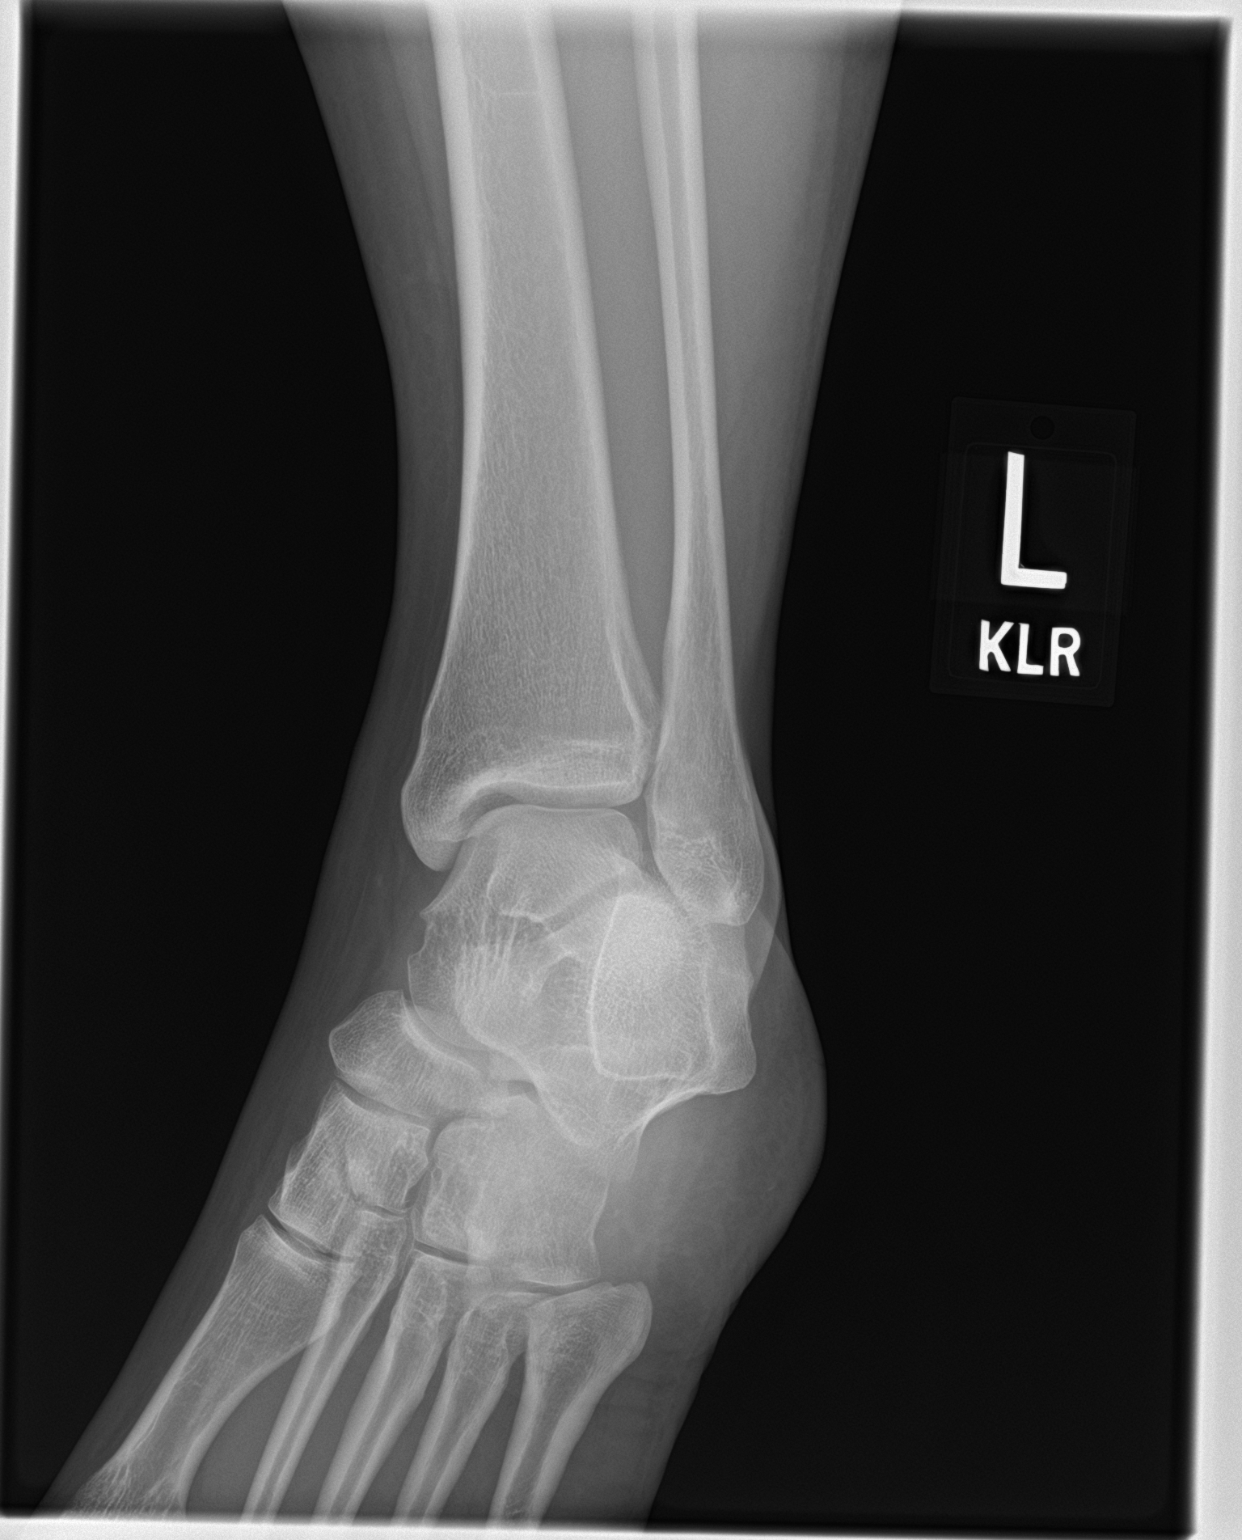
[im 3/3]
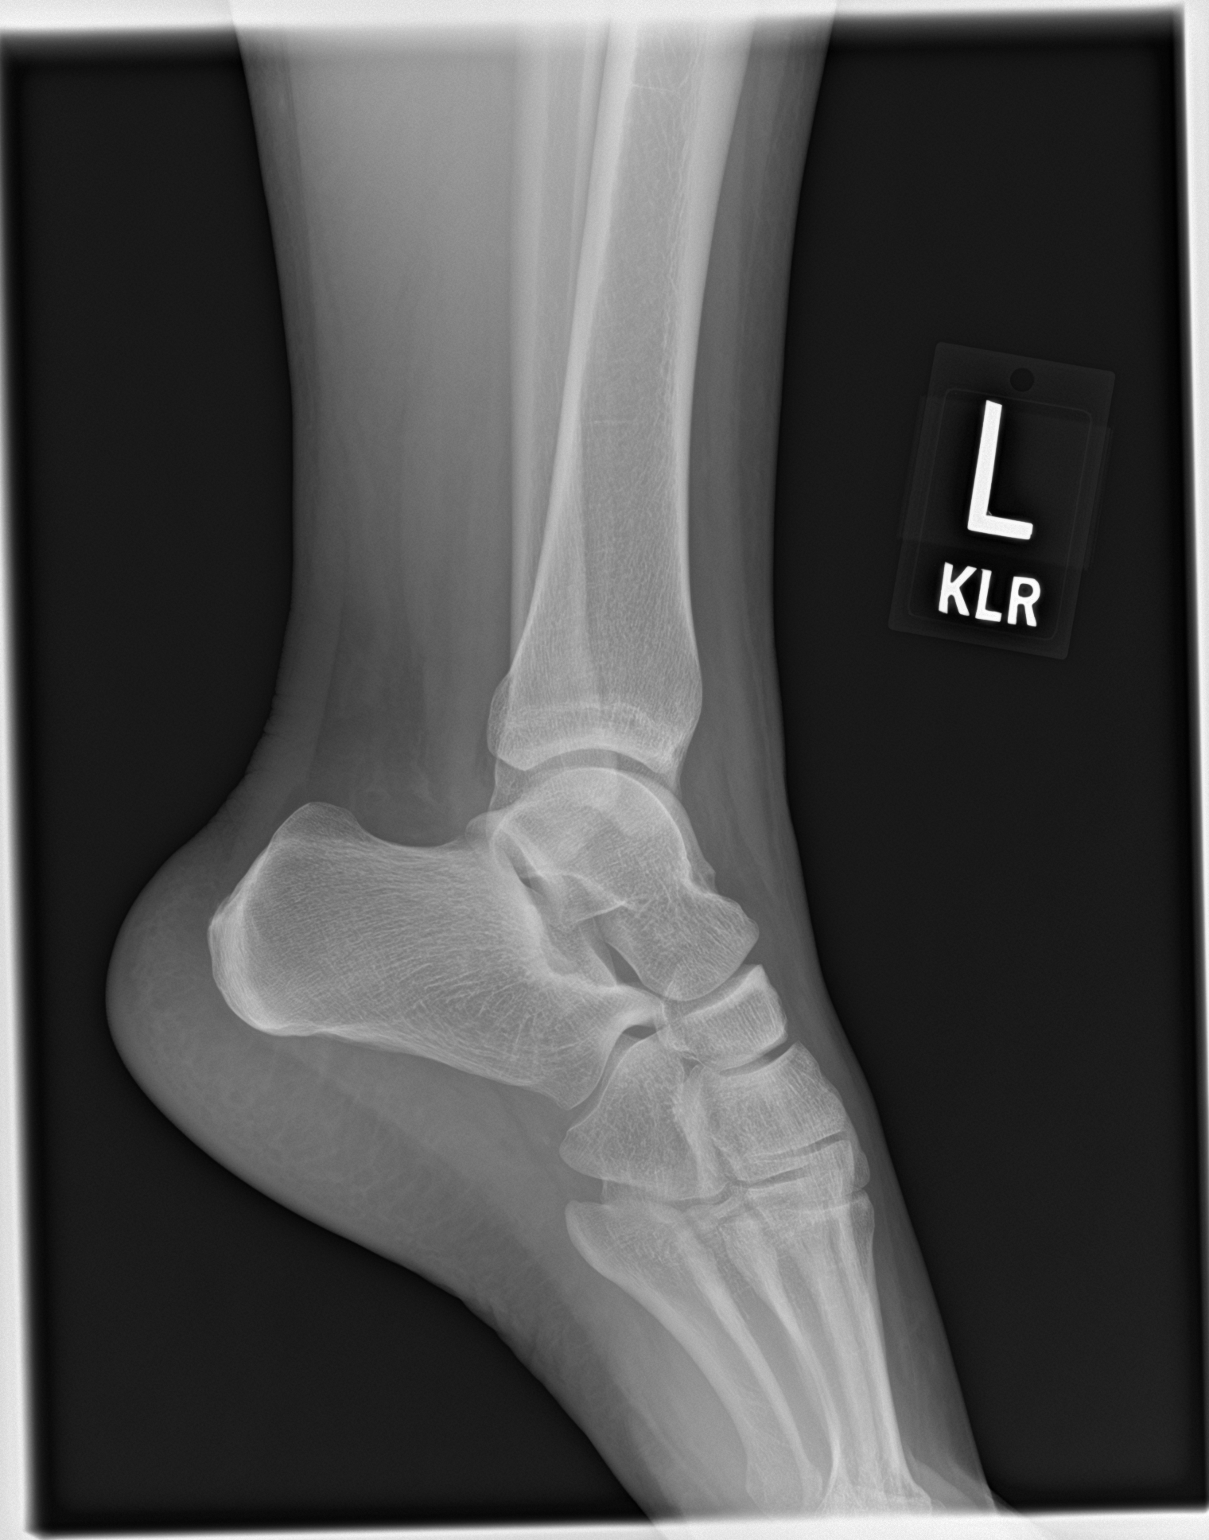

[3 of 3 positions shown; findings below may reference images not displayed]

FINDINGS: There is no evidence of fracture, dislocation, or joint effusion.
There is no evidence of arthropathy or other focal bone abnormality.
Soft tissues are unremarkable.
IMPRESSION: Negative.

## 2019-08-21 DIAGNOSIS — I361 Nonrheumatic tricuspid (valve) insufficiency: Secondary | ICD-10-CM

## 2019-08-21 DIAGNOSIS — I38 Endocarditis, valve unspecified: Secondary | ICD-10-CM

## 2019-08-21 DIAGNOSIS — A419 Sepsis, unspecified organism: Secondary | ICD-10-CM

## 2020-01-08 DIAGNOSIS — R509 Fever, unspecified: Secondary | ICD-10-CM

## 2020-01-08 DIAGNOSIS — F1110 Opioid abuse, uncomplicated: Secondary | ICD-10-CM

## 2020-02-05 ENCOUNTER — Other Ambulatory Visit: Payer: Self-pay

## 2020-02-05 ENCOUNTER — Inpatient Hospital Stay (HOSPITAL_COMMUNITY)
Admission: EM | Admit: 2020-02-05 | Discharge: 2020-02-07 | DRG: 539 | Discharge status: Against Medical Advice | Payer: Self-pay | Attending: Internal Medicine | Admitting: Internal Medicine

## 2020-02-05 ENCOUNTER — Encounter (HOSPITAL_COMMUNITY): Payer: Self-pay | Admitting: Emergency Medicine

## 2020-02-05 DIAGNOSIS — M4624 Osteomyelitis of vertebra, thoracic region: Principal | ICD-10-CM | POA: Diagnosis present

## 2020-02-05 DIAGNOSIS — Z5329 Procedure and treatment not carried out because of patient's decision for other reasons: Secondary | ICD-10-CM | POA: Diagnosis present

## 2020-02-05 DIAGNOSIS — F17210 Nicotine dependence, cigarettes, uncomplicated: Secondary | ICD-10-CM | POA: Diagnosis present

## 2020-02-05 DIAGNOSIS — Z880 Allergy status to penicillin: Secondary | ICD-10-CM

## 2020-02-05 DIAGNOSIS — F1990 Other psychoactive substance use, unspecified, uncomplicated: Secondary | ICD-10-CM | POA: Diagnosis not present

## 2020-02-05 DIAGNOSIS — Z20822 Contact with and (suspected) exposure to covid-19: Secondary | ICD-10-CM | POA: Diagnosis present

## 2020-02-05 DIAGNOSIS — Z888 Allergy status to other drugs, medicaments and biological substances status: Secondary | ICD-10-CM

## 2020-02-05 DIAGNOSIS — Z9114 Patient's other noncompliance with medication regimen: Secondary | ICD-10-CM

## 2020-02-05 DIAGNOSIS — G062 Extradural and subdural abscess, unspecified: Secondary | ICD-10-CM | POA: Diagnosis present

## 2020-02-05 DIAGNOSIS — F319 Bipolar disorder, unspecified: Secondary | ICD-10-CM | POA: Diagnosis present

## 2020-02-05 DIAGNOSIS — F1513 Other stimulant abuse with withdrawal: Secondary | ICD-10-CM | POA: Diagnosis present

## 2020-02-05 DIAGNOSIS — M4644 Discitis, unspecified, thoracic region: Secondary | ICD-10-CM

## 2020-02-05 DIAGNOSIS — F4310 Post-traumatic stress disorder, unspecified: Secondary | ICD-10-CM | POA: Diagnosis present

## 2020-02-05 NOTE — ED Notes (Signed)
The pt came in by [facility]  From home  Pt telling them that  She had an infection in her spine  Iv heroin use  The pt was sent to triage  But when they arrived the pt became upset  And was calling someone to come get her and leave if she was not going back to a room

## 2020-02-05 NOTE — ED Notes (Signed)
Pt in waiting room on phone talking loudly and cussing stating she cannot sit here that she is in severe pain and she is leaving if she does not get back soon. Pt has asked about wait time and the wait process has been explained, this NT apologized for extended wait time.

## 2020-02-05 NOTE — ED Triage Notes (Signed)
Pt reports she was suppose to have surgery on an abscess in her back, but "freaked out" and left the hospital in [place] prior to the surgery a month ago.  Pain in uncontrollable today.

## 2020-02-06 ENCOUNTER — Emergency Department (HOSPITAL_COMMUNITY): Payer: Self-pay

## 2020-02-06 DIAGNOSIS — F1990 Other psychoactive substance use, unspecified, uncomplicated: Secondary | ICD-10-CM

## 2020-02-06 DIAGNOSIS — F319 Bipolar disorder, unspecified: Secondary | ICD-10-CM

## 2020-02-06 DIAGNOSIS — M4624 Osteomyelitis of vertebra, thoracic region: Principal | ICD-10-CM

## 2020-02-06 LAB — CBC WITH DIFFERENTIAL/PLATELET
Abs Immature Granulocytes: 0.01 10*3/uL (ref 0.00–0.07)
Basophils Absolute: 0 10*3/uL (ref 0.0–0.1)
Basophils Relative: 0 %
Eosinophils Absolute: 0.1 10*3/uL (ref 0.0–0.5)
Eosinophils Relative: 1 %
HCT: 35.1 % — ABNORMAL LOW (ref 36.0–46.0)
Hemoglobin: 11.3 g/dL — ABNORMAL LOW (ref 12.0–15.0)
Immature Granulocytes: 0 %
Lymphocytes Relative: 28 %
Lymphs Abs: 1.7 10*3/uL (ref 0.7–4.0)
MCH: 28.3 pg (ref 26.0–34.0)
MCHC: 32.2 g/dL (ref 30.0–36.0)
MCV: 87.8 fL (ref 80.0–100.0)
Monocytes Absolute: 0.5 10*3/uL (ref 0.1–1.0)
Monocytes Relative: 8 %
Neutro Abs: 3.9 10*3/uL (ref 1.7–7.7)
Neutrophils Relative %: 63 %
Platelets: 246 10*3/uL (ref 150–400)
RBC: 4 MIL/uL (ref 3.87–5.11)
RDW: 14 % (ref 11.5–15.5)
WBC: 6.2 10*3/uL (ref 4.0–10.5)
nRBC: 0 % (ref 0.0–0.2)

## 2020-02-06 LAB — BASIC METABOLIC PANEL
Anion gap: 8 (ref 5–15)
BUN: <5 mg/dL — ABNORMAL LOW (ref 6–20)
CO2: 28 mmol/L (ref 22–32)
Calcium: 9 mg/dL (ref 8.9–10.3)
Chloride: 102 mmol/L (ref 98–111)
Creatinine, Ser: 0.63 mg/dL (ref 0.44–1.00)
GFR calc Af Amer: >60 mL/min (ref >60)
GFR calc non Af Amer: >60 mL/min (ref >60)
Glucose, Bld: 119 mg/dL — ABNORMAL HIGH (ref 70–99)
Potassium: 3.9 mmol/L (ref 3.5–5.1)
Sodium: 138 mmol/L (ref 135–145)

## 2020-02-06 LAB — URINALYSIS, ROUTINE W REFLEX MICROSCOPIC
Bilirubin Urine: NEGATIVE
Glucose, UA: NEGATIVE mg/dL
Hgb urine dipstick: NEGATIVE
Ketones, ur: NEGATIVE mg/dL
Nitrite: NEGATIVE
Protein, ur: NEGATIVE mg/dL
Specific Gravity, Urine: 1.009 (ref 1.005–1.030)
pH: 8 (ref 5.0–8.0)

## 2020-02-06 LAB — LACTIC ACID, PLASMA
Lactic Acid, Venous: 1.1 mmol/L (ref 0.5–1.9)
Lactic Acid, Venous: 1 mmol/L (ref 0.5–1.9)

## 2020-02-06 LAB — RAPID URINE DRUG SCREEN, HOSP PERFORMED
Amphetamines: NOT DETECTED
Barbiturates: NOT DETECTED
Benzodiazepines: NOT DETECTED
Cocaine: NOT DETECTED
Opiates: NOT DETECTED
Tetrahydrocannabinol: NOT DETECTED

## 2020-02-06 LAB — HIV ANTIBODY (ROUTINE TESTING W REFLEX): HIV Screen 4th Generation wRfx: NONREACTIVE

## 2020-02-06 LAB — SARS CORONAVIRUS 2 BY RT PCR (HOSPITAL ORDER, PERFORMED IN ~~LOC~~ HOSPITAL LAB): SARS Coronavirus 2: NEGATIVE

## 2020-02-06 MED ORDER — DICYCLOMINE HCL 20 MG PO TABS
20.0000 mg | ORAL_TABLET | Freq: Four times a day (QID) | ORAL | Status: DC | PRN
  Filled 2020-02-06: qty 1

## 2020-02-06 MED ORDER — CLONIDINE HCL 0.1 MG PO TABS: 0.1000 mg | ORAL_TABLET | Freq: Every day | ORAL | Status: DC

## 2020-02-06 MED ORDER — ONDANSETRON HCL 4 MG PO TABS: 4.0000 mg | ORAL_TABLET | Freq: Four times a day (QID) | ORAL | Status: DC | PRN

## 2020-02-06 MED ORDER — LOPERAMIDE HCL 2 MG PO CAPS: 2.0000 mg | ORAL_CAPSULE | ORAL | Status: DC | PRN

## 2020-02-06 MED ORDER — ONDANSETRON HCL 4 MG/2ML IJ SOLN: 4.0000 mg | Freq: Four times a day (QID) | INTRAMUSCULAR | Status: DC | PRN

## 2020-02-06 MED ORDER — CLONIDINE HCL 0.1 MG PO TABS: 0.1000 mg | ORAL_TABLET | ORAL | Status: DC

## 2020-02-06 MED ORDER — NAPROXEN 250 MG PO TABS: 500.0000 mg | ORAL_TABLET | Freq: Two times a day (BID) | ORAL | Status: DC | PRN

## 2020-02-06 MED ORDER — VANCOMYCIN HCL 1500 MG/300ML IV SOLN
1500.0000 mg | Freq: Once | INTRAVENOUS | Status: AC
  Administered 2020-02-06: 1500 mg via INTRAVENOUS
  Filled 2020-02-06 (×2): qty 300

## 2020-02-06 MED ORDER — GADOBUTROL 1 MMOL/ML IV SOLN
7.5000 mL | Freq: Once | INTRAVENOUS | Status: AC | PRN
  Administered 2020-02-06: 7.5 mL via INTRAVENOUS

## 2020-02-06 MED ORDER — SODIUM CHLORIDE 0.9 % IV SOLN
2.0000 g | Freq: Three times a day (TID) | INTRAVENOUS | Status: DC
  Administered 2020-02-06 – 2020-02-07 (×4): 2 g via INTRAVENOUS
  Filled 2020-02-06 (×6): qty 2

## 2020-02-06 MED ORDER — ENOXAPARIN SODIUM 40 MG/0.4ML ~~LOC~~ SOLN: 40.0000 mg | SUBCUTANEOUS | Status: DC

## 2020-02-06 MED ORDER — LORAZEPAM 2 MG/ML IJ SOLN
INTRAMUSCULAR | Status: AC
  Administered 2020-02-06: 2 mg via INTRAVENOUS
  Filled 2020-02-06: qty 1

## 2020-02-06 MED ORDER — ALBUTEROL SULFATE (2.5 MG/3ML) 0.083% IN NEBU: 2.5000 mg | INHALATION_SOLUTION | Freq: Four times a day (QID) | RESPIRATORY_TRACT | Status: DC | PRN

## 2020-02-06 MED ORDER — VANCOMYCIN HCL IN DEXTROSE 1-5 GM/200ML-% IV SOLN
1000.0000 mg | Freq: Three times a day (TID) | INTRAVENOUS | Status: DC
  Administered 2020-02-06 – 2020-02-07 (×2): 1000 mg via INTRAVENOUS
  Filled 2020-02-06 (×4): qty 200

## 2020-02-06 MED ORDER — KETOROLAC TROMETHAMINE 30 MG/ML IJ SOLN
60.0000 mg | Freq: Once | INTRAMUSCULAR | Status: AC
  Administered 2020-02-06: 60 mg via INTRAMUSCULAR
  Filled 2020-02-06: qty 2

## 2020-02-06 MED ORDER — ACETAMINOPHEN 325 MG PO TABS: 650.0000 mg | ORAL_TABLET | Freq: Four times a day (QID) | ORAL | Status: DC | PRN

## 2020-02-06 MED ORDER — HYDROXYZINE HCL 25 MG PO TABS
25.0000 mg | ORAL_TABLET | Freq: Four times a day (QID) | ORAL | Status: DC | PRN
  Administered 2020-02-06: 25 mg via ORAL
  Filled 2020-02-06: qty 1

## 2020-02-06 MED ORDER — CLONIDINE HCL 0.1 MG PO TABS
0.1000 mg | ORAL_TABLET | Freq: Four times a day (QID) | ORAL | Status: DC
  Administered 2020-02-06 – 2020-02-07 (×3): 0.1 mg via ORAL
  Filled 2020-02-06 (×4): qty 1

## 2020-02-06 MED ORDER — ACETAMINOPHEN 650 MG RE SUPP: 650.0000 mg | Freq: Four times a day (QID) | RECTAL | Status: DC | PRN

## 2020-02-06 MED ORDER — KETOROLAC TROMETHAMINE 30 MG/ML IJ SOLN
30.0000 mg | Freq: Four times a day (QID) | INTRAMUSCULAR | Status: DC | PRN
  Administered 2020-02-06 – 2020-02-07 (×4): 30 mg via INTRAVENOUS
  Filled 2020-02-06 (×4): qty 1

## 2020-02-06 MED ORDER — LORAZEPAM 2 MG/ML IJ SOLN: 2.0000 mg | Freq: Once | INTRAMUSCULAR | Status: AC

## 2020-02-06 MED ORDER — METHOCARBAMOL 500 MG PO TABS
500.0000 mg | ORAL_TABLET | Freq: Three times a day (TID) | ORAL | Status: DC | PRN
  Administered 2020-02-06: 500 mg via ORAL
  Filled 2020-02-06: qty 1

## 2020-02-06 NOTE — H&P (Addendum)
History and Physical    Tomisha Reppucci JKD:[id number] DOB: 10/11/1990 DOA: 02/05/2020  Referring MD/NP/PA: Claude Manges, PA-C PCP: Patient, No Pcp Per  Patient coming from: Home  Chief Complaint: Back pain  I have personally briefly reviewed patient's old medical records in [facility]   HPI: Ravleen Ries is a 29 y.o. female with medical history significant of IVDU(methamphetamine) and bipolar 1 disorder presents with complaints of back pain for the last month.  She had been previously hospitalized at [facility] told she had an abscess of her back which they transferred her to [facility] in [place] where they had planned to perform surgery.  However, she reports that she was too far away from her family and freaked out and left AGAINST MEDICAL ADVICE.  Since that time she has been having these intermittent fevers and palpitations.  Denies having any chest pain, nausea, vomiting, diarrhea, lower extremity weakness, urinary incontinence, saddle anesthesia, or bowel or bladder incontinence.  Reports that she has not injected any IV methamphetamine into her body for 3 months now.  She did admit to using grandmother's oxycodone prescription however recently due to severe pain.  Patient was only reported injecting methamphetamines into her arms and never checked it to her neck or back.  ED Course: On admission patient was noted to be afebrile with mild tachycardia and tachypnea.  Labs significant for hemoglobin 11.3.  Lactic acid and all other labs appeared relatively within normal limits.  Urine drug screen was also noted to be negative.  MRI of the back significant for osteomyelitis/discitis of T7-8 progression with small left ventral epidural abscess mildly deforms the cord.  Neurosurgery has been consulted, but recommended medical management.  Patient having given 2 g of Ativan prior to MRI, and 60 mg of Toradol while in the ED.  [facility] called to admit.  Review of Systems    Constitutional: Positive for fever and malaise/fatigue.  HENT: Negative for congestion and sinus pain.   Eyes: Negative for photophobia and pain.  Respiratory: Negative for cough and shortness of breath.   Cardiovascular: Negative for chest pain and leg swelling.  Gastrointestinal: Negative for abdominal pain, diarrhea, nausea and vomiting.  Genitourinary: Negative for dysuria and hematuria.  Musculoskeletal: Positive for back pain.  Neurological: Negative for focal weakness and loss of consciousness.  Endo/Heme/Allergies: Bruises/bleeds easily.  Psychiatric/Behavioral: Negative for substance abuse.    Past Medical History:  Diagnosis Date  . Back pain, chronic   . Bipolar 1 disorder (HCC)   . Constipation   . Pneumonia   . Prolapsed uterus     Past Surgical History:  Procedure Laterality Date  . ABDOMINAL HYSTERECTOMY    . APPENDECTOMY    . TUBAL LIGATION    . WISDOM TOOTH EXTRACTION       reports that she has been smoking. She has never used smokeless tobacco. She reports current drug use. Drug: Methamphetamines. She reports that she does not drink alcohol.  Allergies  Allergen Reactions  . Azithromycin Hives  . Ciprofloxacin Hives  . Morphine And Related     Hives, and "it was really bad"  . Prednisone Other (See Comments)    "makes face get red and feel hot"  . Penicillins Other (See Comments)     Has patient had a PCN reaction causing immediate rash, facial/tongue/throat swelling, SOB or lightheadedness with hypotension: yes Has patient had a PCN reaction causing severe rash involving mucus membranes or skin necrosis:  no Has patient had a  PCN reaction that required hospitalization: no Has patient had a PCN reaction occurring within the last 10 years: yes If all of the above answers are "NO", then may proceed with Cephalosporin use.     No family history on file.  Prior to Admission medications   Medication Sig Start Date End Date Taking? Authorizing  Provider  albuterol (PROVENTIL HFA;VENTOLIN HFA) 108 (90 Base) MCG/ACT inhaler Inhale 2-4 puffs by mouth every 4 hours as needed for wheezing, cough, and/or shortness of breath Patient not taking: Reported on 08/03/2017 05/27/17   Loleta Rose, MD    Physical Exam:  Constitutional: Young female easily arousable Vitals:   02/06/20 0230 02/06/20 0245 02/06/20 0555 02/06/20 0900  BP: 119/73 116/85 112/73 109/64  Pulse:   81 73  Resp: (!) 21 16 16    Temp:      TempSrc:      SpO2:   98% 98%  Weight:      Height:       Eyes: PERRL, lids and conjunctivae normal ENMT: Mucous membranes are moist. Posterior pharynx clear of any exudate or lesions. Poor dentition with multiple dental caries. Neck: normal, supple, no masses, no thyromegaly Respiratory: clear to auscultation bilaterally, no wheezing, no crackles. Normal respiratory effort. No accessory muscle use.  Cardiovascular: Regular rate and rhythm, no murmurs / rubs / gallops. No extremity edema. 2+ pedal pulses. No carotid bruits.  Abdomen: no tenderness, no masses palpated. No hepatosplenomegaly. Bowel sounds positive.  Musculoskeletal: no clubbing / cyanosis.  Significant tenderness palpation of the thoracic vertebral spine.   Skin: Multiple areas of bruising on the bilateral upper extremities from previous extraction sites appreciated Neurologic: CN 2-12 grossly intact. Sensation intact, DTR normal. Strength 5/5 in all 4.  Psychiatric: Normal judgment and insight.  Lethargic, but otherwise  oriented x 3. Normal mood.     Labs on Admission: I have personally reviewed following labs and imaging studies  CBC: Recent Labs  Lab 02/06/20 0403  WBC 6.2  NEUTROABS 3.9  HGB 11.3*  HCT 35.1*  MCV 87.8  PLT 246   Basic Metabolic Panel: Recent Labs  Lab 02/06/20 0403  NA 138  K 3.9  CL 102  CO2 28  GLUCOSE 119*  BUN <5*  CREATININE 0.63  CALCIUM 9.0   GFR: Estimated Creatinine Clearance: 100.9 mL/min (by C-G formula based  on SCr of 0.63 mg/dL). Liver Function Tests: No results for input(s): AST, ALT, ALKPHOS, BILITOT, PROT, ALBUMIN in the last 168 hours. No results for input(s): LIPASE, AMYLASE in the last 168 hours. No results for input(s): AMMONIA in the last 168 hours. Coagulation Profile: No results for input(s): INR, PROTIME in the last 168 hours. Cardiac Enzymes: No results for input(s): CKTOTAL, CKMB, CKMBINDEX, TROPONINI in the last 168 hours. BNP (last 3 results) No results for input(s): PROBNP in the last 8760 hours. HbA1C: No results for input(s): HGBA1C in the last 72 hours. CBG: No results for input(s): GLUCAP in the last 168 hours. Lipid Profile: No results for input(s): CHOL, HDL, LDLCALC, TRIG, CHOLHDL, LDLDIRECT in the last 72 hours. Thyroid Function Tests: No results for input(s): TSH, T4TOTAL, FREET4, T3FREE, THYROIDAB in the last 72 hours. Anemia Panel: No results for input(s): VITAMINB12, FOLATE, FERRITIN, TIBC, IRON, RETICCTPCT in the last 72 hours. Urine analysis:    Component Value Date/Time   COLORURINE YELLOW 02/06/2020 1033   APPEARANCEUR HAZY (A) 02/06/2020 1033   LABSPEC 1.009 02/06/2020 1033   PHURINE 8.0 02/06/2020 1033   GLUCOSEU NEGATIVE  02/06/2020 1033   HGBUR NEGATIVE 02/06/2020 1033   BILIRUBINUR NEGATIVE 02/06/2020 1033   KETONESUR NEGATIVE 02/06/2020 1033   PROTEINUR NEGATIVE 02/06/2020 1033   UROBILINOGEN 0.2 04/13/2015 0244   NITRITE NEGATIVE 02/06/2020 1033   LEUKOCYTESUR LARGE (A) 02/06/2020 1033   Sepsis Labs: No results found for this or any previous visit (from the past 240 hour(s)).   Radiological Exams on Admission: MR THORACIC SPINE W WO CONTRAST  Result Date: 02/06/2020 CLINICAL DATA:  Heavy drug abuse with mid back pain EXAM: MRI THORACIC WITHOUT AND WITH CONTRAST TECHNIQUE: Multiplanar and multiecho pulse sequences of the thoracic spine were obtained without and with intravenous contrast. CONTRAST:  7.74mL GADAVIST GADOBUTROL 1 MMOL/ML IV  SOLN COMPARISON:  01/08/2020 FINDINGS: MRI THORACIC SPINE FINDINGS Alignment:  Normal Vertebrae: Progressive marrow edema and new endplate erosion at T7-8, with disc T2 hyperintensity. There is a new left ventral, paracentral epidural abscess measuring 1 cm in length and 2 mm in thickness. There is contact of the cord with mild cord deformity. Cord:  No cord edema or infarct Paraspinal and other soft tissues: Paravertebral phlegmon at the level of T7-8 infection has improved, with resolved paraspinal abscess. Disc levels: No degenerative changes. 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 Paravertebral infection on prior has improved, with resolved paravertebral abscess. Electronically Signed   By: Marnee Spring M.D.   On: 02/06/2020 07:56   MR Lumbar Spine W Wo Contrast  Result Date: 02/06/2020 CLINICAL DATA:  Back pain EXAM: MRI LUMBAR SPINE WITHOUT AND WITH CONTRAST TECHNIQUE: Multiplanar and multiecho pulse sequences of the lumbar spine were obtained without and with intravenous contrast. CONTRAST:  7.29mL GADAVIST GADOBUTROL 1 MMOL/ML IV SOLN COMPARISON:  None. FINDINGS: Segmentation:  Normal Alignment:  Normal Vertebrae:  No fracture, evidence of discitis, or bone lesion. Conus medullaris and cauda equina: Conus extends to the L1 level. Conus and cauda equina appear normal. Paraspinal and other soft tissues: Negative Disc levels: Mild narrowing and desiccation at L5-S1 with mild disc bulging. IMPRESSION: Negative for lumbar spine infection. Electronically Signed   By: Marnee Spring M.D.   On: 02/06/2020 08:20      Assessment/Plan Osteomyelitis thoracic spine with epidural abscess: Patient previously hospitalized at [facility] for similar symptoms they have been sent to [facility] for surgical intervention.  Patient however left AMA.  Presents back with complaints of severe back  pain.  MRI reveals T7-8 osteomyelitis discitis with epidural abscess.  Neurosurgery formally consulted, but recommended medical management. -Admit to a telemetry bed -Check blood cultures  -Requested records from [facility] -Empiric antibiotics of vancomycin and cefepime per pharmacy -Toradol IV as needed for pain  IV drug use/heroin abuse: Patient reports refraining from methamphetamine use for the last 3 months.  Nursing staff noted that patient had reported last using yesterday. -Clonidine opioid withdrawal protocol initiated -Counseled on need of cessation from IV and other drug use  History of Bipolar 1 disorder: Patient reports not being on any medications for treatment of this.  DVT prophylaxis: Lovenox Code Status: Full Disposition Plan: TBD Consults called: Neurosurgery Admission status: Inpatient   Clydie Braun MD [facility] Pager [phone]   If 7PM-7AM, please contact night-coverage [email or web address] Password [email or web address]  02/06/2020, 1:22 PM

## 2020-02-06 NOTE — ED Notes (Signed)
Patient transported to MRI 

## 2020-02-06 NOTE — ED Notes (Signed)
Pt irritable, verbally aggressive, argumentative, labile mood, expressing hopelessness, informs this nurse the last time she used heroin was yesterday and that she is going through withdrawal. This nurse notified Attending Katrinka Blazing MD.

## 2020-02-06 NOTE — ED Provider Notes (Addendum)
[facility] EMERGENCY DEPARTMENT Provider Note   CSN: [id number] Arrival date & time: 02/05/20  1841     History Chief Complaint  Patient presents with  . Back Pain    Sara Bradshaw is a 29 y.o. female with PMHx bipolar disorder who presents to the ED today with complaint of persistent mid/lower back pain x 1 month.  She reports that 1 month ago she went to [facility] and was told that she had an abscess in her back.  She states they transferred her to a hospital in [place] and wanted to perform surgery however her family could not come to the hospital and she "freaked out" and left. She states she has been dealing with the pain all this time at home and has been having intermittent subjective fevers since then.  Patient states she has been "clean" and has not used IV drugs in over 3 months.  She states she used to use methamphetamine.  Patient does report that she took her grandmothers oxycodone orally for the past 2 days due to significant pain.  She denies any weakness/numbness/tingling in her bilateral lower extremities, saddle anesthesia, urinary retention, urinary or bowel incontinence, any other associated symptoms.   The history is provided by the patient and medical records.       Past Medical History:  Diagnosis Date  . Back pain, chronic   . Bipolar 1 disorder (HCC)   . Constipation   . Pneumonia   . Prolapsed uterus     Patient Active Problem List   Diagnosis Date Noted  . 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 Suicidal ideation 08/04/2017  . PTSD (post-traumatic stress disorder) 08/04/2017    Past Surgical History:  Procedure Laterality Date  . ABDOMINAL HYSTERECTOMY    . APPENDECTOMY    . TUBAL LIGATION    . WISDOM TOOTH EXTRACTION       OB History    Gravida  4   Para  3   Term  2   Preterm  1   AB  1   Living  3     SAB  1   TAB      Ectopic      Multiple      Live Births  3            No family history on file.  Social History   Tobacco Use  . Smoking status: Current Every Day Smoker  . Smokeless tobacco: Never Used  Vaping Use  . Vaping Use: Never used  Substance Use Topics  . Alcohol use: No  . Drug use: Yes    Types: Methamphetamines    Comment: Molly     Home Medications Prior to Admission medications   Medication Sig Start Date End Date Taking? Authorizing Provider  albuterol (PROVENTIL HFA;VENTOLIN HFA) 108 (90 Base) MCG/ACT inhaler Inhale 2-4 puffs by mouth every 4 hours as needed for wheezing, cough, and/or shortness of breath Patient not taking: Reported on 08/03/2017 05/27/17   Loleta Rose, MD    Allergies    Azithromycin, Ciprofloxacin, Morphine and related, Prednisone, and Penicillins  Review of Systems   Review of Systems  Constitutional: Positive for fever (subjective).  Genitourinary: Negative for difficulty urinating.  Musculoskeletal: Positive for back pain.  Neurological: Negative for weakness and numbness.  All other systems reviewed and are negative.   Physical Exam Updated Vital Signs BP 112/73 (BP Location: Right Arm)   Pulse 81   Temp 98.1  F (36.7 C)   Resp 16   Ht 5\' 7"  (1.702 m)   Wt 68 kg   LMP 07/10/2014   SpO2 98%   BMI 23.49 kg/m   Physical Exam Vitals and nursing note reviewed.  HENT:     Head: Normocephalic and atraumatic.  Eyes:     Conjunctiva/sclera: Conjunctivae normal.  Cardiovascular:     Rate and Rhythm: Normal rate and regular rhythm.  Pulmonary:     Effort: Pulmonary effort is normal.     Breath sounds: Normal breath sounds. No wheezing, rhonchi or rales.  Abdominal:     Palpations: Abdomen is soft.     Tenderness: There is no abdominal tenderness. There is no guarding or rebound.  Musculoskeletal:     Cervical back: Neck supple.       Back:     Comments: No C or upper T midline spinal TTP. + Lower T and L midline spinal TTP. Moving all extremities without difficulty. Strength and  sensation intact to BUE and BLEs. 2+ distal pulses. 2+ patellar reflexes.   Multiple tract marks noted to [facility] fossa bilaterally  Skin:    General: Skin is warm and dry.  Neurological:     Mental Status: She is alert.     ED Results / Procedures / Treatments   Labs (all labs ordered are listed, but only abnormal results are displayed) Labs Reviewed  BASIC METABOLIC PANEL - Abnormal; Notable for the following components:      Result Value   Glucose, Bld 119 (*)    BUN <5 (*)    All other components within normal limits  CBC WITH DIFFERENTIAL/PLATELET - Abnormal; Notable for the following components:   Hemoglobin 11.3 (*)    HCT 35.1 (*)    All other components within normal limits  LACTIC ACID, PLASMA  LACTIC ACID, PLASMA    EKG None  Radiology No results found.  Procedures Procedures (including critical care time)  Medications Ordered in ED Medications  LORazepam (ATIVAN) injection 2 mg (2 mg Intravenous Given 02/06/20 0612)  ketorolac (TORADOL) 30 MG/ML injection 60 mg (60 mg Intramuscular Given 02/06/20 0511)    ED Course  I have reviewed the triage vital signs and the nursing notes.  Pertinent labs & imaging results that were available during my care of the patient were reviewed by me and considered in my medical decision making (see chart for details).    MDM Rules/Calculators/A&P                          29 year old female history of IV drug use who presents to the ED today complaining of middle back pain for the past 1 month.  Reports that she was told she had a spinal epidural abscess at a another facility last month and was planning for surgery however she left prior to surgery.  On arrival to the ED patient is afebrile.  She is noted to be mildly tachycardic in the low 100s.  Nontachypneic.  She is nontoxic-appearing however does appear uncomfortable on exam.  She has obvious midline lower T and lumbar midline spinal tenderness.  Will obtain MRI of T and  L-spine as I am unable to see records in outside system.  We will plan for lab work as well, will hold off on antibiotics at this time prior to diagnosis.   CBC without leukocytosis. Hgb stable at 11.3.  BMP without electrolyte abnormalities.  Lactic acid 1.0.  6:44 AM At shift change case signed out to [facility] [place], who will follow up on MRI and dispo accordingly.   Final Clinical Impression(s) / ED Diagnoses Final diagnoses:  None    Rx / DC Orders ED Discharge Orders    None         Tanda Rockers, PA-C 02/06/20 2505    Geoffery Lyons, MD 02/06/20 [phone]

## 2020-02-06 NOTE — ED Notes (Signed)
Attending Katrinka Blazing MD at bedside

## 2020-02-06 NOTE — Consult Note (Signed)
Reason for Consult: Discitis and osteomyelitis  Referring Physician: Claude MangesSoto, Johana, PA-C   Sara Bradshaw is an 29 y.o. female.  HPI: Sara Bradshaw is a 29 y.o. female who presented to the ED today with c/o lower back pain, h/o osteomyelitis and spinal abscess. She reported a history of IV drug use for which she stated she has abstained from using for the last three months. She had been previously hospitalized at [facility] and informed that she had an abscess which they then transferred her to [facility] in [place] where they had planned to perform surgery.  However, she reports that she was too far away from her family and freaked out and left AMA.  Since that time she has been having these intermittent fevers and palpitations. She did admit to using her grandmother's oxycodone prescription over the last two days due to severe pain.  Patient was only reported injecting methamphetamines into her arms and never checked it to her neck or back.  Past Medical History:  Diagnosis Date  . Back pain, chronic   . Bipolar 1 disorder (HCC)   . Constipation   . Pneumonia   . Prolapsed uterus     Past Surgical History:  Procedure Laterality Date  . ABDOMINAL HYSTERECTOMY    . APPENDECTOMY    . TUBAL LIGATION    . WISDOM TOOTH EXTRACTION      No family history on file.  Social History:  reports that she has been smoking. She has never used smokeless tobacco. She reports current drug use. Drug: Methamphetamines. She reports that she does not drink alcohol.  Allergies:  Allergies  Allergen Reactions  . Azithromycin Hives  . Ciprofloxacin Hives  . Morphine And Related     Hives, and "it was really bad"  . Prednisone Other (See Comments)    "makes face get red and feel hot"  . Penicillins Other (See Comments)     Has patient had a PCN reaction causing immediate rash, facial/tongue/throat swelling, SOB or lightheadedness with hypotension: yes Has patient had a PCN reaction causing  severe rash involving mucus membranes or skin necrosis:  no Has patient had a PCN reaction that required hospitalization: no Has patient had a PCN reaction occurring within the last 10 years: yes If all of the above answers are "NO", then may proceed with Cephalosporin use.     Medications: I have reviewed the patients PTA medications.  Results for orders placed or performed during the hospital encounter of 02/05/20 (from the past 48 hour(s))  Basic metabolic panel     Status: Abnormal   Collection Time: 02/06/20  4:03 AM  Result Value Ref Range   Sodium 138 135 - 145 mmol/L   Potassium 3.9 3.5 - 5.1 mmol/L   Chloride 102 98 - 111 mmol/L   CO2 28 22 - 32 mmol/L   Glucose, Bld 119 (H) 70 - 99 mg/dL    Comment: Glucose reference range applies only to samples taken after fasting for at least 8 hours.   BUN <5 (L) 6 - 20 mg/dL   Creatinine, Ser 0.980.63 0.44 - 1.00 mg/dL   Calcium 9.0 8.9 - 11.910.3 mg/dL   GFR calc non Af Amer >60 >60 mL/min   GFR calc Af Amer >60 >60 mL/min   Anion gap 8 5 - 15    Comment: Performed at [facility], [address] [place], [place] [id number]  CBC with Differential     Status: Abnormal  Collection Time: 02/06/20  4:03 AM  Result Value Ref Range   WBC 6.2 4.0 - 10.5 K/uL   RBC 4.00 3.87 - 5.11 MIL/uL   Hemoglobin 11.3 (L) 12.0 - 15.0 g/dL   HCT 07.3 (L) 36 - 46 %   MCV 87.8 80.0 - 100.0 fL   MCH 28.3 26.0 - 34.0 pg   MCHC 32.2 30.0 - 36.0 g/dL   RDW 71.0 62.6 - 94.8 %   Platelets 246 150 - 400 K/uL   nRBC 0.0 0.0 - 0.2 %   Neutrophils Relative % 63 %   Neutro Abs 3.9 1.7 - 7.7 K/uL   Lymphocytes Relative 28 %   Lymphs Abs 1.7 0.7 - 4.0 K/uL   Monocytes Relative 8 %   Monocytes Absolute 0.5 0 - 1 K/uL   Eosinophils Relative 1 %   Eosinophils Absolute 0.1 0 - 0 K/uL   Basophils Relative 0 %   Basophils Absolute 0.0 0 - 0 K/uL   Immature Granulocytes 0 %   Abs Immature Granulocytes 0.01 0.00 - 0.07 K/uL    Comment: Performed at [facility], [address], [place], [place] [address]  Lactic acid, plasma     Status: None   Collection Time: 02/06/20  4:03 AM  Result Value Ref Range   Lactic Acid, Venous 1.1 0.5 - 1.9 mmol/L    Comment: Performed at [facility], [address] [place], [place] [address]  Lactic acid, plasma     Status: None   Collection Time: 02/06/20  5:39 AM  Result Value Ref Range   Lactic Acid, Venous 1.0 0.5 - 1.9 mmol/L    Comment: Performed at [facility], [address] [place], [place] [address]  Rapid urine drug screen (hospital performed)     Status: None   Collection Time: 02/06/20 10:33 AM  Result Value Ref Range   Opiates NONE DETECTED NONE DETECTED   Cocaine NONE DETECTED NONE DETECTED   Benzodiazepines NONE DETECTED NONE DETECTED   Amphetamines NONE DETECTED NONE DETECTED   Tetrahydrocannabinol NONE DETECTED NONE DETECTED   Barbiturates NONE DETECTED NONE DETECTED    Comment: (NOTE) DRUG SCREEN FOR MEDICAL PURPOSES ONLY.  IF CONFIRMATION IS NEEDED FOR ANY PURPOSE, NOTIFY LAB WITHIN 5 DAYS.  LOWEST DETECTABLE LIMITS FOR URINE DRUG SCREEN Drug Class                     Cutoff (ng/mL) Amphetamine and metabolites    1000 Barbiturate and metabolites    200 Benzodiazepine                 200 Tricyclics and metabolites     300 Opiates and metabolites        300 Cocaine and metabolites        300 THC                            50 Performed at [facility], [address] [place], [place] [address]   Urinalysis, Routine w reflex microscopic     Status: Abnormal   Collection Time: 02/06/20 10:33 AM  Result Value Ref Range   Color, Urine YELLOW YELLOW   APPearance HAZY (A) CLEAR   Specific Gravity, Urine 1.009 1.005 - 1.030   pH 8.0 5.0 - 8.0   Glucose, UA NEGATIVE NEGATIVE mg/dL   Hgb urine dipstick NEGATIVE NEGATIVE   Bilirubin  Urine NEGATIVE NEGATIVE   Ketones, ur NEGATIVE NEGATIVE mg/dL   Protein, ur NEGATIVE NEGATIVE mg/dL   Nitrite NEGATIVE  NEGATIVE   Leukocytes,Ua LARGE (A) NEGATIVE   RBC / HPF 6-10 0 - 5 RBC/hpf   WBC, UA 11-20 0 - 5 WBC/hpf   Bacteria, UA RARE (A) NONE SEEN   Squamous Epithelial / LPF 6-10 0 - 5   Non Squamous Epithelial 0-5 (A) NONE SEEN    Comment: Performed at [facility], [address] [place], [place] [address]  SARS Coronavirus 2 by RT PCR (hospital order, performed in [facility] hospital lab) Nasopharyngeal Nasopharyngeal Swab     Status: None   Collection Time: 02/06/20  1:26 PM   Specimen: Nasopharyngeal Swab  Result Value Ref Range   SARS Coronavirus 2 NEGATIVE NEGATIVE    Comment: (NOTE) SARS-CoV-2 target nucleic acids are NOT DETECTED.  The SARS-CoV-2 RNA is generally detectable in upper and lower respiratory specimens during the acute phase of infection. The lowest concentration of SARS-CoV-2 viral copies this assay can detect is 250 copies / mL. A negative result does not preclude SARS-CoV-2 infection and should not be used as the sole basis for treatment or other patient management decisions.  A negative result may occur with improper specimen collection / handling, submission of specimen other than nasopharyngeal swab, presence of viral mutation(s) within the areas targeted by this assay, and inadequate number of viral copies (<250 copies / mL). A negative result must be combined with clinical observations, patient history, and epidemiological information.  Fact Sheet for Patients:   BoilerBrush.com.cy  Fact Sheet for Healthcare Providers: https://pope.com/  This test is not yet approved or  cleared by the [place] FDA and has been authorized for detection and/or diagnosis of SARS-CoV-2 by FDA under an Emergency Use Authorization (EUA).  This EUA will remain in effect (meaning this test can be used) for the duration of the COVID-19 declaration under Section 564(b)(1) of the Act, 21 U.S.C. section 360bbb-3(b)(1),  unless the authorization is terminated or revoked sooner.  Performed at [facility], [address] [place], [place] [address]   HIV Antibody (routine testing w rflx)     Status: None   Collection Time: 02/06/20  3:40 PM  Result Value Ref Range   HIV Screen 4th Generation wRfx Non Reactive Non Reactive    Comment: Performed at [facility], [address], [place], [place] [address]    MR THORACIC SPINE W WO CONTRAST  Result Date: 02/06/2020 CLINICAL DATA:  Heavy drug abuse with mid back pain EXAM: MRI THORACIC WITHOUT AND WITH CONTRAST TECHNIQUE: Multiplanar and multiecho pulse sequences of the thoracic spine were obtained without and with intravenous contrast. CONTRAST:  7.45mL GADAVIST GADOBUTROL 1 MMOL/ML IV SOLN COMPARISON:  01/08/2020 FINDINGS: MRI THORACIC SPINE FINDINGS Alignment:  Normal Vertebrae: Progressive marrow edema and new endplate erosion at T7-8, with disc T2 hyperintensity. There is a new left ventral, paracentral epidural abscess measuring 1 cm in length and 2 mm in thickness. There is contact of the cord with mild cord deformity. Cord:  No cord edema or infarct Paraspinal and other soft tissues: Paravertebral phlegmon at the level of T7-8 infection has improved, with resolved paraspinal abscess. Disc levels: No degenerative changes. 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 Paravertebral infection on prior has improved, with resolved paravertebral abscess. Electronically Signed   By: Marja Kays  Watts M.D.   On: 02/06/2020 07:56   MR Lumbar Spine W Wo Contrast  Result Date: 02/06/2020 CLINICAL DATA:  Back pain EXAM: MRI LUMBAR SPINE WITHOUT AND WITH CONTRAST TECHNIQUE: Multiplanar and multiecho pulse sequences of the lumbar spine were obtained without and with intravenous contrast. CONTRAST:  7.79mL GADAVIST GADOBUTROL 1 MMOL/ML IV SOLN COMPARISON:  None.  FINDINGS: Segmentation:  Normal Alignment:  Normal Vertebrae:  No fracture, evidence of discitis, or bone lesion. Conus medullaris and cauda equina: Conus extends to the L1 level. Conus and cauda equina appear normal. Paraspinal and other soft tissues: Negative Disc levels: Mild narrowing and desiccation at L5-S1 with mild disc bulging. IMPRESSION: Negative for lumbar spine infection. Electronically Signed   By: Marnee Spring M.D.   On: 02/06/2020 08:20    Review of Systems -Per HPI  Blood pressure 109/63, pulse 83, temperature 98.1 F (36.7 C), resp. rate 16, height 5\' 7"  (1.702 m), weight 68 kg, last menstrual period 07/10/2014, SpO2 100 %. Physical Exam Neurological:     Mental Status: She is oriented to person, place, and time. She is lethargic.     GCS: GCS eye subscore is 3. GCS verbal subscore is 5. GCS motor subscore is 6.     Cranial Nerves: Cranial nerves are intact.     Sensory: Sensation is intact.     Motor: Motor function is intact.   On exam, the patient appeared very lethargic with poor attention and poor cooperation. She did arouse enough to participate in a basic neurological examination. She MAEW with good strength that is symmetric bilaterally.   Assessment/Plan: Lumbar MRI was unremarkable. Thoracic MRI significant for discitis and osteomyelitis at T7 and T8. She has no significant epidural abscess and has no spinal cord compression. She is not a surgical candidate nor do I have any neurosurgical treatment to offer the patient. I have recommended admission to the medical team for medical management and treatment with the appropriate antibiotics. Call with any questions.  07/12/2014, MD 02/06/2020, 5:30 PM

## 2020-02-06 NOTE — Progress Notes (Signed)
  Pharmacy Antibiotic Note  Sara Bradshaw is a 29 y.o. 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 Pharmacy has been consulted for vancomycin and cefepime dosing.  Tmax 98.1, HR 90, BP 112.73, WBC 6.2, Lactic acid 1.0, SCr 0.63, CrCl 100.24mL/min  Allergy to penicillins listed but she has tolerated cephalexin and ceftriaxone in the past  Lumbar MRI is negative for signs of infection, thoracic MRI shows worsening osteomyelitis and small ventral epidural abscess. Paravertebral infection and abscess have improved.   Plan: Vancomycin 1500mg  IV x1 then vancomycin 1gm IV every 8 hours.  Goal trough 15-20 mcg/mL.  Cefepime 2gm every 8 hours.  Monitor labs, cultures, clinical progress and vancomycin trough as needed.  Height: 5\' 7"  (170.2 cm) Weight: 68 kg (150 lb) IBW/kg (Calculated) : 61.6  Temp (24hrs), Avg:97.8 F (36.6 C), Min:97.4 F (36.3 C), Max:98.1 F (36.7 C)  Recent Labs  Lab 02/06/20 0403 02/06/20 0539  WBC 6.2  --   CREATININE 0.63  --   LATICACIDVEN 1.1 1.0    Estimated Creatinine Clearance: 100.9 mL/min (by C-G formula based on SCr of 0.63 mg/dL).    Allergies  Allergen Reactions  . Azithromycin Hives  . Ciprofloxacin Hives  . Morphine And Related     Hives, and "it was really bad"  . Prednisone Other (See Comments)    "makes face get red and feel hot"  . Penicillins Other (See Comments)     Has patient had a PCN reaction causing immediate rash, facial/tongue/throat swelling, SOB or lightheadedness with hypotension: yes Has patient had a PCN reaction causing severe rash involving mucus membranes or skin necrosis:  no Has patient had a PCN reaction that required hospitalization: no Has patient had a PCN reaction occurring within the last 10 years: yes If all of the above answers are "NO", then may proceed with Cephalosporin use.     Antimicrobials this admission: 7/11 vancomycin >>>  7/11  cefepime >>>  Dose adjustments this admission:  Microbiology results: 7/11 BCx: pending  9/11, PharmD PGY1 Acute Care Pharmacy Resident Please refer to [facility] for unit-specific pharmacist

## 2020-02-06 NOTE — ED Provider Notes (Signed)
  Physical Exam  BP 109/64   Pulse 73   Temp 98.1 F (36.7 C)   Resp 16   Ht 5\' 7"  (1.702 m)   Wt 68 kg   LMP 07/10/2014   SpO2 98%   BMI 23.49 kg/m   Physical Exam  ED Course/Procedures     Procedures  MDM  Patient care assumed from Margaux PA at shift change, please see her note for full HPI.  Briefly, patient here with a past medical history of epidural abscess, diagnosed at a prior facility about a month ago.  Has had significant back pain for the past 2 days taking grandmothers oxycodone without improvement in her pain.  No weakness, numbness, tingling no saddle anesthesia no urinary or bowel complaints.  Blood work is at her baseline, prior history of IV drug use.  She is pending MRI of the T-spine and lumbar spine and proper disposition. MR Thoracic spine: Discitis osteomyelitis at T7-8 that has progressed from 01/08/2020,  with small left ventral epidural abscess which mildly deforms the  cord. Paravertebral infection on prior has improved, with resolved  paravertebral abscess.   Call placed to neurosurgery on call for further recommendations.   10:17 AM patient was evaluated by neurosurgery PA while in the ED, patient appears very somnolent, she did participate on exam.  She has only received a shot of Toradol for pain while in the ED, reports taking her grandmothers hydrocodone at home.  Will obtain UDS for further screening.  Case to be discussed with Dr. 03/09/2020 who is currently in surgery, awaiting disposition.  12:51 PM Spoke to neurosurgery, who recommended medical management. Call placed for hospitalist admission.   1:26 PM Spoke to Dr. Venetia Maxon who will admit patient for further management.    Portions of this note were generated with Katrinka Blazing. Dictation errors may occur despite best attempts at proofreading.        Scientist, clinical (histocompatibility and immunogenetics), PA-C 02/06/20 1327    04/08/20, MD 02/08/20 1459

## 2020-02-07 ENCOUNTER — Inpatient Hospital Stay (HOSPITAL_COMMUNITY): Payer: Self-pay

## 2020-02-07 ENCOUNTER — Encounter (HOSPITAL_COMMUNITY): Payer: Self-pay | Admitting: Internal Medicine

## 2020-02-07 DIAGNOSIS — I509 Heart failure, unspecified: Secondary | ICD-10-CM

## 2020-02-07 DIAGNOSIS — G062 Extradural and subdural abscess, unspecified: Secondary | ICD-10-CM

## 2020-02-07 DIAGNOSIS — M4644 Discitis, unspecified, thoracic region: Secondary | ICD-10-CM

## 2020-02-07 LAB — C-REACTIVE PROTEIN: CRP: 2.8 mg/dL — ABNORMAL HIGH (ref <1.0)

## 2020-02-07 LAB — COMPREHENSIVE METABOLIC PANEL
ALT: 11 U/L (ref 0–44)
AST: 12 U/L — ABNORMAL LOW (ref 15–41)
Albumin: 3.1 g/dL — ABNORMAL LOW (ref 3.5–5.0)
Alkaline Phosphatase: 53 U/L (ref 38–126)
Anion gap: 9 (ref 5–15)
BUN: 14 mg/dL (ref 6–20)
CO2: 23 mmol/L (ref 22–32)
Calcium: 8.9 mg/dL (ref 8.9–10.3)
Chloride: 107 mmol/L (ref 98–111)
Creatinine, Ser: 0.66 mg/dL (ref 0.44–1.00)
GFR calc Af Amer: >60 mL/min (ref >60)
GFR calc non Af Amer: >60 mL/min (ref >60)
Glucose, Bld: 111 mg/dL — ABNORMAL HIGH (ref 70–99)
Potassium: 4 mmol/L (ref 3.5–5.1)
Sodium: 139 mmol/L (ref 135–145)
Total Bilirubin: 0.9 mg/dL (ref 0.3–1.2)
Total Protein: 6.6 g/dL (ref 6.5–8.1)

## 2020-02-07 LAB — ECHOCARDIOGRAM COMPLETE
Height: 67 in
Weight: 2400 oz

## 2020-02-07 LAB — CBC
HCT: 32.5 % — ABNORMAL LOW (ref 36.0–46.0)
Hemoglobin: 10.6 g/dL — ABNORMAL LOW (ref 12.0–15.0)
MCH: 28.4 pg (ref 26.0–34.0)
MCHC: 32.6 g/dL (ref 30.0–36.0)
MCV: 87.1 fL (ref 80.0–100.0)
Platelets: 229 10*3/uL (ref 150–400)
RBC: 3.73 MIL/uL — ABNORMAL LOW (ref 3.87–5.11)
RDW: 14 % (ref 11.5–15.5)
WBC: 4.7 10*3/uL (ref 4.0–10.5)
nRBC: 0 % (ref 0.0–0.2)

## 2020-02-07 LAB — PROCALCITONIN: Procalcitonin: <0.1 ng/mL

## 2020-02-07 LAB — PROTIME-INR
INR: 1.1 (ref 0.8–1.2)
Prothrombin Time: 14 seconds (ref 11.4–15.2)

## 2020-02-07 LAB — SEDIMENTATION RATE: Sed Rate: 60 mm/hr — ABNORMAL HIGH (ref 0–22)

## 2020-02-07 NOTE — Discharge Summary (Signed)
AMA  Patient at this time expressed her desire to leave the Hospital immidiately, patient was warned this am by me and now by the RN that this is not Medically advisable at this time, and can result in Medical complications like Death and Disability, patient understood and accepted the risks involved and assumed full responsibilty of this decision. She signed her AMA papers and left.   Susa Raring M.D on 02/07/2020 at 3:27 PM  [facility]  Time < 30 minutes  Last Note Below               PROGRESS NOTE                                                                                                                                                                                                             Patient Demographics:    Sara Bradshaw, is a 29 y.o. female, DOB - 1991-07-04, RPR:[id number]  Admit date - 02/05/2020   Admitting Physician Clydie Braun, MD  Outpatient Primary MD for the patient is Patient, No Pcp Per  LOS - 1  Chief Complaint  Patient presents with  . Back Pain       Brief Narrative - Sara Bradshaw is a 29 y.o. female with medical history significant of IVDU(methamphetamine) and bipolar 1 disorder presents with complaints of back pain for the last month.  She had been previously hospitalized at [facility] told she had an abscess of her back which they transferred her to [facility] in [place] where they had planned to perform surgery, fortunately she left AMA from there.  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   Subjective:    Sara Bradshaw today has, No headache, No chest pain, No  abdominal pain - No Nausea, No new weakness tingling or numbness, No Cough - SOB. +ve mid and low Back pain.   Assessment  & Plan :      1.  T7-T8 osteomyelitis with possible epidural abscess in a patient with history of IV drug use.  Unfortunately she recently signed out AMA from another hospital with surgery was planned, neurosurgery was consulted here and they have recommended medical management.  At this time blood cultures have been drawn and results are pending, I have ordered IR to do CT-guided aspiration of her spine abscess with the hopes of obtaining guided culture and sensitivity.  Obtain echocardiogram.  We will continue empiric IV antibiotics, monitor inflammatory markers.  Supportive care for now.  2.  Noncompliance with medications, signing out AMA, refusing to draw labs and undergo further testing.  Patient clearly has the capacity to make her own decisions, she was strictly warned that doing this could result in worsening of her infection, paralysis, disability and death.  She understands this and accepts full responsibility of her actions.  3.  IV drug use with on doing withdrawals.  On clonidine withdrawal protocol.    Condition - Extremely Guarded  Family Communication  :  Jerolyn Center husband [phone] 02/07/20 @ 10.08 am - message left.  Code Status :  Full  Consults  :  N. Surgery , IR  Procedures  :    MRI - Discitis osteomyelitis at T7-8 that has progressed from 01/08/2020, with small left ventral epidural abscess which mildly deforms the cord. Paravertebral infection on prior has improved, with resolved paravertebral abscess.  PUD Prophylaxis : PPI  Disposition Plan  :    Status is: Inpatient  Remains inpatient appropriate because:IV treatments appropriate due to intensity of illness or inability to take PO   Dispo: The patient is from: Home              Anticipated d/c is to: Home              Anticipated d/c date is: 3 days              Patient  currently is not medically stable to d/c.   DVT Prophylaxis  :  Lovenox   Lab Results  Component Value Date   PLT 229 02/07/2020    Diet :  Diet Order            Diet regular Room service appropriate? Yes; Fluid consistency: Thin  Diet effective now                  Inpatient Medications Scheduled Meds: . cloNIDine  0.1 mg Oral QID   Followed by  . [START ON 02/09/2020] cloNIDine  0.1 mg Oral BH-qamhs   Followed by  . [START ON 02/11/2020] cloNIDine  0.1 mg Oral QAC breakfast  . enoxaparin (LOVENOX) injection  40 mg Subcutaneous Q24H   Continuous Infusions: . ceFEPime (MAXIPIME) IV 2 g (02/07/20 1343)  . vancomycin Stopped (02/07/20 0832)   PRN Meds:.acetaminophen **OR** acetaminophen, albuterol, dicyclomine, hydrOXYzine, ketorolac, loperamide, methocarbamol, naproxen, ondansetron **OR** ondansetron (ZOFRAN) IV  Antibiotics  :   Anti-infectives (From admission, onward)   Start     Dose/Rate Route Frequency Ordered Stop   02/06/20 2300  vancomycin (VANCOCIN) IVPB 1000 mg/200 mL premix     Discontinue     1,000 mg 200 mL/hr over 60 Minutes Intravenous Every 8 hours 02/06/20 1437     02/06/20 1500  vancomycin (VANCOREADY) IVPB 1500 mg/300 mL        1,500 mg 150 mL/hr over 120 Minutes Intravenous  Once 02/06/20 1437 02/06/20 2021   02/06/20 1500  ceFEPIme (MAXIPIME) 2 g in sodium chloride 0.9 % 100 mL IVPB     Discontinue     2  g 200 mL/hr over 30 Minutes Intravenous Every 8 hours 02/06/20 1441            Objective:   Vitals:   02/06/20 1846 02/06/20 1857 02/06/20 1920 02/07/20 1409  BP:  119/70  114/82  Pulse:  83  75  Resp:  16 16 17   Temp: 98.2 F (36.8 C) 98.6 F (37 C)  98.3 F (36.8 C)  TempSrc: Oral Oral  Oral  SpO2:  100%  100%  Weight:      Height:        SpO2: 100 %  Wt Readings from Last 3 Encounters:  02/05/20 68 kg  04/02/19 64.4 kg  01/06/18 83.9 kg     Intake/Output Summary (Last 24 hours) at 02/07/2020 1527 Last data filed at  02/07/2020 1454 Gross per 24 hour  Intake 700.2 ml  Output --  Net 700.2 ml     Physical Exam  Awake Alert, No new F.N deficits, Normal affect [place].AT,PERRAL Supple Neck,No JVD, No cervical lymphadenopathy appriciated.  Symmetrical Chest wall movement, Good air movement bilaterally, CTAB RRR,No Gallops,Rubs or new Murmurs, No Parasternal Heave +ve B.Sounds, Abd Soft, No tenderness, No organomegaly appriciated, No rebound - guarding or rigidity. No Cyanosis, Clubbing or edema, +ve low and mid back pain.    Data Review:    Recent Labs  Lab 02/06/20 0403 02/07/20 1030  WBC 6.2 4.7  HGB 11.3* 10.6*  HCT 35.1* 32.5*  PLT 246 229  MCV 87.8 87.1  MCH 28.3 28.4  MCHC 32.2 32.6  RDW 14.0 14.0  LYMPHSABS 1.7  --   MONOABS 0.5  --   EOSABS 0.1  --   BASOSABS 0.0  --     Recent Labs  Lab 02/06/20 0403 02/07/20 1030  NA 138 139  K 3.9 4.0  CL 102 107  CO2 28 23  GLUCOSE 119* 111*  BUN <5* 14  CREATININE 0.63 0.66  CALCIUM 9.0 8.9  AST  --  12*  ALT  --  11  ALKPHOS  --  53  BILITOT  --  0.9  ALBUMIN  --  3.1*  CRP  --  2.8*  PROCALCITON  --  <0.10  INR  --  1.1    Recent Labs  Lab 02/06/20 1326 02/07/20 1030  CRP  --  2.8*  PROCALCITON  --  <0.10  SARSCOV2NAA NEGATIVE  --     ------------------------------------------------------------------------------------------------------------------ No results for input(s): CHOL, HDL, LDLCALC, TRIG, CHOLHDL, LDLDIRECT in the last 72 hours.  No results found for: HGBA1C ------------------------------------------------------------------------------------------------------------------ No results for input(s): TSH, T4TOTAL, T3FREE, THYROIDAB in the last 72 hours.  Invalid input(s): FREET3 ------------------------------------------------------------------------------------------------------------------ No results for input(s): VITAMINB12, FOLATE, FERRITIN, TIBC, IRON, RETICCTPCT in the last 72 hours.  Coagulation  profile Recent Labs  Lab 02/07/20 1030  INR 1.1    No results for input(s): DDIMER in the last 72 hours.  Cardiac Enzymes No results for input(s): CKMB, TROPONINI, MYOGLOBIN in the last 168 hours.  Invalid input(s): CK ------------------------------------------------------------------------------------------------------------------ No results found for: BNP  Micro Results Recent Results (from the past 240 hour(s))  SARS Coronavirus 2 by RT PCR (hospital order, [facility]) Nasopharyngeal Nasopharyngeal Swab     Status: None   Collection Time: 02/06/20  1:26 PM   Specimen: Nasopharyngeal Swab  Result Value Ref Range Status   SARS Coronavirus 2 NEGATIVE NEGATIVE Final    Comment: (NOTE) SARS-CoV-2 target nucleic acids are NOT DETECTED.  The SARS-CoV-2 RNA is  generally detectable in upper and lower respiratory specimens during the acute phase of infection. The lowest concentration of SARS-CoV-2 viral copies this assay can detect is 250 copies / mL. A negative result does not preclude SARS-CoV-2 infection and should not be used as the sole basis for treatment or other patient management decisions.  A negative result may occur with improper specimen collection / handling, submission of specimen other than nasopharyngeal swab, presence of viral mutation(s) within the areas targeted by this assay, and inadequate number of viral copies (<250 copies / mL). A negative result must be combined with clinical observations, patient history, and epidemiological information.  Fact Sheet for Patients:   BoilerBrush.com.cy  Fact Sheet for Healthcare Providers: https://pope.com/  This test is not yet approved or  cleared by the [place] FDA and has been authorized for detection and/or diagnosis of SARS-CoV-2 by FDA under an Emergency Use Authorization (EUA).  This EUA will remain in effect (meaning this test can  be used) for the duration of the COVID-19 declaration under Section 564(b)(1) of the Act, 21 U.S.C. section 360bbb-3(b)(1), unless the authorization is terminated or revoked sooner.  Performed at [facility], [address], [place], [place] [address]   Culture, blood (routine x 2)     Status: None (Preliminary result)   Collection Time: 02/06/20  3:40 PM   Specimen: BLOOD  Result Value Ref Range Status   Specimen Description BLOOD RIGHT ANTECUBITAL  Final   Special Requests   Final    BOTTLES DRAWN AEROBIC AND ANAEROBIC Blood Culture results may not be optimal due to an inadequate volume of blood received in culture bottles   Culture   Final    NO GROWTH < 24 HOURS Performed at [facility], [address], [place], [place] [address]    Report Status PENDING  Incomplete    Radiology Reports MR THORACIC SPINE W WO CONTRAST  Result Date: 02/06/2020 CLINICAL DATA:  Heavy drug abuse with mid back pain EXAM: MRI THORACIC WITHOUT AND WITH CONTRAST TECHNIQUE: Multiplanar and multiecho pulse sequences of the thoracic spine were obtained without and with intravenous contrast. CONTRAST:  7.71mL GADAVIST GADOBUTROL 1 MMOL/ML IV SOLN COMPARISON:  01/08/2020 FINDINGS: MRI THORACIC SPINE FINDINGS Alignment:  Normal Vertebrae: Progressive marrow edema and new endplate erosion at T7-8, with disc T2 hyperintensity. There is a new left ventral, paracentral epidural abscess measuring 1 cm in length and 2 mm in thickness. There is contact of the cord with mild cord deformity. Cord:  No cord edema or infarct Paraspinal and other soft tissues: Paravertebral phlegmon at the level of T7-8 infection has improved, with resolved paraspinal abscess. Disc levels: No degenerative changes. 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 Paravertebral infection on prior has improved, with resolved  paravertebral abscess. Electronically Signed   By: Marnee Spring M.D.   On: 02/06/2020 07:56   MR Lumbar Spine W Wo Contrast  Result Date: 02/06/2020 CLINICAL DATA:  Back pain EXAM: MRI LUMBAR SPINE WITHOUT AND WITH CONTRAST TECHNIQUE: Multiplanar and multiecho pulse sequences of the lumbar spine were obtained without and with intravenous contrast. CONTRAST:  7.36mL GADAVIST GADOBUTROL 1 MMOL/ML IV SOLN COMPARISON:  None. FINDINGS: Segmentation:  Normal Alignment:  Normal Vertebrae:  No fracture, evidence of discitis, or bone lesion. Conus medullaris and cauda equina: Conus extends to the L1 level. Conus and cauda equina appear normal. Paraspinal and other soft  tissues: Negative Disc levels: Mild narrowing and desiccation at L5-S1 with mild disc bulging. IMPRESSION: Negative for lumbar spine infection. Electronically Signed   By: Marnee Spring M.D.   On: 02/06/2020 08:20   ECHOCARDIOGRAM COMPLETE  Result Date: 02/07/2020    ECHOCARDIOGRAM REPORT   Patient Name:   Sara Bradshaw Date of Exam: 02/07/2020 Medical Rec #:  [id number]           Height:       67.0 in Accession #:    [id number]          Weight:       150.0 lb Date of Birth:  17-Jul-1991           BSA:          1.790 m Patient Age:    28 years            BP:           119/70 mmHg Patient Gender: F                   HR:           76 bpm. Exam Location:  Inpatient Procedure: 2D Echo, Cardiac Doppler and Color Doppler Indications:    I50.9* Heart failure (unspecified)  History:        Patient has no prior history of Echocardiogram examinations. IV                 drug use.  Sonographer:    Sheralyn Boatman RDCS Referring Phys: [id number] [facility]  Sonographer Comments: Image acquisition challenging due to uncooperative patient. Patient ripped her gown down and told me that I could only do the test if she could sleep. Patient was supine and then rolled to the right decubitus position at the apical 2 chamber view. IMPRESSIONS  1. Left ventricular ejection  fraction, by estimation, is 60 to 65%. The left ventricle has normal function. The left ventricle has no regional wall motion abnormalities. Left ventricular diastolic parameters were normal.  2. Right ventricular systolic function is normal. The right ventricular size is normal.  3. The mitral valve is normal in structure. Trivial mitral valve regurgitation. No evidence of mitral stenosis.  4. The aortic valve is tricuspid. Aortic valve regurgitation is not visualized. No aortic stenosis is present.  5. The inferior vena cava is normal in size with greater than 50% respiratory variability, suggesting right atrial pressure of 3 mmHg. FINDINGS  Left Ventricle: Left ventricular ejection fraction, by estimation, is 60 to 65%. The left ventricle has normal function. The left ventricle has no regional wall motion abnormalities. The left ventricular internal cavity size was normal in size. There is  no left ventricular hypertrophy. Left ventricular diastolic parameters were normal. Right Ventricle: The right ventricular size is normal.Right ventricular systolic function is normal. Left Atrium: Left atrial size was normal in size. Right Atrium: Right atrial size was normal in size. Pericardium: There is no evidence of pericardial effusion. Mitral Valve: The mitral valve is normal in structure. Normal mobility of the mitral valve leaflets. Trivial mitral valve regurgitation. No evidence of mitral valve stenosis. Tricuspid Valve: The tricuspid valve is normal in structure. Tricuspid valve regurgitation is trivial. No evidence of tricuspid stenosis. Aortic Valve: The aortic valve is tricuspid. Aortic valve regurgitation is not visualized. No aortic stenosis is present. Pulmonic Valve: The pulmonic valve was normal in structure. Pulmonic valve regurgitation is not visualized. No evidence of pulmonic stenosis. Aorta: The  aortic root is normal in size and structure. Venous: The inferior vena cava is normal in size with greater  than 50% respiratory variability, suggesting right atrial pressure of 3 mmHg. IAS/Shunts: No atrial level shunt detected by color flow Doppler.  LEFT VENTRICLE PLAX 2D LVIDd:         4.70 cm     Diastology LVIDs:         3.10 cm     LV e' lateral:   13.50 cm/s LV PW:         0.90 cm     LV E/e' lateral: 3.7 LV IVS:        1.20 cm     LV e' medial:    9.14 cm/s LVOT diam:     2.10 cm     LV E/e' medial:  5.5 LV SV:         62 LV SV Index:   35 LVOT Area:     3.46 cm  LV Volumes (MOD) LV vol d, MOD A2C: 59.8 ml LV vol d, MOD A4C: 99.9 ml LV vol s, MOD A2C: 29.0 ml LV vol s, MOD A4C: 36.7 ml LV SV MOD A2C:     30.8 ml LV SV MOD A4C:     99.9 ml LV SV MOD BP:      44.6 ml RIGHT VENTRICLE             IVC RV S prime:     11.40 cm/s  IVC diam: 1.80 cm TAPSE (M-mode): 2.4 cm LEFT ATRIUM             Index       RIGHT ATRIUM          Index LA diam:        2.90 cm 1.62 cm/m  RA Area:     8.03 cm LA Vol (A2C):   19.2 ml 10.73 ml/m RA Volume:   13.30 ml 7.43 ml/m LA Vol (A4C):   26.7 ml 14.92 ml/m LA Biplane Vol: 24.2 ml 13.52 ml/m  AORTIC VALVE LVOT Vmax:   87.50 cm/s LVOT Vmean:  67.300 cm/s LVOT VTI:    0.179 m  AORTA Ao Root diam: 3.00 cm MITRAL VALVE MV Area (PHT): 2.95 cm    SHUNTS MV Decel Time: 257 msec    Systemic VTI:  0.18 m MV E velocity: 50.49 cm/s  Systemic Diam: 2.10 cm MV A velocity: 64.00 cm/s MV E/A ratio:  0.79 Olga Millers MD Electronically signed by Olga Millers MD Signature Date/Time: 02/07/2020/3:20:16 PM    Final     Time Spent in minutes  30   Susa Raring M.D on 02/07/2020 at 3:27 PM  To page go to [email or web address] - password [facility]

## 2020-02-07 NOTE — Social Work (Signed)
CSW consulted "no insurance." TOC team does not enroll pts in insurance. Secure messaged Kenda, Artist with [employer] to assess for Medicaid eligibility.   Octavio Graves, MSW, LCSW [facility] Clinical Social Work

## 2020-02-07 NOTE — Progress Notes (Addendum)
Patient given explanation of AMA paperwork and effect on care.  Patient verbalized understanding and signed AMA paperwork at 1457.  Patient walked to ED for transportation pick up home. Dr Susa Raring notified vie telephone that the patient left AMA.  Renda Rolls RN 02/07/20 @1514 

## 2020-02-07 NOTE — Progress Notes (Signed)
OT Cancellation Note  Patient Details Name: Sara Bradshaw MRN: [id number] DOB: 1991/03/24   Cancelled Treatment:    Reason Eval/Treat Not Completed: Patient declined, no reason specified  Mateo Flow 02/07/2020, 1:20 PM

## 2020-02-07 NOTE — Progress Notes (Signed)
PROGRESS NOTE                                                                                                                                                                                                             Patient Demographics:    Sara Bradshaw, is a 29 y.o. female, DOB - 05/11/1991, IOE:[id number]  Admit date - 02/05/2020   Admitting Physician Clydie Braun, MD  Outpatient Primary MD for the patient is Patient, No Pcp Per  LOS - 1  Chief Complaint  Patient presents with  . Back Pain       Brief Narrative - Sara Bradshaw is a 29 y.o. female with medical history significant of IVDU(methamphetamine) and bipolar 1 disorder presents with complaints of back pain for the last month.  She had been previously hospitalized at [facility] told she had an abscess of her back which they transferred her to [facility] in [place] where they had planned to perform surgery, fortunately she left AMA from there.  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   Subjective:    Sara Bradshaw today has, No headache, No chest pain, No abdominal pain - No Nausea, No new weakness tingling or numbness, No Cough - SOB. +ve mid and low Back pain.   Assessment  & Plan :      1.  T7-T8 osteomyelitis with possible epidural abscess in a patient with history of IV drug use.  Unfortunately she recently signed out AMA from another hospital with surgery was planned, neurosurgery was consulted here and they have recommended medical management.  At this time blood cultures have been drawn and results are pending, I have ordered IR to do CT-guided aspiration of her spine abscess with the hopes of obtaining guided culture and sensitivity.  Obtain echocardiogram.  We will continue empiric  IV antibiotics, monitor inflammatory markers.  Supportive care for now.  2.  Noncompliance with medications, signing out AMA, refusing to draw labs and undergo further testing.  Patient clearly has the capacity to make her own decisions, she was strictly warned that doing this could result in worsening of her infection, paralysis, disability and death.  She understands this and accepts full responsibility of her actions.  3.  IV drug use with on doing withdrawals.  On clonidine withdrawal protocol.  Condition - Extremely Guarded  Family Communication  :  Jerolyn Center husband [phone] 02/07/20 @ 10.08 am - message left.  Code Status :  Full  Consults  :  N. Surgery , IR  Procedures  :    MRI - Discitis osteomyelitis at T7-8 that has progressed from 01/08/2020, with small left ventral epidural abscess which mildly deforms the cord. Paravertebral infection on prior has improved, with resolved paravertebral abscess.  PUD Prophylaxis : PPI  Disposition Plan  :    Status is: Inpatient  Remains inpatient appropriate because:IV treatments appropriate due to intensity of illness or inability to take PO   Dispo: The patient is from: Home              Anticipated d/c is to: Home              Anticipated d/c date is: 3 days              Patient currently is not medically stable to d/c.   DVT Prophylaxis  :  Lovenox   Lab Results  Component Value Date   PLT 246 02/06/2020    Diet :  Diet Order            Diet regular Room service appropriate? Yes; Fluid consistency: Thin  Diet effective now                  Inpatient Medications Scheduled Meds: . cloNIDine  0.1 mg Oral QID   Followed by  . [START ON 02/09/2020] cloNIDine  0.1 mg Oral BH-qamhs   Followed by  . [START ON 02/11/2020] cloNIDine  0.1 mg Oral QAC breakfast  . enoxaparin (LOVENOX) injection  40 mg Subcutaneous Q24H   Continuous Infusions: . ceFEPime (MAXIPIME) IV 2 g (02/07/20 0534)  . vancomycin 1,000 mg  (02/07/20 0732)   PRN Meds:.acetaminophen **OR** acetaminophen, albuterol, dicyclomine, hydrOXYzine, ketorolac, loperamide, methocarbamol, naproxen, ondansetron **OR** ondansetron (ZOFRAN) IV  Antibiotics  :   Anti-infectives (From admission, onward)   Start     Dose/Rate Route Frequency Ordered Stop   02/06/20 2300  vancomycin (VANCOCIN) IVPB 1000 mg/200 mL premix     Discontinue     1,000 mg 200 mL/hr over 60 Minutes Intravenous Every 8 hours 02/06/20 1437     02/06/20 1500  vancomycin (VANCOREADY) IVPB 1500 mg/300 mL        1,500 mg 150 mL/hr over 120 Minutes Intravenous  Once 02/06/20 1437 02/06/20 2021   02/06/20 1500  ceFEPIme (MAXIPIME) 2 g in sodium chloride 0.9 % 100 mL IVPB     Discontinue     2 g 200 mL/hr over 30 Minutes Intravenous Every 8 hours 02/06/20 1441            Objective:   Vitals:   02/06/20 1815 02/06/20 1846 02/06/20 1857 02/06/20 1920  BP: 109/90  119/70   Pulse: 62  83   Resp: 18  16 16   Temp:  98.2 F (36.8 C) 98.6 F (37 C)   TempSrc:  Oral Oral   SpO2: 95%  100%   Weight:      Height:        SpO2: 100 %  Wt Readings from Last 3 Encounters:  02/05/20 68 kg  04/02/19 64.4 kg  01/06/18 83.9 kg     Intake/Output Summary (Last 24 hours) at 02/07/2020 1003 Last data filed at 02/07/2020 0400 Gross per 24 hour  Intake 400.2 ml  Output --  Net 400.2 ml  Physical Exam  Awake Alert, No new F.N deficits, Normal affect [place].AT,PERRAL Supple Neck,No JVD, No cervical lymphadenopathy appriciated.  Symmetrical Chest wall movement, Good air movement bilaterally, CTAB RRR,No Gallops,Rubs or new Murmurs, No Parasternal Heave +ve B.Sounds, Abd Soft, No tenderness, No organomegaly appriciated, No rebound - guarding or rigidity. No Cyanosis, Clubbing or edema, +ve low and mid back pain.    Data Review:    Recent Labs  Lab 02/06/20 0403  WBC 6.2  HGB 11.3*  HCT 35.1*  PLT 246  MCV 87.8  MCH 28.3  MCHC 32.2  RDW 14.0  LYMPHSABS 1.7   MONOABS 0.5  EOSABS 0.1  BASOSABS 0.0    Recent Labs  Lab 02/06/20 0403  NA 138  K 3.9  CL 102  CO2 28  GLUCOSE 119*  BUN <5*  CREATININE 0.63  CALCIUM 9.0    Recent Labs  Lab 02/06/20 1326  SARSCOV2NAA NEGATIVE    ------------------------------------------------------------------------------------------------------------------ No results for input(s): CHOL, HDL, LDLCALC, TRIG, CHOLHDL, LDLDIRECT in the last 72 hours.  No results found for: HGBA1C ------------------------------------------------------------------------------------------------------------------ No results for input(s): TSH, T4TOTAL, T3FREE, THYROIDAB in the last 72 hours.  Invalid input(s): FREET3 ------------------------------------------------------------------------------------------------------------------ No results for input(s): VITAMINB12, FOLATE, FERRITIN, TIBC, IRON, RETICCTPCT in the last 72 hours.  Coagulation profile No results for input(s): INR, PROTIME in the last 168 hours.  No results for input(s): DDIMER in the last 72 hours.  Cardiac Enzymes No results for input(s): CKMB, TROPONINI, MYOGLOBIN in the last 168 hours.  Invalid input(s): CK ------------------------------------------------------------------------------------------------------------------ No results found for: BNP  Micro Results Recent Results (from the past 240 hour(s))  SARS Coronavirus 2 by RT PCR (hospital order, performed [facility]) Nasopharyngeal Nasopharyngeal Swab     Status: None   Collection Time: 02/06/20  1:26 PM   Specimen: Nasopharyngeal Swab  Result Value Ref Range Status   SARS Coronavirus 2 NEGATIVE NEGATIVE Final    Comment: (NOTE) SARS-CoV-2 target nucleic acids are NOT DETECTED.  The SARS-CoV-2 RNA is generally detectable in upper and lower respiratory specimens during the acute phase of infection. The lowest concentration of SARS-CoV-2 viral copies this assay can detect is  250 copies / mL. A negative result does not preclude SARS-CoV-2 infection and should not be used as the sole basis for treatment or other patient management decisions.  A negative result may occur with improper specimen collection / handling, submission of specimen other than nasopharyngeal swab, presence of viral mutation(s) within the areas targeted by this assay, and inadequate number of viral copies (<250 copies / mL). A negative result must be combined with clinical observations, patient history, and epidemiological information.  Fact Sheet for Patients:   BoilerBrush.com.cyhttps://www.fda.gov/media/136312/download  Fact Sheet for Healthcare Providers: https://pope.com/https://www.fda.gov/media/136313/download  This test is not yet approved or  cleared by the [place] FDA and has been authorized for detection and/or diagnosis of SARS-CoV-2 by FDA under an Emergency Use Authorization (EUA).  This EUA will remain in effect (meaning this test can be used) for the duration of the COVID-19 declaration under Section 564(b)(1) of the Act, 21 U.S.C. section 360bbb-3(b)(1), unless the authorization is terminated or revoked sooner.  Performed at [facility], [address] [place], [place] [address]     Radiology Reports MR THORACIC SPINE W WO CONTRAST  Result Date: 02/06/2020 CLINICAL DATA:  Heavy drug abuse with mid back pain EXAM: MRI THORACIC WITHOUT AND WITH CONTRAST TECHNIQUE: Multiplanar and multiecho pulse sequences of the thoracic spine were obtained  without and with intravenous contrast. CONTRAST:  7.34mL GADAVIST GADOBUTROL 1 MMOL/ML IV SOLN COMPARISON:  01/08/2020 FINDINGS: MRI THORACIC SPINE FINDINGS Alignment:  Normal Vertebrae: Progressive marrow edema and new endplate erosion at T7-8, with disc T2 hyperintensity. There is a new left ventral, paracentral epidural abscess measuring 1 cm in length and 2 mm in thickness. There is contact of the cord with mild cord deformity. Cord:  No cord edema or  infarct Paraspinal and other soft tissues: Paravertebral phlegmon at the level of T7-8 infection has improved, with resolved paraspinal abscess. Disc levels: No degenerative changes. 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 Paravertebral infection on prior has improved, with resolved paravertebral abscess. Electronically Signed   By: Marnee Spring M.D.   On: 02/06/2020 07:56   MR Lumbar Spine W Wo Contrast  Result Date: 02/06/2020 CLINICAL DATA:  Back pain EXAM: MRI LUMBAR SPINE WITHOUT AND WITH CONTRAST TECHNIQUE: Multiplanar and multiecho pulse sequences of the lumbar spine were obtained without and with intravenous contrast. CONTRAST:  7.16mL GADAVIST GADOBUTROL 1 MMOL/ML IV SOLN COMPARISON:  None. FINDINGS: Segmentation:  Normal Alignment:  Normal Vertebrae:  No fracture, evidence of discitis, or bone lesion. Conus medullaris and cauda equina: Conus extends to the L1 level. Conus and cauda equina appear normal. Paraspinal and other soft tissues: Negative Disc levels: Mild narrowing and desiccation at L5-S1 with mild disc bulging. IMPRESSION: Negative for lumbar spine infection. Electronically Signed   By: Marnee Spring M.D.   On: 02/06/2020 08:20    Time Spent in minutes  30   Susa Raring M.D on 02/07/2020 at 10:03 AM  To page go to [email or web address] - password [email or web address]

## 2020-02-07 NOTE — Plan of Care (Signed)
  Problem: Education: Goal: Knowledge of General Education information will improve Description: Including pain rating scale, medication(s)/side effects and non-pharmacologic comfort measures Outcome: Not Progressing   

## 2020-02-07 NOTE — Progress Notes (Signed)
PT Cancellation Note  Patient Details Name: Sara Bradshaw MRN: [id number] DOB: 04-16-1991   Cancelled Treatment:    Reason Eval/Treat Not Completed: Patient declined, no reason specified (pt refused evaluation stating she wants to sleep longer and that she will be willing if she can sleep at least an hour)   [name] B [name] 02/07/2020, 10:58 AM  [name], PT Acute Rehabilitation Services Pager: [phone] Office: [phone]

## 2020-02-07 NOTE — Progress Notes (Signed)
Lab to pt's room to reattempt blood draw for am labs, again patient has refused. Pt refuses to allow vital signs to be obtained, still refusing telemetry monitor and all care other than IV medications. Dr. Bruna Potter was paged earlier in shift to inform; no change in patient attitude or status. 02/07/2020 Manson Allan, RN

## 2020-02-07 NOTE — Progress Notes (Signed)
  Echocardiogram 2D Echocardiogram has been performed.  Janalyn Harder 02/07/2020, 12:27 PM

## 2020-02-07 NOTE — Progress Notes (Signed)
IR requested by Dr. Thedore Mins for possible image-guided aspiration of epidural abscess.  Case/images have been reviewed by Dr. Tommie Sams who states epidural abscess is very small, anterior, most likely phlegmon, and not amendable to percutaneous drainage. No plans for IR interventions at this time- will delete order. Dr. Thedore Mins made aware.  IR available in future if needed.   Waylan Boga Rever Pichette, PA-C 02/07/2020, 10:28 AM

## 2020-02-07 NOTE — Progress Notes (Signed)
PT Cancellation Note  Patient Details Name: Sara Bradshaw MRN: [id number] DOB: [birth year]   Cancelled Treatment:    Reason Eval/Treat Not Completed: Patient declined, no reason specified (returned at pt requested time and pt denied participation stating she feels tired and sick. Pt aware of roll of therapy and that this therapist returned when she specifically asked and yet she refused. Made aware of refusal policy with pt stating awareness. Pt stating she will participate next date.)   Denman Pichardo B Analy Bassford 02/07/2020, 12:25 PM [name], PT Acute Rehabilitation Services Pager: [phone] Office: [phone]

## 2020-02-07 NOTE — Progress Notes (Signed)
Paged on call hospitalist Dr. Bruna Potter to inform that pt refuses lab draws, telemetry monitor placement, and has threatened to leave AMA several times since her arrival to unit. She wants to leave "because I'm withdrawing so bad, I need to get out of here". She is on CIWA scale, her highest score has been 15. She is currently asleep but whenever she wakes she immediately begins screaming that she is in withdrawal. She has been medicated with appropriate PRN medications for pain and withdrawal. She may still leave AMA in the morning, however I have tried to keep her appeased and as comfortable as possible in the hopes that she will stay and receive the care she needs. She has refused all care that [facility] not pertain to drugs in some way. The only reason she did not pull out her IV was because she knows that is how her medication comes. She also now refuses the PO medications (robaxin) that was given "because it doesn't work".   Currently no return response from MD necessary and none received at this time.  02/07/2020 @ 0204 Manson Allan, RN

## 2020-02-08 ENCOUNTER — Telehealth: Payer: Self-pay | Admitting: General Practice

## 2020-02-08 NOTE — Telephone Encounter (Signed)
Pt's grandmother Sara Bradshaw, calling to ask general questions about treatment for infection and the length of time required for treatment. Pt's grandmother asking "If someone were dying from an infection, that they were told had spread to the bone, how long could they go without antibiotic?". Explained to pt's grandmother that the length of time for treatment for infection would be based on the severity and type of infection. Pt's grandmother states that the patient is manic and never stays at the hospital as long as she needs to stay.Patient's grandmother states that the patient left the hospital recently.Patient's grandmother states that she is one of the only people that can talk to her and she is trying to save her life. Patient's grandmother states that the patient does not have a PCP. Advised patient's grandmother that I was not able to discuss the patient's personal information but if there were concerns regarding completing antibiotic treatment the patient should go back to the hospital. Pt's grandmother verbalized understanding.

## 2020-02-11 LAB — CULTURE, BLOOD (ROUTINE X 2): Culture: NO GROWTH

## 2020-05-13 DIAGNOSIS — B9562 Methicillin resistant Staphylococcus aureus infection as the cause of diseases classified elsewhere: Secondary | ICD-10-CM | POA: Insufficient documentation

## 2020-05-13 DIAGNOSIS — R7881 Bacteremia: Secondary | ICD-10-CM | POA: Insufficient documentation

## 2020-05-22 ENCOUNTER — Inpatient Hospital Stay (HOSPITAL_COMMUNITY): Payer: Self-pay

## 2020-05-22 ENCOUNTER — Inpatient Hospital Stay (HOSPITAL_COMMUNITY)
Admission: EM | Admit: 2020-05-22 | Discharge: 2020-05-25 | DRG: 540 | Discharge status: Against Medical Advice | Payer: Self-pay | Attending: Family Medicine | Admitting: Family Medicine

## 2020-05-22 ENCOUNTER — Emergency Department (HOSPITAL_COMMUNITY): Payer: Self-pay

## 2020-05-22 ENCOUNTER — Encounter (HOSPITAL_COMMUNITY): Payer: Self-pay | Admitting: Family Medicine

## 2020-05-22 ENCOUNTER — Other Ambulatory Visit: Payer: Self-pay

## 2020-05-22 DIAGNOSIS — Z86711 Personal history of pulmonary embolism: Secondary | ICD-10-CM

## 2020-05-22 DIAGNOSIS — M4624 Osteomyelitis of vertebra, thoracic region: Principal | ICD-10-CM | POA: Diagnosis present

## 2020-05-22 DIAGNOSIS — Z8249 Family history of ischemic heart disease and other diseases of the circulatory system: Secondary | ICD-10-CM

## 2020-05-22 DIAGNOSIS — F1510 Other stimulant abuse, uncomplicated: Secondary | ICD-10-CM | POA: Diagnosis present

## 2020-05-22 DIAGNOSIS — Z880 Allergy status to penicillin: Secondary | ICD-10-CM

## 2020-05-22 DIAGNOSIS — I82621 Acute embolism and thrombosis of deep veins of right upper extremity: Secondary | ICD-10-CM | POA: Diagnosis present

## 2020-05-22 DIAGNOSIS — M869 Osteomyelitis, unspecified: Secondary | ICD-10-CM

## 2020-05-22 DIAGNOSIS — Z20822 Contact with and (suspected) exposure to covid-19: Secondary | ICD-10-CM | POA: Diagnosis present

## 2020-05-22 DIAGNOSIS — F17210 Nicotine dependence, cigarettes, uncomplicated: Secondary | ICD-10-CM | POA: Diagnosis present

## 2020-05-22 DIAGNOSIS — F1990 Other psychoactive substance use, unspecified, uncomplicated: Secondary | ICD-10-CM

## 2020-05-22 DIAGNOSIS — Z86718 Personal history of other venous thrombosis and embolism: Secondary | ICD-10-CM

## 2020-05-22 DIAGNOSIS — L299 Pruritus, unspecified: Secondary | ICD-10-CM | POA: Diagnosis not present

## 2020-05-22 DIAGNOSIS — Z833 Family history of diabetes mellitus: Secondary | ICD-10-CM

## 2020-05-22 DIAGNOSIS — Z881 Allergy status to other antibiotic agents status: Secondary | ICD-10-CM

## 2020-05-22 DIAGNOSIS — M4644 Discitis, unspecified, thoracic region: Principal | ICD-10-CM | POA: Diagnosis present

## 2020-05-22 DIAGNOSIS — Z885 Allergy status to narcotic agent status: Secondary | ICD-10-CM

## 2020-05-22 DIAGNOSIS — B9562 Methicillin resistant Staphylococcus aureus infection as the cause of diseases classified elsewhere: Secondary | ICD-10-CM | POA: Diagnosis present

## 2020-05-22 DIAGNOSIS — Z5329 Procedure and treatment not carried out because of patient's decision for other reasons: Secondary | ICD-10-CM | POA: Diagnosis present

## 2020-05-22 DIAGNOSIS — B1920 Unspecified viral hepatitis C without hepatic coma: Secondary | ICD-10-CM | POA: Diagnosis present

## 2020-05-22 DIAGNOSIS — Z9119 Patient's noncompliance with other medical treatment and regimen: Secondary | ICD-10-CM

## 2020-05-22 DIAGNOSIS — R1011 Right upper quadrant pain: Secondary | ICD-10-CM

## 2020-05-22 LAB — COMPREHENSIVE METABOLIC PANEL
ALT: 19 U/L (ref 0–44)
AST: 19 U/L (ref 15–41)
Albumin: 3.7 g/dL (ref 3.5–5.0)
Alkaline Phosphatase: 64 U/L (ref 38–126)
Anion gap: 17 — ABNORMAL HIGH (ref 5–15)
BUN: 11 mg/dL (ref 6–20)
CO2: 23 mmol/L (ref 22–32)
Calcium: 9.5 mg/dL (ref 8.9–10.3)
Chloride: 99 mmol/L (ref 98–111)
Creatinine, Ser: 0.78 mg/dL (ref 0.44–1.00)
GFR, Estimated: >60 mL/min (ref >60)
Glucose, Bld: 117 mg/dL — ABNORMAL HIGH (ref 70–99)
Potassium: 4.2 mmol/L (ref 3.5–5.1)
Sodium: 139 mmol/L (ref 135–145)
Total Bilirubin: 0.6 mg/dL (ref 0.3–1.2)
Total Protein: 8 g/dL (ref 6.5–8.1)

## 2020-05-22 LAB — RESPIRATORY PANEL BY RT PCR (FLU A&B, COVID)
Influenza A by PCR: NEGATIVE
Influenza B by PCR: NEGATIVE
SARS Coronavirus 2 by RT PCR: NEGATIVE

## 2020-05-22 LAB — CBC
HCT: 36.5 % (ref 36.0–46.0)
Hemoglobin: 11.1 g/dL — ABNORMAL LOW (ref 12.0–15.0)
MCH: 27.3 pg (ref 26.0–34.0)
MCHC: 30.4 g/dL (ref 30.0–36.0)
MCV: 89.7 fL (ref 80.0–100.0)
Platelets: 385 10*3/uL (ref 150–400)
RBC: 4.07 MIL/uL (ref 3.87–5.11)
RDW: 16.5 % — ABNORMAL HIGH (ref 11.5–15.5)
WBC: 7.1 10*3/uL (ref 4.0–10.5)
nRBC: 0 % (ref 0.0–0.2)

## 2020-05-22 LAB — RAPID URINE DRUG SCREEN, HOSP PERFORMED
Amphetamines: POSITIVE — AB
Barbiturates: NOT DETECTED
Benzodiazepines: NOT DETECTED
Cocaine: NOT DETECTED
Opiates: POSITIVE — AB
Tetrahydrocannabinol: NOT DETECTED

## 2020-05-22 LAB — LACTIC ACID, PLASMA: Lactic Acid, Venous: 1.6 mmol/L (ref 0.5–1.9)

## 2020-05-22 LAB — CK: Total CK: 39 U/L (ref 38–234)

## 2020-05-22 MED ORDER — ACETAMINOPHEN 650 MG RE SUPP: 650.0000 mg | Freq: Four times a day (QID) | RECTAL | Status: DC | PRN

## 2020-05-22 MED ORDER — HEPARIN BOLUS VIA INFUSION
4400.0000 [IU] | Freq: Once | INTRAVENOUS | Status: AC
  Administered 2020-05-22: 4400 [IU] via INTRAVENOUS
  Filled 2020-05-22: qty 4400

## 2020-05-22 MED ORDER — ACETAMINOPHEN 325 MG PO TABS: 650.0000 mg | ORAL_TABLET | Freq: Four times a day (QID) | ORAL | Status: DC | PRN

## 2020-05-22 MED ORDER — SODIUM CHLORIDE 0.9 % IV SOLN
590.0000 mg | Freq: Every day | INTRAVENOUS | Status: DC
  Administered 2020-05-22 – 2020-05-24 (×3): 590 mg via INTRAVENOUS
  Filled 2020-05-22 (×4): qty 11.8

## 2020-05-22 MED ORDER — NICOTINE 21 MG/24HR TD PT24
21.0000 mg | MEDICATED_PATCH | Freq: Every day | TRANSDERMAL | Status: DC
  Administered 2020-05-22 – 2020-05-25 (×4): 21 mg via TRANSDERMAL
  Filled 2020-05-22 (×4): qty 1

## 2020-05-22 MED ORDER — HEPARIN (PORCINE) 25000 UT/250ML-% IV SOLN
1450.0000 [IU]/h | INTRAVENOUS | Status: DC
  Administered 2020-05-22: 1300 [IU]/h via INTRAVENOUS
  Filled 2020-05-22 (×2): qty 250

## 2020-05-22 MED ORDER — SENNA 8.6 MG PO TABS
1.0000 | ORAL_TABLET | Freq: Every day | ORAL | Status: DC
  Administered 2020-05-23 – 2020-05-25 (×2): 8.6 mg via ORAL
  Filled 2020-05-22 (×4): qty 1

## 2020-05-22 MED ORDER — ACETAMINOPHEN 500 MG PO TABS
1000.0000 mg | ORAL_TABLET | Freq: Four times a day (QID) | ORAL | Status: DC | PRN
  Administered 2020-05-23: 1000 mg via ORAL
  Filled 2020-05-22 (×2): qty 2

## 2020-05-22 NOTE — H&P (Addendum)
Family Medicine Teaching [facility] Admission History and Physical Service Pager: [phone]  Patient name: Sara Bradshaw Medical record number: [id number] Date of birth: August 24, 1990 Age: 29 y.o. Gender: female  Primary Care Provider: Patient, No Pcp Per Consultants: Neurosurgery and infectious disease Code Status: Full Preferred Emergency Contact:   Chief Complaint: Back pain due to osteomyelitis and discitis  Assessment and Plan: Sara Bradshaw is a 29 y.o. female presenting with back pain. She was admitted to [facility] on 10/16 and was diagnosed with osteomyelitis, discitis, RUE DVT, LUE superficial thrombus,  and MRSA bacteremia. Left AMA on 10/20 and subsequently returned to [facility] on 10/25. PMH is significant for IVDU. She was being treated with IV Daptomycin and heparin but left AMA on 10/20.  Osteomyelitis  Discitis  Pt complains of chronic back pain going back several months.  Pt has osteomyelitis, discitis of the thoracic region and a small paraspinal abcess of the thoracic region. First noted on MRI in July during a hospitalization in which the patient left AMA. Most recently she was seen at [facility] where osteomyelitis/discitis was noted and she was treated with daptomycin before leaving AMA. Blood cultures grew MRSA on 10/16 at [facility], no growth on 10/18. Currently no fever or chills. On exam she has TTP in the midline thoracolumbar region.  Neurosurgery and ID consulted in the ED.   - Admit to inpatient, med surg, Dr. Leveda Anna attending.  - neurosurgery consulted, appreciate recs - ID consulted, appreciate recs -IV Daptomycin per pharm consult -blood cultures ordered -acetaminophen Q6H PRN - K pad for pain - vitals per routine  RUE DVT, LUE superficial thrombus U/S today shows RUE DVT. Pt previously found to have occlusive right subclavian thrombus and LUE superficial occlusive thrombus at [facility]. was receiving anticoagulation while admitted, but left AMA so  has not been on [facility] since that time. There was mention from ED provider of b/l pulmonary embolisms but unable to find this in care everywhere notes so far.   - Heparin infusion per pharm consult  Substance Abuse Disorder Pt is a IV drug user (meth), but states she has not used drugs in a year, since her fiance died.  Her UDS was positive for opiates and amphetamines.  On exam she appears restless, and is itching her abdomen and picking at her face.  Very likely that patient is still abusing methamphetamines.   - COWS scoring - acute hepatitis panel - monitor for signs of withdrawal or ongoing drug abuse.    Tobacco Use Disorder  Has smoked 1 ppd for the past 16 years -nicotine patch  Right upper quadrant pain Right upper quadrant and b/l lower rib pain for the past week with movement and palpation -RUQ U/S ordered - Consider CT abdomen if pt worsens.  FEN/GI: regular diet Prophylaxis: Therapeutic dose of Heparin  Disposition: Med-Surg   History of Present Illness: Sara Bradshaw is a 29 y.o. female presenting with back pain for several months which became severe around 2 weeks ago. She was admitted to [facility] and diagnosed with osteomyelitis, and discitis.  She states she had a DVT of her right arm before being admitted. She developed swelling of her LUE while at [facility]. [place] transferred her to [facility] on 10/16 where she was found to have a RUE DVT and LUE superficial occlusive thrombus, osteomyelitis of the thoracic region, discitis, a small paraspinal abscess, and MRSA bacteremia. A nurse mentioned pt had a PE but no evidence of this has  been found. She was treated with heparin and IV Daptomycin. She left AMA on 10/20 because she felt too far away from her family.  Her plan was to come to [facility] but she waited 5 days so she could spend time with her two children first.    Today she rates her back pain at 6/10.  She admits to pain just below bilateral  ribs and in her  epigastric region with movement and palpation which started 1 week ago, states she has itched all over and has had a throbbing headache for the past few days.  She believes she has had a fever and chills at home over the past week.  She denies SOB, chest pain, and vision changes.   She has been alternating ibuprofen and tylenol every 4-6 hours at home along with goody's powder.  She denies an chronic disease.  She states she has had a hysterectomy, appendectomy, and tubal ligation.  Pt has smokes 1 ppd for the past 16 years, denies alcohol use, admits to a past history of injecting meth. She states the last time she injected was 11 months ago.     Review Of Systems:   Review of Systems  Constitutional: Positive for chills and fever.  Gastrointestinal: Positive for abdominal pain. Negative for nausea and vomiting.  Musculoskeletal: Positive for back pain.  Skin: Positive for itching.  Neurological: Positive for headaches.    Patient Active Problem List   Diagnosis Date Noted  . Osteomyelitis of thoracic region (HCC) 02/06/2020  . IVDU (intravenous drug user) 02/06/2020  . 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 Suicidal ideation 08/04/2017  . PTSD (post-traumatic stress disorder) 08/04/2017    Past Medical History: Past Medical History:  Diagnosis Date  . Back pain, chronic   . Bipolar 1 disorder (HCC)   . Constipation   . Pneumonia   . Prolapsed uterus     Past Surgical History: Past Surgical History:  Procedure Laterality Date  . ABDOMINAL HYSTERECTOMY    . APPENDECTOMY    . TUBAL LIGATION    . WISDOM TOOTH EXTRACTION      Social History: Social History   Tobacco Use  . Smoking status: Current Every Day Smoker    Types: Cigarettes  . Smokeless tobacco: Never Used  Vaping Use  . Vaping Use: Never used  Substance Use Topics  . Alcohol use: No  . Drug use: Yes    Types: Methamphetamines, Oxycodone, Fentanyl, Heroin    Comment: Molly     Please  also refer to relevant sections of EMR.  Family History: No family history on file.  Mother is alive and has CHF, father died at 34 from a MI.  Diabetes and HTN run in the family.   Allergies and Medications: Allergies  Allergen Reactions  . Azithromycin Hives  . Ciprofloxacin Hives  . Menthol     Other reaction(s): Unknown  . Morphine And Related Hives and Other (See Comments)    "it was really bad"  . Prednisone Other (See Comments)    "makes face get red and feel hot"  . Vancomycin Hives  . Ceftriaxone Rash    generalized  . Lidocaine Rash    Localized with topical lidocaine  . Penicillins Other (See Comments)     Has patient had a PCN reaction causing immediate rash, facial/tongue/throat swelling, SOB or lightheadedness with hypotension: yes Has patient had a PCN reaction causing severe rash involving mucus membranes or skin necrosis:  no Has patient  had a PCN reaction that required hospitalization: no Has patient had a PCN reaction occurring within the last 10 years: yes If all of the above answers are "NO", then may proceed with Cephalosporin use.    No current facility-administered medications on file prior to encounter.   Current Outpatient Medications on File Prior to Encounter  Medication Sig Dispense Refill  . albuterol (PROVENTIL HFA;VENTOLIN HFA) 108 (90 Base) MCG/ACT inhaler Inhale 2-4 puffs by mouth every 4 hours as needed for wheezing, cough, and/or shortness of breath (Patient not taking: Reported on 08/03/2017) 1 Inhaler 1    Objective: BP 117/77 (BP Location: Right Arm)   Pulse (!) 113   Temp 98.2 F (36.8 C) (Oral)   Resp 16   Ht 5' 7.01" (1.702 m)   Wt 73.4 kg   LMP 07/10/2014   SpO2 99%   BMI 25.34 kg/m  Exam: General: alert, in no acute distress Eyes: PERRLA ENTM: poor dentition, moist oral mucosa Neck: no JVD, trachea midline Cardiovascular: RRR, no murmur, S1 and S2 normal  Respiratory: CTA bilaterally Gastrointestinal: non distender,  tender to palpation for upper abdomen MSK: 5/5 muscle strength in the LE b/l with dorsiflexion and plantar flexion. Derm: multiple tattoos, abdominal striae, no rashes Neuro: CN 2-12 intact Psych: restless, constantly moving hands or feet.  Speech slightly hurried.   Labs and Imaging: CBC BMET  Recent Labs  Lab 05/22/20 1223  WBC 7.1  HGB 11.1*  HCT 36.5  PLT 385   Recent Labs  Lab 05/22/20 1223  NA 139  K 4.2  CL 99  CO2 23  BUN 11  CREATININE 0.78  GLUCOSE 117*  CALCIUM 9.5        Howard Pouch, Medical Student 05/22/2020, 5:57 PM OMS-IV AI, [facility] FPTS Intern pager: [phone], text pages welcome

## 2020-05-22 NOTE — Progress Notes (Signed)
Left upper extremity venous duplex completed. Refer to "CV Proc" under chart review to view preliminary results.  Preliminary results discussed with Mardella Layman, PA-C.  05/22/2020 6:20 PM Eula Fried., MHA, RVT, RDCS, RDMS

## 2020-05-22 NOTE — ED Notes (Signed)
Patient placed on Tel Box

## 2020-05-22 NOTE — Progress Notes (Signed)
2145 Paged Dr. Idalia Needle via Loretha Stapler about patient wanting something else for pain besides Tylenol.   2154 Spoke with Dr. Idalia Needle and Dr. Allena Katz and they agreed to up the dose of Tylenol from 650 mg to 1000 mg due to patient's history of drug abuse.   2230 Spoke with the patient and offered her the 1000 mg Tylenol. Patient says that she takes about 8-10 Tylenols per day at home to get relief and said that more Tylenol will not help. She said "I want to go back to sleep". Will continue to monitor.

## 2020-05-22 NOTE — ED Triage Notes (Signed)
Pt here from home with c/o chronic infection in her spine that she was at [facility] for but left ama  Prior to having surgery

## 2020-05-22 NOTE — Progress Notes (Signed)
Pharmacy Antibiotic Note  Sara Bradshaw is a 29 y.o. 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  Pharmacy has been consulted for daptomycin dosing. This is day 1 of therapy here. Pt has extensive abx history from multiple admissions dating back to 01/2020 for this spinal osteomyelitis and associated MRSA bacteremia. Pt has left AMA multiple times from different facilities for a variety of reasons and therefore has not completed treatment. Pt has documented history of allergy to vancomycin with hives reaction (per [facility] records). ID to be contacted regarding this patient.    Plan: Daptomycin 8mg /kg (590mg ) IV q24h  Monitor weekly CK, renal function, clinical progress, and micro data   Height: 5' 7.01" (170.2 cm) Weight: 73.4 kg (161 lb 13.1 oz) IBW/kg (Calculated) : 61.62  Temp (24hrs), Avg:98.2 F (36.8 C), Min:98.2 F (36.8 C), Max:98.2 F (36.8 C)  Recent Labs  Lab 05/22/20 1223  WBC 7.1  CREATININE 0.78  LATICACIDVEN 1.6    Estimated Creatinine Clearance: 100.9 mL/min (by C-G formula based on SCr of 0.78 mg/dL).    Allergies  Allergen Reactions  . Azithromycin Hives  . Ciprofloxacin Hives  . Morphine And Related     Hives, and "it was really bad"  . Prednisone Other (See Comments)    "makes face get red and feel hot"  . Penicillins Other (See Comments)     Has patient had a PCN reaction causing immediate rash, facial/tongue/throat swelling, SOB or lightheadedness with hypotension: yes Has patient had a PCN reaction causing severe rash involving mucus membranes or skin necrosis:  no Has patient had a PCN reaction that required hospitalization: no Has patient had a PCN reaction occurring within the last 10 years: yes If all of the above answers are "NO", then may proceed with Cephalosporin use.     Antimicrobials this admission: dapto 10/25>   Microbiology results: 10/25 Bcx: ordered 10/16 Bcx x2: MRSA ([facility] records) 10/14 Bcx x4: MRSA ([facility]  records)   Thank you for allowing pharmacy to be a part of this patient's care.  11/16  PGY1 Pharmacy Resident 05/22/2020 5:30 PM

## 2020-05-22 NOTE — Progress Notes (Signed)
ANTICOAGULATION CONSULT NOTE - Initial Consult  Pharmacy Consult for heparin gtt Indication: PE and RU DVT  Allergies  Allergen Reactions  . Azithromycin Hives  . Ciprofloxacin Hives  . Morphine And Related     Hives, and "it was really bad"  . Prednisone Other (See Comments)    "makes face get red and feel hot"  . Penicillins Other (See Comments)     Has patient had a PCN reaction causing immediate rash, facial/tongue/throat swelling, SOB or lightheadedness with hypotension: yes Has patient had a PCN reaction causing severe rash involving mucus membranes or skin necrosis:  no Has patient had a PCN reaction that required hospitalization: no Has patient had a PCN reaction occurring within the last 10 years: yes If all of the above answers are "NO", then may proceed with Cephalosporin use.     Patient Measurements: Height: 5' 7.01" (170.2 cm) Weight: 73.4 kg (161 lb 13.1 oz) IBW/kg (Calculated) : 61.62 Heparin Dosing Weight: 73.4kg  Vital Signs: Temp: 98.2 F (36.8 C) (10/25 1214) Temp Source: Oral (10/25 1214) BP: 117/77 (10/25 1435) Pulse Rate: 113 (10/25 1435)  Labs: Recent Labs    05/22/20 1223  HGB 11.1*  HCT 36.5  PLT 385  CREATININE 0.78    Estimated Creatinine Clearance: 100.9 mL/min (by C-G formula based on SCr of 0.78 mg/dL).   Medical History: Past Medical History:  Diagnosis Date  . Back pain, chronic   . Bipolar 1 disorder (HCC)   . Constipation   . Pneumonia   . Prolapsed uterus       Assessment: 29 year old female found to have RU DVT and small PE per notes from prior hospitalization at [facility] from 10/17. Pt left AMA after receiving heparin gtt and lovenox full dose anticoagulation x3 days of treatment total during that admission. Pharmacy consulted to dose heparin No anticoagulants reported PTA. I spoke with patient and she reported no medications at all PTA; CBC Hgb 11.1; Plt 385  Goal of Therapy:  Heparin level 0.3-0.7 units/ml Monitor  platelets by anticoagulation protocol: Yes   Plan:  Heparin bolus 4400 units x1 IV Heparin infusion 1300 units/hr  6h HL at 0000 on 10/26 and daily HL thereafter Monitor CBC, s/sx bleeding, HL, and clinical progress  De Burrs  PGY1 Pharmacy resident 05/22/2020,5:21 PM

## 2020-05-22 NOTE — ED Provider Notes (Signed)
[facility] EMERGENCY DEPARTMENT Provider Note   CSN: [id number] Arrival date & time: 05/22/20  1208     History No chief complaint on file.   Sara Bradshaw is a 29 y.o. female with PMH/o Back pain, Bipolar 1 disorder, IVDU, discitis and osteomyelitis who presents today for concerns of ongoing discitis and osteomyelitis. Patient was admitted to [facility] on 05/13/20 after presenting to [facility] where she was found to have discitis and osteomyelitis of her T spine as well as RUE DVT (secondary to PICC line) and bilateral PE. She was also noted to have MRSA bacteremia. She was transferred to [facility] for further treatment and evaluation by neurosurgery. Patient left AMA on 05/17/20 for personal reasons. She comes to the ED today for continued back pain, LUE pain. She states she has not taken any antibiotics or blood thinners since leaving the hospital. She states that she had PICC line in the RUE which is where they thought the DVT  Came from. She has had some bruising and pain in the LUE but states she never got a PICC line in there. She has had some mild SOB that she really only notices when she walks around. No CP. She is still having back pain and is wearing a TSLO brace. She has been able to ambulate. She states she has had a fever up to 103 since leaving [facility]. She denies any saddle anesthesia or urinary or bowel incontinence. She denies any IV drug use since leaving [facility]. She denies any abdominal pain, nausea.   The history is provided by the patient.       Past Medical History:  Diagnosis Date  . Back pain, chronic   . Bipolar 1 disorder (HCC)   . Constipation   . Pneumonia   . Prolapsed uterus     Patient Active Problem List   Diagnosis Date Noted  . Osteomyelitis (HCC) 05/22/2020  . Osteomyelitis of thoracic region (HCC) 02/06/2020  . IVDU (intravenous drug user) 02/06/2020  . 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 Suicidal ideation 08/04/2017  .  PTSD (post-traumatic stress disorder) 08/04/2017    Past Surgical History:  Procedure Laterality Date  . ABDOMINAL HYSTERECTOMY    . APPENDECTOMY    . TUBAL LIGATION    . WISDOM TOOTH EXTRACTION       OB History    Gravida  4   Para  3   Term  2   Preterm  1   AB  1   Living  3     SAB  1   TAB      Ectopic      Multiple      Live Births  3           No family history on file.  Social History   Tobacco Use  . Smoking status: Current Every Day Smoker    Types: Cigarettes  . Smokeless tobacco: Never Used  Vaping Use  . Vaping Use: Never used  Substance Use Topics  . Alcohol use: No  . Drug use: Yes    Types: Methamphetamines, Oxycodone, Fentanyl, Heroin    Comment: Molly     Home Medications Prior to Admission medications   Medication Sig Start Date End Date Taking? Authorizing Provider  albuterol (PROVENTIL HFA;VENTOLIN HFA) 108 (90 Base) MCG/ACT inhaler Inhale 2-4 puffs by mouth every 4 hours as needed for wheezing, cough, and/or shortness of breath Patient not taking: Reported on 08/03/2017 05/27/17   York Cerise,  Kandee Keen, MD    Allergies    Azithromycin, Ciprofloxacin, Menthol, Morphine and related, Prednisone, Vancomycin, Ceftriaxone, Lidocaine, and Penicillins  Review of Systems   Review of Systems  Constitutional: Positive for fever.  Respiratory: Positive for shortness of breath. Negative for cough.   Cardiovascular: Negative for chest pain.  Gastrointestinal: Negative for abdominal pain, nausea and vomiting.  Genitourinary: Negative for dysuria and hematuria.  Musculoskeletal: Positive for back pain.  Neurological: Negative for weakness, numbness and headaches.  All other systems reviewed and are negative.   Physical Exam Updated Vital Signs BP 117/77 (BP Location: Right Arm)   Pulse (!) 113   Temp 98.2 F (36.8 C) (Oral)   Resp 16   Ht 5' 7.01" (1.702 m)   Wt 73.4 kg   LMP 07/10/2014   SpO2 99%   BMI 25.34 kg/m   Physical  Exam Vitals and nursing note reviewed.  Constitutional:      Appearance: Normal appearance. She is well-developed.  HENT:     Head: Normocephalic and atraumatic.  Eyes:     General: Lids are normal.     Conjunctiva/sclera: Conjunctivae normal.     Pupils: Pupils are equal, round, and reactive to light.  Cardiovascular:     Rate and Rhythm: Normal rate and regular rhythm.     Pulses: Normal pulses.          Radial pulses are 2+ on the right side and 2+ on the left side.     Heart sounds: Normal heart sounds. No murmur heard.  No friction rub. No gallop.   Pulmonary:     Effort: Pulmonary effort is normal.     Breath sounds: Normal breath sounds.     Comments: Lungs clear to auscultation bilaterally.  Symmetric chest rise.  No wheezing, rales, rhonchi. Abdominal:     Palpations: Abdomen is soft. Abdomen is not rigid.     Tenderness: There is no abdominal tenderness. There is no guarding.  Musculoskeletal:        General: Normal range of motion.     Cervical back: Full passive range of motion without pain.     Comments: TSLO brace in place. Tenderness diffusely noted to the mid thoracic spine. No deformity or crepitus noted. Diffuse tenderness noted to the LUE with some overlying soft tissue swelling and ecchymosis. Compartments are soft. No overlying warmth, erythema.   Skin:    General: Skin is warm and dry.     Capillary Refill: Capillary refill takes less than 2 seconds.     Comments: Good distal cap refill. LUE is not dusky in appearance or cool to touch.  Neurological:     Mental Status: She is alert and oriented to person, place, and time.     Comments: Follows commands, Moves all extremities  5/5 strength to BUE and BLE  Sensation intact throughout all major nerve distributions  Psychiatric:        Speech: Speech normal.     ED Results / Procedures / Treatments   Labs (all labs ordered are listed, but only abnormal results are displayed) Labs Reviewed  CBC -  Abnormal; Notable for the following components:      Result Value   Hemoglobin 11.1 (*)    RDW 16.5 (*)    All other components within normal limits  COMPREHENSIVE METABOLIC PANEL - Abnormal; Notable for the following components:   Glucose, Bld 117 (*)    Anion gap 17 (*)    All other components within  normal limits  RAPID URINE DRUG SCREEN, HOSP PERFORMED - Abnormal; Notable for the following components:   Opiates POSITIVE (*)    Amphetamines POSITIVE (*)    All other components within normal limits  RESPIRATORY PANEL BY RT PCR (FLU A&B, COVID)  CULTURE, BLOOD (ROUTINE X 2)  CULTURE, BLOOD (ROUTINE X 2)  LACTIC ACID, PLASMA  LACTIC ACID, PLASMA  CK  HEPARIN LEVEL (UNFRACTIONATED)  HEPARIN LEVEL (UNFRACTIONATED)  COMPREHENSIVE METABOLIC PANEL  CBC  PROTIME-INR  HEPATITIS PANEL, ACUTE    EKG None  Radiology No results found.  Procedures Procedures (including critical care time)  Medications Ordered in ED Medications  DAPTOmycin (CUBICIN) 590 mg in sodium chloride 0.9 % IVPB (has no administration in time range)  heparin bolus via infusion 4,400 Units (has no administration in time range)  heparin ADULT infusion 100 units/mL (25000 units/21650mL sodium chloride 0.45%) (has no administration in time range)  acetaminophen (TYLENOL) tablet 650 mg (has no administration in time range)    Or  acetaminophen (TYLENOL) suppository 650 mg (has no administration in time range)  senna (SENOKOT) tablet 8.6 mg (has no administration in time range)  nicotine (NICODERM CQ - dosed in mg/24 hours) patch 21 mg (has no administration in time range)    ED Course  I have reviewed the triage vital signs and the nursing notes.  Pertinent labs & imaging results that were available during my care of the patient were reviewed by me and considered in my medical decision making (see chart for details).    MDM Rules/Calculators/A&P                          29 y.o. F with past history of  discitis, osteomyelitis of T7-T8 as well as 2 right-sided paravertebral abscess up to 13 mm with MRSA bacteremia as well as right-sided upper extremity DVT and bilateral PE recently left AMA from [facility] on 05/17/2020 who comes to the emergency department for continued pain.  She left AMA for personal reasons and stating that it was too far for.  She comes in today for admission.  She states she has continued back pain and fevers.  No saddle anesthesia, numbness/weakness.  She has had some occasional shortness of breath but no chest pain.  She feels like her left upper extremity slightly swollen and bruised.  She has not been on any antibiotics or blood thinner since leaving [facility].  Initially arrival, she is afebrile, nontoxic-appearing.  Vital signs are stable though she is slightly tachycardic.  On exam, no obvious neuro deficit.  We will plan to repeat labs.  [facility]  At that time, they were starting her on daptomycin for 8 weeks.  Additionally, she had been on heparin.  Neurosurgery had consulted but I do not see any obvious treatment plan.  She had positive blood cultures on 05/13/2020.  Her repeat cultures were negative at the time of her leaving AMA.  The plan was eventually to transition her to SNF for continued treatment.  They had wanted to ruled out a blood clot of her left upper extremity before they reinserted a PICC line to start antibiotics.  Per treatment plan at [facility]:  T7-8 discitis/osteomyelitis with at least 2 small R paravertebral abscesses up to 13 mmwith MRSA bacteremia -ID and neurosurgery consulted. -Patient started back on IV Daptomycin on 10/16 at 8 mg/kg. Reports hives with vanco. Plan is 8 wks tx -Consider transfer back to  referring hospital once blood cultures clear per ID. However per CM this is EMTALA violation. Plan will be for SNF placement  -Comparison with MRI done 03/20/20.Now with 50% loss of height of T7 vertebral body with mild kyphosis. NSG  consulted - no surgery. Qid neurochecks for now. Has outpt X ray and f/u appt 11/17. TLSO brace when out of bed  -Patient has been on and off antibiotics for a few months now. Recurrent AMA, eloping: unclear at this time actually how much and how long she's received antibiotics during that time frame. -MRSA growing from recent 05/11/20 blood cultures.10/16 1 out of 2 cxs +, only became + on 10/18. Repeat cxs 10/18neg so far  -Continueon scheduled acetaminophen 975 mg TID and prn IV dilaudid 0.5 mg q 4 prn pain. Says she felt "weird" with gabapentin so switched to lyrica on 10/18. Can increase dose every 3 days.She says she is still in pain. Does not want lidocaine patch saying it gives rash. Does not want heat/ice. Explained that it does not make sense to treat her with escalating opiates since she will likely have chronic pain and no one will prescribe these. Also notes in careeverywhere that she was previously abusing grandmother's opiate rx. Will increase lyrica on 10/21 and start tapering down dilaudid then   CBC shows no leukocytosis.  Hemoglobin stable 11.1.  CMP shows normal BUN and creatinine.  Lactic is 1.6.  Anion gap is 17.    Ultrasound shows superficial thrombosis and basilic vein.  No DVT.  They did compare her right subclavian and showed that the DVT had extended into the right subclavian.  Patient restarted on heparin and daptomycin per pharmacy.  Will need admission.  Dr.  Discussed with Dr. Venetia Maxon (neurosurgery).  He will plan to consult patient in the morning.  I discussed with family medicine team who accepts patient for admission.  Discussed patient with Dr. Daiva Eves (ID) who recommends starting patient on daptomycin 8-9 mg/kg.  If patient is going to leave AMA again, she may be started on Oritavancin. Pharm aware.   Portions of this note were generated with Scientist, clinical (histocompatibility and immunogenetics). Dictation errors may occur despite best attempts at proofreading.  Final Clinical  Impression(s) / ED Diagnoses Final diagnoses:  Discitis of thoracic region  Osteomyelitis of other site, unspecified type (HCC)  Acute deep vein thrombosis (DVT) of right upper extremity, unspecified vein (HCC)    Rx / DC Orders ED Discharge Orders    None      Portions of this note were generated with Dragon dictation software. Dictation errors may occur despite best attempts at proofreading.     Maxwell Caul, PA-C 05/22/20 1811    Sabas Sous, MD 05/23/20 Rich Fuchs

## 2020-05-23 ENCOUNTER — Inpatient Hospital Stay (HOSPITAL_COMMUNITY): Payer: Self-pay

## 2020-05-23 DIAGNOSIS — M869 Osteomyelitis, unspecified: Secondary | ICD-10-CM

## 2020-05-23 DIAGNOSIS — F1990 Other psychoactive substance use, unspecified, uncomplicated: Secondary | ICD-10-CM

## 2020-05-23 DIAGNOSIS — M4644 Discitis, unspecified, thoracic region: Secondary | ICD-10-CM

## 2020-05-23 DIAGNOSIS — I82621 Acute embolism and thrombosis of deep veins of right upper extremity: Secondary | ICD-10-CM

## 2020-05-23 LAB — COMPREHENSIVE METABOLIC PANEL
ALT: 14 U/L (ref 0–44)
AST: 17 U/L (ref 15–41)
Albumin: 2.9 g/dL — ABNORMAL LOW (ref 3.5–5.0)
Alkaline Phosphatase: 63 U/L (ref 38–126)
Anion gap: 9 (ref 5–15)
BUN: 10 mg/dL (ref 6–20)
CO2: 26 mmol/L (ref 22–32)
Calcium: 8.8 mg/dL — ABNORMAL LOW (ref 8.9–10.3)
Chloride: 101 mmol/L (ref 98–111)
Creatinine, Ser: 0.61 mg/dL (ref 0.44–1.00)
GFR, Estimated: >60 mL/min (ref >60)
Glucose, Bld: 113 mg/dL — ABNORMAL HIGH (ref 70–99)
Potassium: 3.7 mmol/L (ref 3.5–5.1)
Sodium: 136 mmol/L (ref 135–145)
Total Bilirubin: 0.8 mg/dL (ref 0.3–1.2)
Total Protein: 6.5 g/dL (ref 6.5–8.1)

## 2020-05-23 LAB — LACTIC ACID, PLASMA: Lactic Acid, Venous: 1.4 mmol/L (ref 0.5–1.9)

## 2020-05-23 LAB — HEPARIN LEVEL (UNFRACTIONATED): Heparin Unfractionated: 0.14 IU/mL — ABNORMAL LOW (ref 0.30–0.70)

## 2020-05-23 LAB — CBC
HCT: 32.4 % — ABNORMAL LOW (ref 36.0–46.0)
Hemoglobin: 10 g/dL — ABNORMAL LOW (ref 12.0–15.0)
MCH: 27.2 pg (ref 26.0–34.0)
MCHC: 30.9 g/dL (ref 30.0–36.0)
MCV: 88.3 fL (ref 80.0–100.0)
Platelets: 297 10*3/uL (ref 150–400)
RBC: 3.67 MIL/uL — ABNORMAL LOW (ref 3.87–5.11)
RDW: 16.7 % — ABNORMAL HIGH (ref 11.5–15.5)
WBC: 6.2 10*3/uL (ref 4.0–10.5)
nRBC: 0 % (ref 0.0–0.2)

## 2020-05-23 LAB — PROTIME-INR
INR: 1 (ref 0.8–1.2)
Prothrombin Time: 12.7 seconds (ref 11.4–15.2)

## 2020-05-23 LAB — HEPATITIS PANEL, ACUTE
HCV Ab: REACTIVE — AB
Hep A IgM: NONREACTIVE
Hep B C IgM: NONREACTIVE
Hepatitis B Surface Ag: NONREACTIVE

## 2020-05-23 LAB — HCV RNA QUANT: HCV Quantitative: NOT DETECTED IU/mL (ref >50)

## 2020-05-23 LAB — C-REACTIVE PROTEIN: CRP: 2.5 mg/dL — ABNORMAL HIGH (ref <1.0)

## 2020-05-23 LAB — SEDIMENTATION RATE: Sed Rate: 52 mm/hr — ABNORMAL HIGH (ref 0–22)

## 2020-05-23 MED ORDER — OXYCODONE HCL 5 MG PO TABS
2.5000 mg | ORAL_TABLET | ORAL | Status: DC | PRN
  Administered 2020-05-23 – 2020-05-24 (×2): 2.5 mg via ORAL
  Filled 2020-05-23 (×2): qty 1

## 2020-05-23 MED ORDER — HEPARIN BOLUS VIA INFUSION
2000.0000 [IU] | Freq: Once | INTRAVENOUS | Status: AC
  Administered 2020-05-23: 2000 [IU] via INTRAVENOUS
  Filled 2020-05-23: qty 2000

## 2020-05-23 MED ORDER — DIPHENHYDRAMINE HCL 50 MG/ML IJ SOLN
12.5000 mg | Freq: Once | INTRAMUSCULAR | Status: AC
  Administered 2020-05-23: 12.5 mg via INTRAVENOUS
  Filled 2020-05-23: qty 1

## 2020-05-23 MED ORDER — CLONIDINE HCL 0.1 MG PO TABS
0.1000 mg | ORAL_TABLET | Freq: Two times a day (BID) | ORAL | Status: DC
  Administered 2020-05-23 – 2020-05-25 (×4): 0.1 mg via ORAL
  Filled 2020-05-23 (×5): qty 1

## 2020-05-23 MED ORDER — DICLOFENAC SODIUM 1 % EX GEL
4.0000 g | Freq: Four times a day (QID) | CUTANEOUS | Status: DC
  Administered 2020-05-23 – 2020-05-24 (×2): 4 g via TOPICAL
  Filled 2020-05-23: qty 100

## 2020-05-23 MED ORDER — IBUPROFEN 200 MG PO TABS: 400.0000 mg | ORAL_TABLET | Freq: Three times a day (TID) | ORAL | Status: DC

## 2020-05-23 MED ORDER — ENOXAPARIN SODIUM 80 MG/0.8ML ~~LOC~~ SOLN
70.0000 mg | Freq: Two times a day (BID) | SUBCUTANEOUS | Status: DC
  Administered 2020-05-23 – 2020-05-25 (×6): 70 mg via SUBCUTANEOUS
  Filled 2020-05-23 (×6): qty 0.7

## 2020-05-23 MED ORDER — ORITAVANCIN DIPHOSPHATE 400 MG IV SOLR
1200.0000 mg | Freq: Once | INTRAVENOUS | Status: AC
  Administered 2020-05-23: 1200 mg via INTRAVENOUS
  Filled 2020-05-23: qty 120

## 2020-05-23 MED ORDER — ACETAMINOPHEN 500 MG PO TABS
1000.0000 mg | ORAL_TABLET | Freq: Four times a day (QID) | ORAL | Status: DC
  Administered 2020-05-23 – 2020-05-25 (×6): 1000 mg via ORAL
  Filled 2020-05-23 (×5): qty 2

## 2020-05-23 MED ORDER — ACETAMINOPHEN 500 MG PO TABS
1000.0000 mg | ORAL_TABLET | Freq: Four times a day (QID) | ORAL | Status: DC
  Filled 2020-05-23: qty 2

## 2020-05-23 NOTE — Progress Notes (Signed)
Heparin fluid bolus of 2000 units initated. Rate change from 13 ml/hr to 14.5 ml/hr will resume after bolus is finished. Will continue to monitor.

## 2020-05-23 NOTE — Progress Notes (Signed)
FPTS Interim Progress Note  S: Evaluated patient at bedside due to concern for itching.  Patient reports itching on her chest wall, palms, and back of her neck which started not too long ago.  Oritavancin was started at 1232, to be administered over 180 minutes.  Patient denies issues with itchiness normally.  No rash noted.  Denies sore throat, shortness of breath.  O: BP 113/75 (BP Location: Left Arm)   Pulse 89   Temp 98.5 F (36.9 C) (Oral)   Resp 18   Ht 5' 7.01" (1.702 m)   Wt 73.4 kg   LMP 07/10/2014   SpO2 98%   BMI 25.34 kg/m   Gen: uncomfortable appearing female, NAD Pulm: CTAB, good air movement, no wheezes Derm: no rash seen on palms, chest wall, and posterior neck  A/P: Pruritis Patient with generalized pruritus without evidence of urticaria or anaphylactic reaction. Possibly adverse reaction to oritavancin, though incidence of pruritis reported to be <2%. Antibiotics nearing completion, no need to discontinue them or change rate at this time. - one time dose of IV diphenhydramine 12.5 mg  Littie Deeds, MD 05/23/2020, 3:28 PM PGY-1, [facility] Family Medicine Service pager [phone]

## 2020-05-23 NOTE — Evaluation (Signed)
Occupational Therapy Evaluation/Discharge Patient Details Name: Sara Bradshaw MRN: [id number] DOB: 01/08/1991 Today's Date: 05/23/2020    History of Present Illness Sara Bradshaw is a 29 y.o. female presenting with back pain. She was admitted to [facility] on 10/16 and was diagnosed with osteomyelitis, discitis, RUE DVT, LUE superficial thrombus,  and MRSA bacteremia. Left AMA on 10/20 and subsequently returned to [facility] on 10/25. PMH is significant for IVDU. She was being treated with IV Daptomycin and heparin but left AMA on 10/20   Clinical Impression   PTA, pt reports independence with all daily tasks without use of AD. Initially, pt observed rolling without assist in bed, yelling out and requesting more pain meds. Offered to reposition, educate on log roll to minimize pain, and use of modalities but pt refused reporting she cannot move or do anything with therapy at this time. Educated on possible DME to increase ability to manage at home, but pt perseverating on pain meds. On OT exit, observed pt get out of bed, walk to bathroom and toilet self Independently without use of DME. No OT needs indicated at acute level or on discharge. OT to sign off.     Follow Up Recommendations  No OT follow up    Equipment Recommendations  None recommended by OT    Recommendations for Other Services       Precautions / Restrictions Precautions Precautions: Fall Restrictions Weight Bearing Restrictions: No      Mobility Bed Mobility Overal bed mobility: Independent                  Transfers Overall transfer level: Independent Equipment used: None             General transfer comment: Observed to walk to bathroom independently     Balance Overall balance assessment: Modified Independent                                         ADL either performed or assessed with clinical judgement   ADL Overall ADL's : At baseline                                        General ADL Comments: Pt noted to walk to bathroom, toilet self independently without AD. No LOB noted      Vision   Vision Assessment?: No apparent visual deficits     Perception     Praxis      Pertinent Vitals/Pain Pain Assessment: 0-10 Pain Score: 10-Worst pain ever Pain Location: upper back Pain Descriptors / Indicators: Grimacing;Guarding Pain Intervention(s): Limited activity within patient's tolerance;Monitored during session;Repositioned;Patient requesting pain meds-RN notified     Hand Dominance Right   Extremity/Trunk Assessment Upper Extremity Assessment Upper Extremity Assessment: Overall WFL for tasks assessed   Lower Extremity Assessment Lower Extremity Assessment: Defer to PT evaluation   Cervical / Trunk Assessment Cervical / Trunk Assessment: Normal   Communication Communication Communication: No difficulties   Cognition Arousal/Alertness: Awake/alert Behavior During Therapy: Restless;Agitated Overall Cognitive Status: No family/caregiver present to determine baseline cognitive functioning Area of Impairment: Safety/judgement;Awareness                         Safety/Judgement: Decreased awareness of safety Awareness: Emergent   General Comments:  Pt agitated, perseverating on pain and declines to move. Pt reports plan to leave AMA today. Reports unable to get out of bed, but observed walking in room    General Comments  Pt restless, agitated, focused on pain med need. Offered log roll education to ease back pain, repositioning with pilllows, ice, etc but pt declined reporting too much pain to move    Exercises     Shoulder Instructions      Home Living Family/patient expects to be discharged to:: Private residence Living Arrangements: Spouse/significant other Available Help at Discharge: Family;Available PRN/intermittently                         Home Equipment: None   Additional Comments:  Home setup information limited due to pt complaints of pain. Pt reports her husband works so cannot be home with her all of the time      Prior Functioning/Environment Level of Independence: Independent                 OT Problem List:        OT Treatment/Interventions:      OT Goals(Current goals can be found in the care plan section) Acute Rehab OT Goals Patient Stated Goal: get pain meds  OT Frequency:     Barriers to D/C:            Co-evaluation              AM-PAC OT "6 Clicks" Daily Activity     Outcome Measure Help from another person eating meals?: None Help from another person taking care of personal grooming?: None Help from another person toileting, which includes using toliet, bedpan, or urinal?: None Help from another person bathing (including washing, rinsing, drying)?: None Help from another person to put on and taking off regular upper body clothing?: None Help from another person to put on and taking off regular lower body clothing?: None 6 Click Score: 24   End of Session Nurse Communication: Mobility status;Patient requests pain meds  Activity Tolerance: Patient limited by pain Patient left: in bed;with call bell/phone within reach;with bed alarm set                   Time: 1135-1148 OT Time Calculation (min): 13 min Charges:  OT General Charges $OT Visit: 1 Visit OT Evaluation $OT Eval Low Complexity: 1 Low  Lorre Munroe, OTR/L  Lorre Munroe 05/23/2020, 12:37 PM

## 2020-05-23 NOTE — Plan of Care (Signed)
  Problem: Clinical Measurements: Goal: Ability to maintain clinical measurements within normal limits will improve Outcome: Progressing Goal: Will remain free from infection Outcome: Progressing Goal: Diagnostic test results will improve Outcome: Progressing Goal: Respiratory complications will improve Outcome: Progressing Goal: Cardiovascular complication will be avoided Outcome: Progressing   Problem: Pain Managment: Goal: General experience of comfort will improve Outcome: Progressing   Problem: Coping: Goal: Level of anxiety will decrease Outcome: Progressing   Problem: Skin Integrity: Goal: Risk for impaired skin integrity will decrease Outcome: Progressing

## 2020-05-23 NOTE — Progress Notes (Signed)
Patient refused meds. " I dont want it so you dont need to scan me."

## 2020-05-23 NOTE — Progress Notes (Signed)
ANTICOAGULATION CONSULT NOTE   Pharmacy Consult for Heparin  Indication: PE and RU DVT  Allergies  Allergen Reactions  . Azithromycin Hives  . Ciprofloxacin Hives  . Menthol     Other reaction(s): Unknown  . Morphine And Related Hives and Other (See Comments)    "it was really bad"  . Prednisone Other (See Comments)    "makes face get red and feel hot"  . Vancomycin Hives  . Ceftriaxone Rash    generalized  . Lidocaine Rash    Localized with topical lidocaine  . Penicillins Other (See Comments)     Has patient had a PCN reaction causing immediate rash, facial/tongue/throat swelling, SOB or lightheadedness with hypotension: yes Has patient had a PCN reaction causing severe rash involving mucus membranes or skin necrosis:  no Has patient had a PCN reaction that required hospitalization: no Has patient had a PCN reaction occurring within the last 10 years: yes If all of the above answers are "NO", then may proceed with Cephalosporin use.     Patient Measurements: Height: 5' 7.01" (170.2 cm) Weight: 73.4 kg (161 lb 13.1 oz) IBW/kg (Calculated) : 61.62 Heparin Dosing Weight: 73.4kg  Vital Signs: Temp: 98 F (36.7 C) (10/25 2107) Temp Source: Oral (10/25 2107) BP: 119/77 (10/25 2107) Pulse Rate: 98 (10/25 2107)  Labs: Recent Labs    05/22/20 1223 05/22/20 1844 05/23/20 0137  HGB 11.1*  --  10.0*  HCT 36.5  --  32.4*  PLT 385  --  297  LABPROT  --   --  12.7  INR  --   --  1.0  HEPARINUNFRC  --   --  0.14*  CREATININE 0.78  --  0.61  CKTOTAL  --  39  --     Estimated Creatinine Clearance: 100.9 mL/min (by C-G formula based on SCr of 0.61 mg/dL).   Medical History: Past Medical History:  Diagnosis Date  . Back pain, chronic   . Bipolar 1 disorder (HCC)   . Constipation   . Pneumonia   . Prolapsed uterus       Assessment: 29 year old female found to have RU DVT and small PE per notes from prior hospitalization at [facility] from 10/17. Pt left AMA after  receiving heparin gtt and lovenox full dose anticoagulation x3 days of treatment total during that admission. Pharmacy consulted to dose heparin No anticoagulants reported PTA. I spoke with patient and she reported no medications at all PTA; CBC Hgb 11.1; Plt 385  10/26 AM update:  Heparin level low  Goal of Therapy:  Heparin level 0.3-0.7 units/ml Monitor platelets by anticoagulation protocol: Yes   Plan:  Heparin 2000 units re-bolus Inc heparin to 1450 units/hr 1000 heparin level Monitor CBC, s/sx bleeding, HL, and clinical progress  Abran [facility], PharmD, BCPS Clinical Pharmacist Phone: [phone]

## 2020-05-23 NOTE — Progress Notes (Signed)
Echo attempted at 2:20 on 10/26. Patient requesting [place] to come back later due to pain level.  [facility] Becket Wecker RDCS

## 2020-05-23 NOTE — Evaluation (Addendum)
Physical Therapy Evaluation and Discharge  Patient Details Name: Sara Bradshaw MRN: [id number] DOB: 06-Jul-1991 Today's Date: 05/23/2020   History of Present Illness  Sara Bradshaw is a 29 y.o. female presenting with back pain. She was admitted to [facility] on 10/16 and was diagnosed with osteomyelitis, discitis, RUE DVT, LUE superficial thrombus,  and MRSA bacteremia. Left AMA on 10/20 and subsequently returned to [facility] on 10/25. PMH is significant for IVDU. She was being treated with IV Daptomycin and heparin but left AMA on 10/20  Clinical Impression  PTA, patient states independence with all mobility. Patient able to log roll independently on arrival, yelling, and requesting pain meds. Discussed and educated patient on pain management techniques,i.e repositioning, modalities, log roll technique, however patient refused OOB mobility due to excruciating pain and inability to move due to possible flare up. Perseverated on pain and needing pain meds throughout session. On PT exit, bed alarm sounding and observed patient get OOB and ambulated to bathroom independently with no AD. No skilled PT needs during acute stay and no follow up PT recommended. PT to sign off.    Follow Up Recommendations No PT follow up    Equipment Recommendations       Recommendations for Other Services       Precautions / Restrictions Precautions Precautions: Fall Restrictions Weight Bearing Restrictions: No      Mobility  Bed Mobility Overal bed mobility: Independent                  Transfers Overall transfer level: Independent Equipment used: None             General transfer comment: Observed to walk to bathroom independently   Ambulation/Gait Ambulation/Gait assistance: Independent Gait Distance (Feet): 20 Feet Assistive device: None Gait Pattern/deviations: Step-through pattern     General Gait Details: Shortly after leaving, patient ambulated to bathroom with no  AD and no assistance after refusing OOB mobility due to excruciating pain  Stairs            Wheelchair Mobility    Modified Rankin (Stroke Patients Only)       Balance Overall balance assessment: Modified Independent                                           Pertinent Vitals/Pain Pain Assessment: 0-10 Pain Score: 10-Worst pain ever Pain Location: upper back Pain Descriptors / Indicators: Grimacing;Guarding Pain Intervention(s): Limited activity within patient's tolerance;Monitored during session;Repositioned;Patient requesting pain meds-RN notified    Home Living Family/patient expects to be discharged to:: Private residence Living Arrangements: Spouse/significant other Available Help at Discharge: Family;Available PRN/intermittently           Home Equipment: None Additional Comments: Home setup information limited due to pt complaints of pain. Pt reports her husband works so cannot be home with her all of the time    Prior Function Level of Independence: Independent               Hand Dominance   Dominant Hand: Right    Extremity/Trunk Assessment   Upper Extremity Assessment Upper Extremity Assessment: Defer to OT evaluation    Lower Extremity Assessment Lower Extremity Assessment: Overall WFL for tasks assessed    Cervical / Trunk Assessment Cervical / Trunk Assessment: Normal  Communication   Communication: No difficulties  Cognition Arousal/Alertness: Awake/alert Behavior During Therapy: Restless;Agitated  Overall Cognitive Status: No family/caregiver present to determine baseline cognitive functioning Area of Impairment: Safety/judgement;Awareness                         Safety/Judgement: Decreased awareness of safety Awareness: Emergent   General Comments: Pt agitated, perseverating on pain and declines to move. Pt reports plan to leave AMA today. Reports unable to get out of bed, but observed walking in  room with no AD and no assistance      General Comments General comments (skin integrity, edema, etc.): Pt agitated and focused on pain med need. Offered techniques for pain management, i.e. repositioning, log roll, ice/heat, etc., however patient declined and stated if she didn't get her pain med in 30 minutes, she will leave AMA again. notified RN     Exercises     Assessment/Plan    PT Assessment Patent does not need any further PT services  PT Problem List         PT Treatment Interventions      PT Goals (Current goals can be found in the Care Plan section)  Acute Rehab PT Goals Patient Stated Goal: get pain meds PT Goal Formulation: With patient Potential to Achieve Goals: Good    Frequency     Barriers to discharge        Co-evaluation               AM-PAC PT "6 Clicks" Mobility  Outcome Measure Help needed turning from your back to your side while in a flat bed without using bedrails?: None Help needed moving from lying on your back to sitting on the side of a flat bed without using bedrails?: None Help needed moving to and from a bed to a chair (including a wheelchair)?: None Help needed standing up from a chair using your arms (e.g., wheelchair or bedside chair)?: None Help needed to walk in hospital room?: None Help needed climbing 3-5 steps with a railing? : None 6 Click Score: 24    End of Session   Activity Tolerance: Patient limited by pain Patient left: in bed;with call bell/phone within reach;with bed alarm set Nurse Communication: Mobility status;Patient requests pain meds PT Visit Diagnosis: Unsteadiness on feet (R26.81);Muscle weakness (generalized) (M62.81);Pain Pain - part of body:  (upper back)    Time: 2130-8657 PT Time Calculation (min) (ACUTE ONLY): 12 min   Charges:   PT Evaluation $PT Eval Low Complexity: 1 Low          Gregor Hams, PT, DPT Acute Rehabilitation Services Pager [phone] Office  [phone]   Sara Bradshaw 05/23/2020, 1:05 PM

## 2020-05-23 NOTE — Progress Notes (Signed)
Patient is still moaning and c/o pain but is refusing the Tylenol. MD on call will not give me more than that due to history of IVD abuse . Explained to the patient to talk with attending today regarding pain management. Will continue to monitor.

## 2020-05-23 NOTE — Consult Note (Signed)
[facility]    Date of Admission: 05/22/2020 Reason for Consult: Osteomyelitis, discitis    Referring Physician: Dr. Leveda Anna  Assessment: Sara Bradshaw is 29yo female with history of IV drug use, bipolar one disorder admitted for chronic osteomyelitis/discitis with incomplete treatment over the last few months due to leaving AMA multiple times over the last few months. Patient is at high risk for leaving AMA again and poor follow-up given her unfortunate social circumstances. Plan to obtain TTE again today (last TTE 01/2020 w/o vegetations) and give oritavancin. Will continue daptomycin as well. Will also f/u hep C lab results.  Plan: - Continue with daptomycin - Adding oritavancin given patient's history - Obtain TTE - F/u hepatitis C RNA quant (Hep C Ab +) - Will continue discussions with patient regarding importance of staying in hospital for treatment and follow-up appointments - F/u with neurosurgery  Antibiotics: - daptomycin (day 2) - oritavancin (day 1)  HPI: Sara Bradshaw is a 29 y.o. female with history of IV drug use, bipolar one disorder admitted 10/25 with back pain s/p multiple hospitalizations for osteomyelitis and discitis of thoracic spine with incomplete treatment due to frequent AMA.  History was obtained via chart review. Originally was seen at [facility] in July 2021, found to have T7-T8 osteomyelitis, discitis with small paraspinal abscess. Neurosurgery at the time recommended conservative management. Patient was given vancomycin and cefepime prior to leaving AMA after 1 day.  She was seen again was seen again in August at [facility] and was reportedly started on daptomycin and levaquin prior to leaving AMA. Patient was then admitted to [facility] 04/21/20, where a RUE PICC line was placed in ED prior to patient eloping. She was then seen again at the [facility] ED on 05/11/20 and was found to have bilateral pulmonary emboli and RUE DVT  around PICC line. PICC line was removed, BCx drawn, and she was re-started on levaqiun and daptomycin prior to transfer to [facility]. BCx from 10/14 grew MRSA x4. Imaging at [facility] showed progressive erosive changes at T7-T8 with discitis/osteomyelitis with extensive paravertebral soft tissue swelling and two paravertebral abscesses, no cord compression. While at [facility], patient received daptomycin and levaquin prior to leaving AMA on 10/20. Patient presented at [facility] ED 10/25 with continued back pain. Denies chest pain, shortness of breath, abdominal pain, leg pain/swelling. In ED, Neurosurgery and ID consulted and patient was re-started on daptomycin.  Review of Systems: All other systems reviewed and are negative    Past Medical History:  Diagnosis Date   Back pain, chronic    Bipolar 1 disorder (HCC)    Constipation    Pneumonia    Prolapsed uterus     Social History   Tobacco Use   Smoking status: Current Every Day Smoker    Types: Cigarettes   Smokeless tobacco: Never Used  Vaping Use   Vaping Use: Never used  Substance Use Topics   Alcohol use: No   Drug use: Not Currently    Types: Methamphetamines, Oxycodone, Fentanyl, Heroin    Comment: Molly    History reviewed. No pertinent family history.  Allergies  Allergen Reactions   Azithromycin Hives   Ciprofloxacin Hives   Menthol     Other reaction(s): Unknown   Morphine And Related Hives and Other (See Comments)    "it was really bad"   Prednisone Other (See Comments)    "makes face get red and feel hot"   Vancomycin Hives   Ceftriaxone  Rash    generalized   Lidocaine Rash    Localized with topical lidocaine   Penicillins Other (See Comments)     Has patient had a PCN reaction causing immediate rash, facial/tongue/throat swelling, SOB or lightheadedness with hypotension: yes Has patient had a PCN reaction causing severe rash involving mucus membranes or skin necrosis:  no Has patient had a PCN reaction  that required hospitalization: no Has patient had a PCN reaction occurring within the last 10 years: yes If all of the above answers are "NO", then may proceed with Cephalosporin use.     Physical Exam: Vitals:   05/23/20 0400 05/23/20 0931  BP: 130/80 113/75  Pulse: 87 89  Resp:  18  Temp: 97.7 F (36.5 C) 98.5 F (36.9 C)  SpO2: 100% 98%   Constitutional: Ill-appearing, in obvious pain HENT: Normocephalic, atraumatic. Poor dentition. EYES: anicteric CV: Tachycardic, regular rhythm. No m/r/g Pulm: Clear to auscultation bilaterally. No increased work of breathing. GI:  Soft, non-tender, non-distended MSK:  Exquisitely tender to touch in thoracic spinal processes. Mild tenderness lumbar region. Skin: No jaundice present. Warm, dry. Neuro: Alert, awake, oriented x3.  Lab Results  Component Value Date   WBC 6.2 05/23/2020   HGB 10.0 (L) 05/23/2020   HCT 32.4 (L) 05/23/2020   MCV 88.3 05/23/2020   PLT 297 05/23/2020    Lab Results  Component Value Date   CREATININE 0.61 05/23/2020   BUN 10 05/23/2020   NA 136 05/23/2020   K 3.7 05/23/2020   CL 101 05/23/2020   CO2 26 05/23/2020    Lab Results  Component Value Date   ALT 14 05/23/2020   AST 17 05/23/2020   ALKPHOS 63 05/23/2020     Microbiology: Recent Results (from the past 240 hour(s))  Blood culture (routine x 2)     Status: None (Preliminary result)   Collection Time: 05/22/20 12:24 PM   Specimen: BLOOD  Result Value Ref Range Status   Specimen Description BLOOD SITE NOT SPECIFIED  Final   Special Requests   Final    BOTTLES DRAWN AEROBIC AND ANAEROBIC Blood Culture adequate volume   Culture   Final    NO GROWTH < 24 HOURS Performed at [facility], [address], [place], [place] [address]    Report Status PENDING  Incomplete  Respiratory Panel by RT PCR (Flu A&B, Covid) - Nasopharyngeal Swab     Status: None   Collection Time: 05/22/20  3:50 PM   Specimen: Nasopharyngeal Swab  Result Value  Ref Range Status   SARS Coronavirus 2 by RT PCR NEGATIVE NEGATIVE Final    Comment: (NOTE) SARS-CoV-2 target nucleic acids are NOT DETECTED.  The SARS-CoV-2 RNA is generally detectable in upper respiratoy specimens during the acute phase of infection. The lowest concentration of SARS-CoV-2 viral copies this assay can detect is 131 copies/mL. A negative result does not preclude SARS-Cov-2 infection and should not be used as the sole basis for treatment or other patient management decisions. A negative result may occur with  improper specimen collection/handling, submission of specimen other than nasopharyngeal swab, presence of viral mutation(s) within the areas targeted by this assay, and inadequate number of viral copies (<131 copies/mL). A negative result must be combined with clinical observations, patient history, and epidemiological information. The expected result is Negative.  Fact Sheet for Patients:  https://www.moore.com/  Fact Sheet for Healthcare Providers:  https://www.young.biz/  This test is no t yet approved or cleared by the [place]  [place] FDA and  has been authorized for detection and/or diagnosis of SARS-CoV-2 by FDA under an Emergency Use Authorization (EUA). This EUA will remain  in effect (meaning this test can be used) for the duration of the COVID-19 declaration under Section 564(b)(1) of the Act, 21 U.S.C. section 360bbb-3(b)(1), unless the authorization is terminated or revoked sooner.     Influenza A by PCR NEGATIVE NEGATIVE Final   Influenza B by PCR NEGATIVE NEGATIVE Final    Comment: (NOTE) The Xpert Xpress SARS-CoV-2/FLU/RSV assay is intended as an aid in  the diagnosis of influenza from Nasopharyngeal swab specimens and  should not be used as a sole basis for treatment. Nasal washings and  aspirates are unacceptable for Xpert Xpress SARS-CoV-2/FLU/RSV  testing.  Fact Sheet for  Patients: https://www.moore.com/  Fact Sheet for Healthcare Providers: https://www.young.biz/  This test is not yet approved or cleared by the [place] FDA and  has been authorized for detection and/or diagnosis of SARS-CoV-2 by  FDA under an Emergency Use Authorization (EUA). This EUA will remain  in effect (meaning this test can be used) for the duration of the  Covid-19 declaration under Section 564(b)(1) of the Act, 21  U.S.C. section 360bbb-3(b)(1), unless the authorization is  terminated or revoked. Performed at [facility], [address], [place], [place] [address]     Evlyn Kanner, MD Internal Medicine PGY-1 [phone]

## 2020-05-23 NOTE — Progress Notes (Signed)
Family Medicine Teaching Service Daily Progress Note Intern Pager: [phone]  Patient name: Sara Bradshaw Medical record number: [id number] Date of birth: [birth year] Age: 29 y.o. Gender: female  Primary Care Provider: Patient, No Pcp Per Consultants: ID, neurosurgery, CCM   Code Status: Full Code  Pt Overview and Major Events to Date:  10/25 Admitted  Assessment and Plan: Sara Bradshaw is a 29 y.o. female who presents with back pain secondary to known osteomyelitis/discitis. PMHx significant for: substance use disorder (IVDU), PTSD.  Osteomyelitis / Discitis Presented with severe back pain. Evidence of osteomyelitis/discitis at T7-8 with progression from 01/08/20 with small ventral epidural abscess noted on MRI thoracic spine 02/06/20. Patient has had multiple hospitalization for this problem due to incomplete treatment due to leaving AMA frequently, most recently was hospitalized at [facility] from 10/16-10/24 leaving AMA. Was receiving daptomycin and levofloxacin while at [facility]. She did have MRSA bacteremia during that hospitalization, which did resolve prior to leaving AMA. Restarted on daptomycin on admission here. Neurosurgery and ID consulted in the ED, appreciate recommendations. Pain not well controlled, will try to optimize non-opioid treatments given her history of substance use disorder. - continue daptomycin (10/25-) - s/p oritavancin (10/26) per CCM - scheduled Tylenol - diclofenac gel - labs: ESR, CRP  DVT, right upper extremity Previously noted to have occlusive right subclavian thrombus and left upper extremity superficial occlusive thrombus while at [facility]. She was receiving AC while admitted, but has not been on [facility] since she left. 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 - switch to enoxaparin, plan to transition to DOAC if no planned procedures  Hepatitis C antibody positive Noted on acute  hepatitis panel. Will follow up with further studies. 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 UDS positive for opiates and amphetamines. Denies drug use in the past year, suspect still using. COWS stable (1, 2).  - monitor COWS  Tobacco use 1 ppd smoker for the past 16 years. - nicotine patch  RUQ pain Reported RUQ and bilateral lower rib pain for the past week. RUQ US unremarkable. Possible hep C causing RUQ pain given positive hep C Ab, though AST/ALT unremarkable. Will f/u RNA test as above. Could also consider extension of osteomyelitis into ribs.   FEN/GI: regular diet PPx: enoxaparin treatment dose  Disposition: med-surg  Subjective:  This morning, patient reporting severe back pain and is unable to move due to pain.  Objective: Temp:  [97.7 F (36.5 C)-98.5 F (36.9 C)] 98.5 F (36.9 C) (10/26 0931) Pulse Rate:  [86-113] 89 (10/26 0931) Resp:  [11-18] 18 (10/26 0931) BP: (107-130)/(72-80) 113/75 (10/26 0931) SpO2:  [97 %-100 %] 98 % (10/26 0931) Weight:  [73.4 kg] 73.4 kg (10/25 1700) Physical Exam: General: Young female, appears in pain Cardiovascular: RRR, no murmur Respiratory: CTAB Extremities: [facility]  Laboratory: Recent Labs  Lab 05/22/20 1223 05/23/20 0137  WBC 7.1 6.2  HGB 11.1* 10.0*  HCT 36.5 32.4*  PLT 385 297   Recent Labs  Lab 05/22/20 1223 05/23/20 0137  NA 139 136  K 4.2 3.7  CL 99 101  CO2 23 26  BUN 11 10  CREATININE 0.78 0.61  CALCIUM 9.5 8.8*  PROT 8.0 6.5  BILITOT 0.6 0.8  ALKPHOS 64 63  ALT 19 14  AST 19 17  GLUCOSE 117* 113*    Imaging/Diagnostic Tests: UE VENOUS DUPLEX ([facility] & [facility] 7 am -  7 pm)  Result Date: 05/22/2020 UPPER VENOUS STUDY  Indications: bruising left medial upper arm, history of recent right upper extremity DVT at outside facility Risk Factors: IV drug use. Comparison Study: 05/17/2020- bilateral upper extremity venous duplex (from                   outside facility- report only)  Performing Technologist: Maudry Mayhew MHA, RDMS, RVT, RDCS  Examination Guidelines: A complete evaluation includes B-mode imaging, spectral Doppler, color Doppler, and power Doppler as needed of all accessible portions of each vessel. Bilateral testing is considered an integral part of a complete examination. Limited examinations for reoccurring indications may be performed as noted.  Right Findings: +----------+------------+---------+-----------+----------+-------+ RIGHT     CompressiblePhasicitySpontaneousPropertiesSummary +----------+------------+---------+-----------+----------+-------+ Subclavian                         No                Acute  +----------+------------+---------+-----------+----------+-------+  Left Findings: +----------+------------+---------+-----------+----------+-------+ LEFT      CompressiblePhasicitySpontaneousPropertiesSummary +----------+------------+---------+-----------+----------+-------+ IJV           Full       Yes       Yes                      +----------+------------+---------+-----------+----------+-------+ Subclavian    Full       Yes       Yes                      +----------+------------+---------+-----------+----------+-------+ Axillary      Full       Yes       Yes                      +----------+------------+---------+-----------+----------+-------+ Brachial      Full       Yes       Yes                      +----------+------------+---------+-----------+----------+-------+ Radial        Full                                          +----------+------------+---------+-----------+----------+-------+ Ulnar         Full                                          +----------+------------+---------+-----------+----------+-------+ Cephalic      Full                                          +----------+------------+---------+-----------+----------+-------+ Basilic     Partial                                  Chronic +----------+------------+---------+-----------+----------+-------+  Summary:  Right: Findings consistent with acute deep vein thrombosis involving the right subclavian vein, consistent with recent duplex at outside facility 05/17/2020.  Left: No evidence of deep vein thrombosis in the upper extremity. 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  *  See table(s) above for measurements and observations.  Diagnosing physician: Ruta Hinds MD Electronically signed by Ruta Hinds MD on 05/22/2020 at 6:26:30 PM.    Final    US Abdomen Limited RUQ (LIVER/GB)  Result Date: 05/22/2020 CLINICAL DATA:  Right upper quadrant pain. EXAM: ULTRASOUND ABDOMEN LIMITED RIGHT UPPER QUADRANT COMPARISON:  June 14, 2013. FINDINGS: Gallbladder: No gallstones or wall thickening visualized (2.4 mm). No sonographic Murphy sign noted by sonographer. Common bile duct: Diameter: 5.2 mm Liver: No focal lesion identified. Very mild diffusely increased echogenicity of the liver parenchyma is seen. Portal vein is patent on color Doppler imaging with normal direction of blood flow towards the liver. Other: None. IMPRESSION: Mild fatty infiltration of the liver. Electronically Signed   By: Virgina Norfolk M.D.   On: 05/22/2020 20:38    Zola Button, MD 05/23/2020, 1:03 PM PGY-1, [place] Intern pager: [phone], text pages welcome

## 2020-05-24 ENCOUNTER — Other Ambulatory Visit (HOSPITAL_COMMUNITY): Payer: Medicaid Other

## 2020-05-24 ENCOUNTER — Inpatient Hospital Stay (HOSPITAL_COMMUNITY): Payer: Self-pay

## 2020-05-24 LAB — COMPREHENSIVE METABOLIC PANEL
ALT: 13 U/L (ref 0–44)
AST: 14 U/L — ABNORMAL LOW (ref 15–41)
Albumin: 2.9 g/dL — ABNORMAL LOW (ref 3.5–5.0)
Alkaline Phosphatase: 58 U/L (ref 38–126)
Anion gap: 14 (ref 5–15)
BUN: 9 mg/dL (ref 6–20)
CO2: 23 mmol/L (ref 22–32)
Calcium: 9.1 mg/dL (ref 8.9–10.3)
Chloride: 105 mmol/L (ref 98–111)
Creatinine, Ser: 0.63 mg/dL (ref 0.44–1.00)
GFR, Estimated: >60 mL/min (ref >60)
Glucose, Bld: 109 mg/dL — ABNORMAL HIGH (ref 70–99)
Potassium: 3.8 mmol/L (ref 3.5–5.1)
Sodium: 142 mmol/L (ref 135–145)
Total Bilirubin: 0.9 mg/dL (ref 0.3–1.2)
Total Protein: 6.7 g/dL (ref 6.5–8.1)

## 2020-05-24 LAB — CBC
HCT: 33.4 % — ABNORMAL LOW (ref 36.0–46.0)
Hemoglobin: 10.3 g/dL — ABNORMAL LOW (ref 12.0–15.0)
MCH: 26.8 pg (ref 26.0–34.0)
MCHC: 30.8 g/dL (ref 30.0–36.0)
MCV: 87 fL (ref 80.0–100.0)
Platelets: 307 10*3/uL (ref 150–400)
RBC: 3.84 MIL/uL — ABNORMAL LOW (ref 3.87–5.11)
RDW: 16.4 % — ABNORMAL HIGH (ref 11.5–15.5)
WBC: 4.7 10*3/uL (ref 4.0–10.5)
nRBC: 0 % (ref 0.0–0.2)

## 2020-05-24 LAB — ECHOCARDIOGRAM COMPLETE: Height: 67.008 in

## 2020-05-24 MED ORDER — OXYCODONE HCL 5 MG PO TABS
5.0000 mg | ORAL_TABLET | Freq: Four times a day (QID) | ORAL | Status: DC
  Administered 2020-05-24 – 2020-05-25 (×3): 5 mg via ORAL
  Filled 2020-05-24 (×3): qty 1

## 2020-05-24 MED ORDER — LORAZEPAM 2 MG/ML IJ SOLN
1.0000 mg | Freq: Once | INTRAMUSCULAR | Status: AC
  Administered 2020-05-24: 1 mg via INTRAVENOUS
  Filled 2020-05-24: qty 1

## 2020-05-24 MED ORDER — PREGABALIN 75 MG PO CAPS
75.0000 mg | ORAL_CAPSULE | Freq: Two times a day (BID) | ORAL | Status: DC
  Administered 2020-05-24 – 2020-05-25 (×3): 75 mg via ORAL
  Filled 2020-05-24 (×3): qty 1

## 2020-05-24 MED ORDER — GADOBUTROL 1 MMOL/ML IV SOLN
7.0000 mL | Freq: Once | INTRAVENOUS | Status: AC | PRN
  Administered 2020-05-24: 7 mL via INTRAVENOUS

## 2020-05-24 NOTE — Progress Notes (Signed)
Patient ID: Sara Bradshaw, female   DOB: 1991/02/02, 29 y.o.   MRN: [id number] We (neurosurgery) were asked about this pt. Dr Venetia Maxon left a consult note in June, and Dr Jordan Likes left verbal recommendations on Monday. I spoke with DR.Pool today, and I'm leaving this note in their absence. It appears she has chronic thoracic diskitis with chronic pain. I have read the medicine resident's and ID notes. Orders suggest a new thoracic MRI has been ordered. At this point it is felt she does not have a new, acute neurosurgical issue pending her new MRI. We agree with updated imaging and abx per ID and pain mgmt as per medicine team. In general, diskitis is not a neurosurgical condition unless there is an epidural process or disk space/ bony destruction with resultant instability. Please inform Dr Fredrich Birks team when the new imaging is completed or call with any questions.

## 2020-05-24 NOTE — Progress Notes (Signed)
FPTS Interim Progress Note  S: Reviewed patient this evening with Dr. Idalia Needle after receiving a page from RN that patient had uncontrolled thoracic back pain.  Patient was lying in bed, uncomfortable and requesting stronger analgesia.  Reports her chronic back pain was worsening and Tylenol did not help.  She has also been feeling feverish  O: BP 113/75 (BP Location: Left Arm)   Pulse 89   Temp 98.5 F (36.9 C) (Oral)   Resp 18   Ht 5' 7.01" (1.702 m)   Wt 73.4 kg   LMP 07/10/2014   SpO2 98%   BMI 25.34 kg/m    General: Alert, moderate distress due to pain Cardio: Well-perfused Pulm:  Normal respiratory effort MSK: Significant tenderness on palpation of mid thoracic region.  This is due to see her up with orders in extremities: No peripheral edema.  5/5 strength upper and lower extremities, normal sensation throughout Neuro: Cranial nerves grossly intact  A/P: Plan per day team.  For analgesia will add on [place] 2.5 mg every 4 as needed.  Continue scheduled 1 g Tylenol every 6 hourly  Towanda Octave, MD 05/24/2020, 1:58 AM PGY-2, [facility] Family Medicine Service pager [phone]

## 2020-05-24 NOTE — Progress Notes (Signed)
[facility]  Date of Admission:  05/22/2020   Total days of antibiotics 3        Day 3 daptomycin        S/p oritavancin 10/26        ASSESSMENT: Sara Bradshaw is 28yo female with history IV drug use, bipolar one disorder admitted for osteomyelitis/discitis with incomplete treatment d/t leaving AMA multiple times over the last few months. Was not able to get TTE yesterday, will obtain today. Continue with daptomycin, received dose of oritavancin yesterday. Will try to encourage patient to stay inpatient during treatment. Hep C RNA quant negative.  PLAN: 1. C/w daptomycin 2. TTE today 3. Encouraging patient to stay in hospital for treatment and stress importance of follow-up appointments  Active Problems:   Osteomyelitis (HCC)   Acute deep vein thrombosis (DVT) of right upper extremity (HCC)   Discitis of thoracic region  Scheduled Meds: . acetaminophen  1,000 mg Oral Q6H  . cloNIDine  0.1 mg Oral BID  . diclofenac Sodium  4 g Topical QID  . enoxaparin (LOVENOX) injection  70 mg Subcutaneous Q12H  . nicotine  21 mg Transdermal Daily  . pregabalin  75 mg Oral BID  . senna  1 tablet Oral Daily   Continuous Infusions: . DAPTOmycin (CUBICIN)  IV Stopped (05/23/20 2344)   PRN Meds:.oxyCODONE  SUBJECTIVE: Patient endorses continued pain this morning. O/N added oxy 2.5mg  q4 prn.  Review of Systems: Other systems negative.  Allergies  Allergen Reactions  . Azithromycin Hives  . Ciprofloxacin Hives  . Menthol     Other reaction(s): Unknown  . Morphine And Related Hives and Other (See Comments)    "it was really bad"  . Prednisone Other (See Comments)    "makes face get red and feel hot"  . Vancomycin Hives  . Ceftriaxone Rash    generalized  . Lidocaine Rash    Localized with topical lidocaine  . Penicillins Other (See Comments)     Has patient had a PCN reaction causing immediate rash, facial/tongue/throat swelling, SOB or  lightheadedness with hypotension: yes Has patient had a PCN reaction causing severe rash involving mucus membranes or skin necrosis:  no Has patient had a PCN reaction that required hospitalization: no Has patient had a PCN reaction occurring within the last 10 years: yes If all of the above answers are "NO", then may proceed with Cephalosporin use.    OBJECTIVE: Vitals:   05/22/20 2107 05/23/20 0000 05/23/20 0400 05/23/20 0931  BP:  125/78 130/80 113/75  Pulse: 98 90 87 89  Resp: 17 18  18   Temp: 98 F (36.7 C) 97.8 F (36.6 C) 97.7 F (36.5 C) 98.5 F (36.9 C)  TempSrc: Oral Oral Oral Oral  SpO2: 99% 99% 100% 98%  Weight:      Height:       Body mass index is 25.34 kg/m.  Physical Exam: General: Ill-appearing, under blanket HENT: Normocephalic, atraumatic Eyes: anicteric  Neuro: Awake, alert, oriented x3. Psych: Agitated, impaired judgement  Lab Results Lab Results  Component Value Date   WBC 4.7 05/24/2020   HGB 10.3 (L) 05/24/2020   HCT 33.4 (L) 05/24/2020   MCV 87.0 05/24/2020   PLT 307 05/24/2020    Lab Results  Component Value Date   CREATININE 0.63 05/24/2020   BUN 9 05/24/2020   NA 142 05/24/2020   K 3.8 05/24/2020   CL 105 05/24/2020   CO2  23 05/24/2020    Lab Results  Component Value Date   ALT 13 05/24/2020   AST 14 (L) 05/24/2020   ALKPHOS 58 05/24/2020   BILITOT 0.9 05/24/2020     Microbiology: Recent Results (from the past 240 hour(s))  Blood culture (routine x 2)     Status: None (Preliminary result)   Collection Time: 05/22/20 12:24 PM   Specimen: BLOOD  Result Value Ref Range Status   Specimen Description BLOOD SITE NOT SPECIFIED  Final   Special Requests   Final    BOTTLES DRAWN AEROBIC AND ANAEROBIC Blood Culture adequate volume   Culture   Final    NO GROWTH 2 DAYS Performed at [facility], [address] [place], [place] [address]    Report Status PENDING  Incomplete  Respiratory Panel by RT PCR (Flu A&B, Covid) -  Nasopharyngeal Swab     Status: None   Collection Time: 05/22/20  3:50 PM   Specimen: Nasopharyngeal Swab  Result Value Ref Range Status   SARS Coronavirus 2 by RT PCR NEGATIVE NEGATIVE Final    Comment: (NOTE) SARS-CoV-2 target nucleic acids are NOT DETECTED.  The SARS-CoV-2 RNA is generally detectable in upper respiratoy specimens during the acute phase of infection. The lowest concentration of SARS-CoV-2 viral copies this assay can detect is 131 copies/mL. A negative result does not preclude SARS-Cov-2 infection and should not be used as the sole basis for treatment or other patient management decisions. A negative result may occur with  improper specimen collection/handling, submission of specimen other than nasopharyngeal swab, presence of viral mutation(s) within the areas targeted by this assay, and inadequate number of viral copies (<131 copies/mL). A negative result must be combined with clinical observations, patient history, and epidemiological information. The expected result is Negative.  Fact Sheet for Patients:  https://www.moore.com/  Fact Sheet for Healthcare Providers:  https://www.young.biz/  This test is no t yet approved or cleared by the [place] FDA and  has been authorized for detection and/or diagnosis of SARS-CoV-2 by FDA under an Emergency Use Authorization (EUA). This EUA will remain  in effect (meaning this test can be used) for the duration of the COVID-19 declaration under Section 564(b)(1) of the Act, 21 U.S.C. section 360bbb-3(b)(1), unless the authorization is terminated or revoked sooner.     Influenza A by PCR NEGATIVE NEGATIVE Final   Influenza B by PCR NEGATIVE NEGATIVE Final    Comment: (NOTE) The Xpert Xpress SARS-CoV-2/FLU/RSV assay is intended as an aid in  the diagnosis of influenza from Nasopharyngeal swab specimens and  should not be used as a sole basis for treatment. Nasal washings and    aspirates are unacceptable for Xpert Xpress SARS-CoV-2/FLU/RSV  testing.  Fact Sheet for Patients: https://www.moore.com/  Fact Sheet for Healthcare Providers: https://www.young.biz/  This test is not yet approved or cleared by the [place] FDA and  has been authorized for detection and/or diagnosis of SARS-CoV-2 by  FDA under an Emergency Use Authorization (EUA). This EUA will remain  in effect (meaning this test can be used) for the duration of the  Covid-19 declaration under Section 564(b)(1) of the Act, 21  U.S.C. section 360bbb-3(b)(1), unless the authorization is  terminated or revoked. Performed at [facility], [address] [place], [place] [address]   Blood culture (routine x 2)     Status: None (Preliminary result)   Collection Time: 05/23/20  1:37 AM   Specimen: BLOOD  Result Value Ref Range  Status   Specimen Description BLOOD RIGHT ARM  Final   Special Requests   Final    BOTTLES DRAWN AEROBIC AND ANAEROBIC Blood Culture adequate volume   Culture   Final    NO GROWTH 1 DAY Performed at [facility], [address] [place], [place] [address]    Report Status PENDING  Incomplete    Evlyn Kanner, MD Internal Medicine PGY-1 [phone]

## 2020-05-24 NOTE — Progress Notes (Signed)
Family Medicine Teaching Service Daily Progress Note Intern Pager: [phone]  Patient name: Sara Bradshaw Medical record number: [id number] Date of birth: 1991-07-28 Age: 29 y.o. Gender: female  Primary Care Provider: Patient, No Pcp Per Consultants: ID, neurosurgery   Code Status: Full Code  Pt Overview and Major Events to Date:  10/25 Admitted  Assessment and Plan: Sara Bradshaw is a 29 y.o. female who presents with back pain secondary to known osteomyelitis/discitis. PMHx significant for: substance use disorder (IVDU), PTSD.  Osteomyelitis / Discitis Presented with severe back pain. Evidence of osteomyelitis/discitis at T7-8 from previous MRI months ago, has been in and out of hospitals due to leaving [facility] and has not completed full antibiotic course. She did have MRSA bacteremia during that hospitalization, which did resolve prior to leaving AMA, so likely pathogen is MRSA. Restarted on daptomycin on admission here. Neurosurgery and ID consulted in the ED, appreciate recommendations. Inflammatory markers after abx: CRP 2.5, ESR 52. Pain not well controlled, will try to optimize non-opioid treatments given her history of substance use disorder. - continue daptomycin (10/25-) - s/p oritavancin (10/26) per CCM - scheduled Tylenol - pregabalin 75 mg BID - diclofenac gel - oxycodone 2.5 mg q4h prn for breakthrough - TTE given recent history of bacteremia  DVT, right upper extremity Previously noted to have occlusive right subclavian thrombus and left upper extremity superficial occlusive thrombus while at [facility]. She was receiving AC while admitted, but has not been on [facility] since she left. 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 - enoxaparin, plan to transition to [place] if no planned procedures  Hepatitis C antibody positive Noted on acute hepatitis panel. Hep C RNA quant negative, which suggest no  active infection. 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 UDS positive for opiates and amphetamines. Denies drug use in the past year, suspect still using.  - monitor COWS  Tobacco use 1 ppd smoker for the past 16 years. - nicotine patch  RUQ pain Appears to be more bilateral rib pain, likely related to osteomyelitis.  FEN/GI: regular diet PPx: enoxaparin treatment dose  Disposition: med-surg  Subjective:  This morning, patient still reporting severe back pain. Itching resolved after IV diphenhydramine yesterday.  Objective: Temp:  [98.5 F (36.9 C)] 98.5 F (36.9 C) (10/26 0931) Pulse Rate:  [89] 89 (10/26 0931) Resp:  [18] 18 (10/26 0931) BP: (113)/(75) 113/75 (10/26 0931) SpO2:  [98 %] 98 % (10/26 0931) Physical Exam: General: Young female, appears in pain Cardiovascular: RRR, no murmur Respiratory: CTAB Abdomen: soft, non-tender, +BS MSK: TTP bilateral lower ribs Extremities: WWP, no edema  Laboratory: Recent Labs  Lab 05/22/20 1223 05/23/20 0137 05/24/20 0330  WBC 7.1 6.2 4.7  HGB 11.1* 10.0* 10.3*  HCT 36.5 32.4* 33.4*  PLT 385 297 307   Recent Labs  Lab 05/22/20 1223 05/23/20 0137 05/24/20 0330  NA 139 136 142  K 4.2 3.7 3.8  CL 99 101 105  CO2 23 26 23   BUN 11 10 9   CREATININE 0.78 0.61 0.63  CALCIUM 9.5 8.8* 9.1  PROT 8.0 6.5 6.7  BILITOT 0.6 0.8 0.9  ALKPHOS 64 63 58  ALT 19 14 13   AST 19 17 14*  GLUCOSE 117* 113* 109*    Imaging/Diagnostic Tests: No new imaging.  Zola Button, MD 05/24/2020, 7:53 AM PGY-1, [place] Intern pager: [phone],  text pages welcome

## 2020-05-24 NOTE — Progress Notes (Signed)
Patient ID: Sara Bradshaw, female   DOB: 26-Jul-1991, 29 y.o.   MRN: [id number]  The patient's MRI findings are unchanged from her previous scan. She is noncompliant with antibiotic therapy. She needs an ID consult and to complete IV antibiotic therapy. No further neurosurgical evaluation is needed.   [name], DNP, NP-C

## 2020-05-25 ENCOUNTER — Inpatient Hospital Stay (HOSPITAL_COMMUNITY): Payer: Self-pay

## 2020-05-25 MED ORDER — OXYCODONE HCL 5 MG PO TABS
10.0000 mg | ORAL_TABLET | Freq: Four times a day (QID) | ORAL | Status: DC
  Administered 2020-05-25: 10 mg via ORAL
  Filled 2020-05-25: qty 2

## 2020-05-25 MED ORDER — TRAZODONE HCL 50 MG PO TABS: 50.0000 mg | ORAL_TABLET | Freq: Every day | ORAL | Status: DC

## 2020-05-25 MED ORDER — DOXYCYCLINE HYCLATE 100 MG PO TABS: 100.0000 mg | ORAL_TABLET | Freq: Two times a day (BID) | ORAL | 0 refills | Status: AC

## 2020-05-25 MED ORDER — IBUPROFEN 200 MG PO TABS
600.0000 mg | ORAL_TABLET | Freq: Three times a day (TID) | ORAL | Status: DC
  Administered 2020-05-25: 600 mg via ORAL
  Filled 2020-05-25: qty 3

## 2020-05-25 NOTE — CV Procedure (Signed)
Echocardiogram attempted at 9:15 AM. Patient refused, not giving a valid reason, just go away.   Leta Jungling RDCS

## 2020-05-25 NOTE — Progress Notes (Signed)
Patient standing in hallway.  Complaining of "being bored".  Asking for her medications at this time.  Was not wanting to take them earlier.

## 2020-05-25 NOTE — Plan of Care (Signed)
°  Problem: Education: Goal: Knowledge of General Education information will improve Description: Including pain rating scale, medication(s)/side effects and non-pharmacologic comfort measures Outcome: Not Applicable   Problem: Health Behavior/Discharge Planning: Goal: Ability to manage health-related needs will improve Outcome: Not Applicable   Problem: Clinical Measurements: Goal: Ability to maintain clinical measurements within normal limits will improve Outcome: Not Applicable Goal: Will remain free from infection Outcome: Not Applicable Goal: Diagnostic test results will improve Outcome: Not Applicable Goal: Respiratory complications will improve Outcome: Not Applicable Goal: Cardiovascular complication will be avoided Outcome: Not Applicable   Problem: Coping: Goal: Level of anxiety will decrease Outcome: Not Applicable   Problem: Pain Managment: Goal: General experience of comfort will improve Outcome: Not Applicable   Problem: Safety: Goal: Ability to remain free from injury will improve Outcome: Not Applicable   Problem: Skin Integrity: Goal: Risk for impaired skin integrity will decrease Outcome: Not Applicable   Patient left AMA.  Refused to take appropriate antibiotic medication with her.  MD notified.

## 2020-05-25 NOTE — Progress Notes (Signed)
Pharmacy Antibiotic Note  Sara Bradshaw is a 29 y.o. 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  Pharmacy has been consulted for daptomycin dosing. Pt has extensive abx history from multiple admissions dating back to 01/2020 for this spinal osteomyelitis and associated MRSA bacteremia. Pt has left AMA multiple times from different facilities for a variety of reasons and therefore has not completed treatment. Pt has documented history of allergy to vancomycin with hives reaction (per [facility] records).   ID is following this patient--gave patient 1x dose of oritavancin due to high risk of elopement. Continuing daptomycin. 10/25 CK wnl. Renal function is stable.    Plan: Daptomycin 8mg /kg (590mg ) IV q24h  Monitor weekly CK, renal function, clinical progress, and micro data  Height: 5' 7.01" (170.2 cm) Weight: 73.4 kg (161 lb 13.1 oz) IBW/kg (Calculated) : 61.62  Temp (24hrs), Avg:98.9 F (37.2 C), Min:98.9 F (37.2 C), Max:98.9 F (37.2 C)  Recent Labs  Lab 05/22/20 1223 05/23/20 0137 05/24/20 0330  WBC 7.1 6.2 4.7  CREATININE 0.78 0.61 0.63  LATICACIDVEN 1.6 1.4  --     Estimated Creatinine Clearance: 100.9 mL/min (by C-G formula based on SCr of 0.63 mg/dL).    Allergies  Allergen Reactions  . Azithromycin Hives  . Ciprofloxacin Hives  . Menthol     Other reaction(s): Unknown  . Morphine And Related Hives and Other (See Comments)    "it was really bad"  . Prednisone Other (See Comments)    "makes face get red and feel hot"  . Vancomycin Hives  . Ceftriaxone Rash    generalized  . Lidocaine Rash    Localized with topical lidocaine  . Penicillins Other (See Comments)     Has patient had a PCN reaction causing immediate rash, facial/tongue/throat swelling, SOB or lightheadedness with hypotension: yes Has patient had a PCN reaction causing severe rash involving mucus membranes or skin necrosis:  no Has patient had a PCN reaction that required  hospitalization: no Has patient had a PCN reaction occurring within the last 10 years: yes If all of the above answers are "NO", then may proceed with Cephalosporin use.     Antimicrobials this admission: Daptomycin 10/25> Oritavancin 10/26 x1   Microbiology results: 10/25 Bcx: NGTD, pending 10/16 Bcx x2: MRSA ([facility] records) 10/14 Bcx x4: MRSA ([facility] records)   Thank you for allowing pharmacy to be a part of this patient's care.  11/16  PGY1 Pharmacy Resident 05/25/2020 9:45 AM

## 2020-05-25 NOTE — Progress Notes (Signed)
Charge nurse found pt walking down hall pushing her wheelchair with all of her belongings in it. Pt had self removed IV. RN attempted to convince pt to stay but she refused. She did sign AMA papers. MD made aware.

## 2020-05-25 NOTE — Progress Notes (Signed)
Family Medicine Teaching Service Daily Progress Note Intern Pager: [phone]  Patient name: Sara Bradshaw Medical record number: [id number] Date of birth: [birth year] Age: 29 y.o. Gender: female  Primary Care Provider: Patient, No Pcp Per Consultants: ID   Code Status: Full Code  Pt Overview and Major Events to Date:  10/25 Admitted  Assessment and Plan: Kaislyn Gulas is a 29 y.o. female who presents with back pain secondary to known osteomyelitis/discitis. PMHx significant for: substance use disorder (IVDU), PTSD.  Osteomyelitis / Discitis Evidence of osteomyelitis/discitis at T7-8 from previous MRI months ago, stable on repeat MRI this admission. MRSA bacteremia noted at recent hospitalization at [facility], resolved prior to discharge. Pain not well controlled, will try to optimize non-opioid treatments given her history of substance use disorder. - continue daptomycin (10/25-) - s/p oritavancin (10/26) per CCM - scheduled Tylenol 1000 mg q6h - pregabalin 75 mg BID - diclofenac gel - scheduled oxycodone 5 mg q6h - consider ketorolac injection  - trazodone qhs for sleep - TTE given recent history of bacteremia  DVT, right upper extremity Previously noted to have occlusive right subclavian thrombus and left upper extremity superficial occlusive thrombus while at [facility]. She was receiving AC while admitted, but has not been on [facility] since she left. 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 - enoxaparin, consider transition to DOAC  Hepatitis C antibody positive Noted on acute hepatitis panel. Hep C RNA quant negative, which suggest no active infection. 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 UDS positive for opiates and amphetamines. Denies drug use in the past year, suspect still using.  - monitor COWS  Tobacco use 1 ppd smoker for the past 16  years. - nicotine patch  FEN/GI: regular diet PPx: enoxaparin treatment dose  Disposition: med-surg  Subjective:  This morning, patient still reporting severe back pain. Had difficulty sleeping due to pain. Did not want to be disturbed, wanting to sleep.  Objective: Temp:  [98.5 F (36.9 C)-98.9 F (37.2 C)] 98.9 F (37.2 C) (10/27 2000) Pulse Rate:  [74] 74 (10/27 2000) Resp:  [17] 17 (10/27 2000) BP: (136)/(60) 136/60 (10/27 2000) SpO2:  [100 %] 100 % (10/27 2000) Physical Exam: General: Young female, appears in pain Cardiovascular: RRR, no murmur Respiratory: CTAB  Laboratory: Recent Labs  Lab 05/22/20 1223 05/23/20 0137 05/24/20 0330  WBC 7.1 6.2 4.7  HGB 11.1* 10.0* 10.3*  HCT 36.5 32.4* 33.4*  PLT 385 297 307   Recent Labs  Lab 05/22/20 1223 05/23/20 0137 05/24/20 0330  NA 139 136 142  K 4.2 3.7 3.8  CL 99 101 105  CO2 23 26 23   BUN 11 10 9   CREATININE 0.78 0.61 0.63  CALCIUM 9.5 8.8* 9.1  PROT 8.0 6.5 6.7  BILITOT 0.6 0.8 0.9  ALKPHOS 64 63 58  ALT 19 14 13   AST 19 17 14*  GLUCOSE 117* 113* 109*    Imaging/Diagnostic Tests: MR THORACIC SPINE W WO CONTRAST  Result Date: 05/24/2020 CLINICAL DATA:  Cord compression.  Discitis/osteomyelitis. EXAM: MRI THORACIC WITHOUT AND WITH CONTRAST TECHNIQUE: Multiplanar and multiecho pulse sequences of the thoracic spine were obtained without and with intravenous contrast. CONTRAST:  70mL GADAVIST GADOBUTROL 1 MMOL/ML IV SOLN COMPARISON:  MRI thoracic spine 05/12/2020 FINDINGS: MRI THORACIC SPINE FINDINGS Alignment: Physiologic. Vertebrae: There is similar marrow edema and endplate erosion involving the T7-T8 disc with  T2 hyperintensity of the disc. No progressive height loss. There is similar edema involving the posterior elements. Cord: Cord signal is normal. Enhancing epidural infection on the left at T7-T8 is similar to prior with similar canal stenosis. Similar mild dorsal epidural enhancement. Paraspinal and  other soft tissues: Slightly improved paraspinous soft tissue thickening around the T7-T8 level, compatible with phlegmon. Previously-seen right paraspinous nonenhancing abscess has resolved. Disc levels: Discitis/osteomyelitis at T7-T8, as detailed above. No evidence of discitis at other levels. IMPRESSION: 1. Similar findings of discitis/osteomyelitis at T7-T8 without evidence of progressive bone destruction. Similar extension of edema into the posterior elements. 2. Similar epidural infection in the left eccentric canal at T7-T8 with similar canal stenosis. 3. Slightly improved paraspinous phlegmon about T7-T8 with resolution of the previously seen right paraspinous abscess. Electronically Signed   By: Feliberto Harts MD   On: 05/24/2020 16:02    Littie Deeds, MD 05/25/2020, 8:38 AM PGY-1, [facility] Family Medicine FPTS Intern pager: [phone], text pages welcome

## 2020-05-25 NOTE — Hospital Course (Addendum)
Sara Bradshaw is a 29 y.o. female with a history of IVDU and PTSD who presented with back pain secondary to known osteomyelitis/discitis infection (has left AMA from multiple hospitals prior to completing treatment). Hospital course outlined by problem below:  Osteomyelitis / Discitis  Initially noted to have evidence of osteomyelitis and discitis at T7-8 and small paraspinal abscess of the thoracic region on MRI during hospitalization in July in which patient left AMA.  Most recently, she was seen at [facility] earlier this month for this issue and was treated with daptomycin before leaving AMA.  She did have blood cultures growing MRSA on 10/16 while at [facility], then no growth on 10/18.  Followed by ID during hospitalization.  Neurosurgery also consulted, no neurosurgical interventions recommended.  She was continued on IV daptomycin while hospitalized here and received one dose of oritavancin on 10/26.  MRI thoracic spine was repeated during admission, which was stable from previous.  Pain was difficult to control.  She was on scheduled Tylenol, scheduled oxycodone 5 mg every 6 hours (increased to 10 mg every 6 hours just prior to leaving AMA), pregabalin 75 mg twice a day, diclofenac gel.  She was started on scheduled ibuprofen just prior to leaving AMA.  DVT, right upper extremity Initially noted during previous hospitalization at [facility], confirmed to have right subclavian DVT on ultrasound here.  She was treated with enoxaparin during admission with plan to transition to DOAC; however, patient left AMA prior to transition.  Hepatitis C antibody positive Noted on acute hepatitis panel. No RUQ tenderness and no transaminitis. Hep C RNA quant negative.  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 UDS positive for opiates and amphetamines. COWs were monitored, scores between 0-2. Clonidine was started to prevent potential withdrawal  symptoms.

## 2020-05-25 NOTE — TOC CAGE-AID Note (Signed)
Transition of Care [facility]) - CAGE-AID Screening   Patient Details  Name: Sara Bradshaw MRN: [id number] Date of Birth: [birth year]  Transition of Care [facility]) CM/SW Contact:    Emeterio Reeve, [place] Phone Number: 05/25/2020, 1:14 PM   Clinical Narrative:  CSW met with pt at bedside. CSW introduced self and explained her role at the hospital.  Pt did not want to answer assessment questions about substance use. CSW offered resources and pt declined. This was the second attempt to talk to pt about substance use.   CAGE-AID Screening: Substance Abuse Screening unable to be completed due to: : Patient Refused                  Emeterio Reeve, [place], [place] Social Worker [phone]

## 2020-05-25 NOTE — Discharge Summary (Signed)
Family Medicine Teaching Service [facility] Summary  Patient name: Sara Bradshaw Medical record number: [id number] Date of birth: 1991-01-01 Age: 29 y.o. Gender: female Date of Admission: 05/22/2020  Date left AMA: 05/25/2020  Admitting Physician: Sandre Kitty, MD  Primary Care Provider: Patient, No Pcp Per Consultants: ID  Indication for Hospitalization: Osteomyelitis  Diagnoses/Problem List:  Active Problems:   Osteomyelitis (HCC)   Acute deep vein thrombosis (DVT) of right upper extremity (HCC)   Discitis of thoracic region   Substance use disorder   Tobacco use   Hepatitis C antibody positive  Disposition: left AMA  Condition: Stable  Exam:  General: Young female, lying in bed, appears in pain Cardiovascular: RRR, no murmur Respiratory: CTAB Psych: agitated  Brief Hospital Course:  Sara Bradshaw is a 29 y.o. female with a history of IVDU and PTSD who presented with back pain secondary to known osteomyelitis/discitis infection (has left AMA from multiple hospitals prior to completing treatment). Hospital course outlined by problem below:  Osteomyelitis / Discitis  Initially noted to have evidence of osteomyelitis and discitis at T7-8 and small paraspinal abscess of the thoracic region on MRI during hospitalization in July in which patient left AMA.  Most recently, she was seen at [facility] earlier this month for this issue and was treated with daptomycin before leaving AMA.  She did have blood cultures growing MRSA on 10/16 while at [facility], then no growth on 10/18.  Followed by ID during hospitalization.  Neurosurgery also consulted, no neurosurgical interventions recommended.  She was continued on IV daptomycin while hospitalized here and received one dose of oritavancin on 10/26.  MRI thoracic spine was repeated during admission, which was stable from previous.  Pain was difficult to control.  She was on scheduled Tylenol, scheduled oxycodone 5 mg every  6 hours (increased to 10 mg every 6 hours just prior to leaving AMA), pregabalin 75 mg twice a day, diclofenac gel.  She was started on scheduled ibuprofen just prior to leaving AMA.  DVT, right upper extremity Initially noted during previous hospitalization at [facility], confirmed to have right subclavian DVT on ultrasound here.  She was treated with enoxaparin during admission with plan to transition to DOAC; however, patient left AMA prior to transition.  Hepatitis C antibody positive Noted on acute hepatitis panel. No RUQ tenderness and no transaminitis. Hep C RNA quant negative.  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 UDS positive for opiates and amphetamines. COWs were monitored, scores between 0-2. Clonidine was started to prevent potential withdrawal symptoms.   Issues for Follow Up:  1. Recommend repeat hep C RNA quant in 6 months 2. Needs anticoagulation for right upper extremity DVT 3. Needs antibiotics covering MRSA for treatment of osteomyelitis 4. Substance use disorder - recommend MAT  Significant Procedures: none  Significant Labs and Imaging:  Recent Labs  Lab 05/22/20 1223 05/23/20 0137 05/24/20 0330  WBC 7.1 6.2 4.7  HGB 11.1* 10.0* 10.3*  HCT 36.5 32.4* 33.4*  PLT 385 297 307   Recent Labs  Lab 05/22/20 1223 05/22/20 1223 05/23/20 0137 05/24/20 0330  NA 139  --  136 142  K 4.2   < > 3.7 3.8  CL 99  --  101 105  CO2 23  --  26 23  GLUCOSE 117*  --  113* 109*  BUN 11  --  10 9  CREATININE 0.78  --  0.61 0.63  CALCIUM 9.5  --  8.8* 9.1  ALKPHOS 64  --  63 58  AST 19  --  17 14*  ALT 19  --  14 13  ALBUMIN 3.7  --  2.9* 2.9*   < > = values in this interval not displayed.   Lab Results  Component Value Date   ESRSEDRATE 52 (H) 05/23/2020    Lab Results  Component Value Date   CRP 2.5 (H) 05/23/2020    MR THORACIC SPINE W WO CONTRAST  Result Date: 05/24/2020 CLINICAL DATA:  Cord  compression.  Discitis/osteomyelitis. EXAM: MRI THORACIC WITHOUT AND WITH CONTRAST TECHNIQUE: Multiplanar and multiecho pulse sequences of the thoracic spine were obtained without and with intravenous contrast. CONTRAST:  6mL GADAVIST GADOBUTROL 1 MMOL/ML IV SOLN COMPARISON:  MRI thoracic spine 05/12/2020 FINDINGS: MRI THORACIC SPINE FINDINGS Alignment: Physiologic. Vertebrae: There is similar marrow edema and endplate erosion involving the T7-T8 disc with T2 hyperintensity of the disc. No progressive height loss. There is similar edema involving the posterior elements. Cord: Cord signal is normal. Enhancing epidural infection on the left at T7-T8 is similar to prior with similar canal stenosis. Similar mild dorsal epidural enhancement. Paraspinal and other soft tissues: Slightly improved paraspinous soft tissue thickening around the T7-T8 level, compatible with phlegmon. Previously-seen right paraspinous nonenhancing abscess has resolved. Disc levels: Discitis/osteomyelitis at T7-T8, as detailed above. No evidence of discitis at other levels. IMPRESSION: 1. Similar findings of discitis/osteomyelitis at T7-T8 without evidence of progressive bone destruction. Similar extension of edema into the posterior elements. 2. Similar epidural infection in the left eccentric canal at T7-T8 with similar canal stenosis. 3. Slightly improved paraspinous phlegmon about T7-T8 with resolution of the previously seen right paraspinous abscess. Electronically Signed   By: Feliberto Harts MD   On: 05/24/2020 16:02   UE VENOUS DUPLEX ([facility] 7 am - 7 pm)  Result Date: 05/22/2020 UPPER VENOUS STUDY  Indications: bruising left medial upper arm, history of recent right upper extremity DVT at outside facility Risk Factors: IV drug use. Comparison Study: 05/17/2020- bilateral upper extremity venous duplex (from                   outside facility- report only) Performing Technologist: Gertie Fey MHA, RDMS, RVT, RDCS   Examination Guidelines: A complete evaluation includes B-mode imaging, spectral Doppler, color Doppler, and power Doppler as needed of all accessible portions of each vessel. Bilateral testing is considered an integral part of a complete examination. Limited examinations for reoccurring indications may be performed as noted.  Right Findings: +----------+------------+---------+-----------+----------+-------+ RIGHT     CompressiblePhasicitySpontaneousPropertiesSummary +----------+------------+---------+-----------+----------+-------+ Subclavian                         No                Acute  +----------+------------+---------+-----------+----------+-------+  Left Findings: +----------+------------+---------+-----------+----------+-------+ LEFT      CompressiblePhasicitySpontaneousPropertiesSummary +----------+------------+---------+-----------+----------+-------+ IJV           Full       Yes       Yes                      +----------+------------+---------+-----------+----------+-------+ Subclavian    Full       Yes       Yes                      +----------+------------+---------+-----------+----------+-------+ Axillary  Full       Yes       Yes                      +----------+------------+---------+-----------+----------+-------+ Brachial      Full       Yes       Yes                      +----------+------------+---------+-----------+----------+-------+ Radial        Full                                          +----------+------------+---------+-----------+----------+-------+ Ulnar         Full                                          +----------+------------+---------+-----------+----------+-------+ Cephalic      Full                                          +----------+------------+---------+-----------+----------+-------+ Basilic     Partial                                 Chronic  +----------+------------+---------+-----------+----------+-------+  Summary:  Right: Findings consistent with acute deep vein thrombosis involving the right subclavian vein, consistent with recent duplex at outside facility 05/17/2020.  Left: No evidence of deep vein thrombosis in the upper extremity. 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  *See table(s) above for measurements and observations.  Diagnosing physician: Fabienne Bruns MD Electronically signed by Fabienne Bruns MD on 05/22/2020 at 6:26:30 PM.    Final    US Abdomen Limited RUQ (LIVER/GB)  Result Date: 05/22/2020 CLINICAL DATA:  Right upper quadrant pain. EXAM: ULTRASOUND ABDOMEN LIMITED RIGHT UPPER QUADRANT COMPARISON:  June 14, 2013. FINDINGS: Gallbladder: No gallstones or wall thickening visualized (2.4 mm). No sonographic Murphy sign noted by sonographer. Common bile duct: Diameter: 5.2 mm Liver: No focal lesion identified. Very mild diffusely increased echogenicity of the liver parenchyma is seen. Portal vein is patent on color Doppler imaging with normal direction of blood flow towards the liver. Other: None. IMPRESSION: Mild fatty infiltration of the liver. Electronically Signed   By: Aram Candela M.D.   On: 05/22/2020 20:38     Results/Tests Pending: none    Littie Deeds, MD 05/25/2020, 1:26 PM PGY-1, [facility]

## 2020-05-27 LAB — CULTURE, BLOOD (ROUTINE X 2)
Culture: NO GROWTH
Special Requests: ADEQUATE

## 2020-05-28 LAB — CULTURE, BLOOD (ROUTINE X 2)
Culture: NO GROWTH
Special Requests: ADEQUATE

## 2020-11-26 DEATH — deceased
